# Patient Record
Sex: Male | Born: 1940
Health system: Southern US, Community
[De-identification: ages and names within clinical notes are randomized; demographics above are authoritative.]

## PROBLEM LIST (undated history)

## (undated) DIAGNOSIS — Z789 Other specified health status: Secondary | ICD-10-CM

## (undated) DIAGNOSIS — Z95828 Presence of other vascular implants and grafts: Secondary | ICD-10-CM

## (undated) DIAGNOSIS — I2510 Atherosclerotic heart disease of native coronary artery without angina pectoris: Secondary | ICD-10-CM

## (undated) HISTORY — PX: NO PAST SURGERIES: SHX2092

---

## 1998-10-02 ENCOUNTER — Emergency Department (HOSPITAL_COMMUNITY): Admission: EM | Admit: 1998-10-02 | Discharge: 1998-10-03 | Payer: Self-pay | Admitting: Emergency Medicine

## 1998-10-02 ENCOUNTER — Encounter: Payer: Self-pay | Admitting: Emergency Medicine

## 1999-08-24 ENCOUNTER — Emergency Department (HOSPITAL_COMMUNITY): Admission: EM | Admit: 1999-08-24 | Discharge: 1999-08-24 | Payer: Self-pay | Admitting: Emergency Medicine

## 1999-10-07 ENCOUNTER — Encounter: Payer: Self-pay | Admitting: Family Medicine

## 1999-10-07 ENCOUNTER — Encounter: Admission: RE | Admit: 1999-10-07 | Discharge: 1999-10-07 | Payer: Self-pay | Admitting: Family Medicine

## 2000-03-14 ENCOUNTER — Emergency Department (HOSPITAL_COMMUNITY): Admission: EM | Admit: 2000-03-14 | Discharge: 2000-03-15 | Payer: Self-pay

## 2000-05-21 ENCOUNTER — Emergency Department (HOSPITAL_COMMUNITY): Admission: EM | Admit: 2000-05-21 | Discharge: 2000-05-21 | Payer: Self-pay

## 2000-09-20 ENCOUNTER — Emergency Department (HOSPITAL_COMMUNITY): Admission: EM | Admit: 2000-09-20 | Discharge: 2000-09-20 | Payer: Self-pay | Admitting: Emergency Medicine

## 2001-10-17 ENCOUNTER — Encounter: Payer: Self-pay | Admitting: Emergency Medicine

## 2001-10-17 ENCOUNTER — Emergency Department (HOSPITAL_COMMUNITY): Admission: EM | Admit: 2001-10-17 | Discharge: 2001-10-17 | Payer: Self-pay | Admitting: Emergency Medicine

## 2002-02-05 ENCOUNTER — Emergency Department (HOSPITAL_COMMUNITY): Admission: EM | Admit: 2002-02-05 | Discharge: 2002-02-06 | Payer: Self-pay | Admitting: Emergency Medicine

## 2002-02-06 ENCOUNTER — Encounter: Payer: Self-pay | Admitting: Emergency Medicine

## 2002-12-10 ENCOUNTER — Emergency Department (HOSPITAL_COMMUNITY): Admission: EM | Admit: 2002-12-10 | Discharge: 2002-12-11 | Payer: Self-pay | Admitting: Emergency Medicine

## 2002-12-10 ENCOUNTER — Encounter: Payer: Self-pay | Admitting: Emergency Medicine

## 2003-03-28 ENCOUNTER — Emergency Department (HOSPITAL_COMMUNITY): Admission: EM | Admit: 2003-03-28 | Discharge: 2003-03-28 | Payer: Self-pay | Admitting: Emergency Medicine

## 2004-02-22 ENCOUNTER — Emergency Department (HOSPITAL_COMMUNITY): Admission: EM | Admit: 2004-02-22 | Discharge: 2004-02-22 | Payer: Self-pay

## 2004-07-31 ENCOUNTER — Emergency Department (HOSPITAL_COMMUNITY): Admission: EM | Admit: 2004-07-31 | Discharge: 2004-07-31 | Payer: Self-pay | Admitting: Emergency Medicine

## 2004-09-08 ENCOUNTER — Ambulatory Visit: Payer: Self-pay | Admitting: Internal Medicine

## 2004-12-08 ENCOUNTER — Ambulatory Visit: Payer: Self-pay | Admitting: Internal Medicine

## 2007-06-19 ENCOUNTER — Encounter: Admission: RE | Admit: 2007-06-19 | Discharge: 2007-06-19 | Payer: Self-pay | Admitting: Family Medicine

## 2008-03-09 ENCOUNTER — Emergency Department (HOSPITAL_COMMUNITY): Admission: EM | Admit: 2008-03-09 | Discharge: 2008-03-09 | Payer: Self-pay | Admitting: Emergency Medicine

## 2010-08-26 ENCOUNTER — Emergency Department (HOSPITAL_COMMUNITY)
Admission: EM | Admit: 2010-08-26 | Discharge: 2010-08-27 | Payer: Self-pay | Source: Home / Self Care | Admitting: Emergency Medicine

## 2010-12-07 LAB — POCT CARDIAC MARKERS: Troponin i, poc: <0.05 ng/mL (ref 0.00–0.09)

## 2010-12-08 LAB — DIFFERENTIAL
Basophils Relative: 0 % (ref 0–1)
Eosinophils Absolute: 0.4 10*3/uL (ref 0.0–0.7)
Monocytes Relative: 9 % (ref 3–12)
Neutrophils Relative %: 59 % (ref 43–77)

## 2010-12-08 LAB — COMPREHENSIVE METABOLIC PANEL
ALT: 23 U/L (ref 0–53)
Alkaline Phosphatase: 56 U/L (ref 39–117)
CO2: 26 mEq/L (ref 19–32)
Chloride: 104 mEq/L (ref 96–112)
GFR calc non Af Amer: >60 mL/min (ref >60)
Glucose, Bld: 126 mg/dL — ABNORMAL HIGH (ref 70–99)
Potassium: 5.2 mEq/L — ABNORMAL HIGH (ref 3.5–5.1)
Sodium: 138 mEq/L (ref 135–145)
Total Protein: 7.1 g/dL (ref 6.0–8.3)

## 2010-12-08 LAB — CBC
HCT: 40.1 % (ref 39.0–52.0)
Hemoglobin: 14.1 g/dL (ref 13.0–17.0)
RBC: 4.65 MIL/uL (ref 4.22–5.81)
WBC: 8.4 10*3/uL (ref 4.0–10.5)

## 2011-09-01 ENCOUNTER — Emergency Department (HOSPITAL_COMMUNITY)
Admission: EM | Admit: 2011-09-01 | Discharge: 2011-09-02 | Disposition: A | Discharge status: Home / Self Care | Payer: Medicare Other | Attending: Emergency Medicine | Admitting: Emergency Medicine

## 2011-09-01 ENCOUNTER — Encounter: Payer: Self-pay | Admitting: Emergency Medicine

## 2011-09-01 DIAGNOSIS — R079 Chest pain, unspecified: Secondary | ICD-10-CM | POA: Insufficient documentation

## 2011-09-01 DIAGNOSIS — I10 Essential (primary) hypertension: Secondary | ICD-10-CM | POA: Insufficient documentation

## 2011-09-01 DIAGNOSIS — R0789 Other chest pain: Secondary | ICD-10-CM

## 2011-09-01 DIAGNOSIS — M549 Dorsalgia, unspecified: Secondary | ICD-10-CM | POA: Insufficient documentation

## 2011-09-01 NOTE — ED Notes (Signed)
Pt alert, c/o high blood pressure, onset this evening, states hasnt been feeling right so he was checking it at home, denies chest pain, denies sob, c/o mild h/a, ambulates to triage, steady gait noted

## 2011-09-02 ENCOUNTER — Other Ambulatory Visit: Payer: Self-pay

## 2011-09-02 ENCOUNTER — Emergency Department (HOSPITAL_COMMUNITY): Payer: Medicare Other

## 2011-09-02 LAB — BASIC METABOLIC PANEL
BUN: 19 mg/dL (ref 6–23)
CO2: 27 mEq/L (ref 19–32)
Chloride: 101 mEq/L (ref 96–112)
Creatinine, Ser: 1.32 mg/dL (ref 0.50–1.35)
Glucose, Bld: 105 mg/dL — ABNORMAL HIGH (ref 70–99)

## 2011-09-02 LAB — CARDIAC PANEL(CRET KIN+CKTOT+MB+TROPI)
Relative Index: 1.8 (ref 0.0–2.5)
Troponin I: <0.3 ng/mL (ref <0.30)
Troponin I: <0.3 ng/mL (ref <0.30)

## 2011-09-02 LAB — CBC
MCH: 29.9 pg (ref 26.0–34.0)
MCV: 86.6 fL (ref 78.0–100.0)
Platelets: 344 10*3/uL (ref 150–400)
RDW: 12.3 % (ref 11.5–15.5)
WBC: 9.6 10*3/uL (ref 4.0–10.5)

## 2011-09-02 MED ORDER — NITROGLYCERIN 0.4 MG SL SUBL: 0.4000 mg | SUBLINGUAL_TABLET | SUBLINGUAL | Status: DC | PRN

## 2011-09-02 MED ORDER — METOPROLOL TARTRATE 12.5 MG HALF TABLET: 12.5000 mg | ORAL_TABLET | Freq: Two times a day (BID) | ORAL | Status: DC

## 2011-09-02 MED ORDER — METOPROLOL TARTRATE 25 MG PO TABS
12.5000 mg | ORAL_TABLET | Freq: Once | ORAL | Status: AC
  Administered 2011-09-02: 12.5 mg via ORAL
  Filled 2011-09-02 (×2): qty 1

## 2011-09-02 NOTE — ED Notes (Signed)
Pt refused lopressor. Pt told the importance of taking medication. Pt stated he thought he may get hooked on medication. Told pt that this would not happen with this medication. Md made aware.

## 2011-09-02 NOTE — ED Notes (Signed)
Pt states that he has been having reproducible back pain and some mild chest wall pain and an episode of blood pressure that was earlier this evening. States that when he took his BP it was 190/86. Bp at this time is 175/76, pulse 63, O2 100%. Patient states pain has been off and on for 2 days- that he has tried to get an appointment with his regular MD but couldn't.

## 2011-09-02 NOTE — ED Notes (Signed)
Pt here with c/o HTN. Checked his Bp at home and it was 190's systolic and he decided to come get evaluated. Wife at bedside

## 2011-09-02 NOTE — ED Provider Notes (Signed)
History     CSN: [id number] Arrival date & time: 09/01/2011 11:50 PM   First MD Initiated Contact with Patient 09/02/11 0111      Chief Complaint  Patient presents with  . Hypertension    (Consider location/radiation/quality/duration/timing/severity/associated sxs/prior treatment) HPI 70 year old male presents to emergency department with complaint of elevated blood pressure tonight. Patient reports he was not feeling well and took his blood pressure and it was 190/86. Patient denies prior history of hypertension. Patient reports he's been having upper back pain and chest pain for the last 2-3 days. Back pain chest pain occur when he conscious his shoulders forward. He does report he has had twinges of left upper chest pain not with hunching and shoulders. Patient denies prior coronary artery disease. Patient reports he braked leaves last week. He has had previous symptoms before, prior review of visits show similar presentation about a year ago for which she left the emergency department AMA. Patient reports stress test 2-3 years ago during workup of intermittent atrial fibrillation. Patient takes no medications and reports his A. fib resolved on tone. Patient sister recently had a heart catheterization and a stent placed. He had an EKG done at his doctor's office last week due to this history. Patient denies any headache dizziness change in vision or focal neuro deficits. No abdominal pain no change in urinary habits no weakness. Patient denies any leg swelling prolonged immobilization or other risk factors for PE History reviewed. No pertinent past medical history.  History reviewed. No pertinent past surgical history.  No family history on file.  History  Substance Use Topics  . Smoking status: Not on file  . Smokeless tobacco: Not on file  . Alcohol Use:       Review of Systems  All other systems reviewed and are negative.    Allergies  Review of patient's allergies  indicates no known allergies.  Home Medications   Current Outpatient Rx  Name Route Sig Dispense Refill  . METOPROLOL TARTRATE 12.5 MG HALF TABLET Oral Take 0.5 tablets (12.5 mg total) by mouth 2 (two) times daily. 30 tablet 0  . NITROGLYCERIN 0.4 MG SL SUBL Sublingual Place 1 tablet (0.4 mg total) under the tongue every 5 (five) minutes as needed for chest pain (up to three doses, if pain continues go to the ER). 15 tablet 0    BP 161/83  Pulse 73  Temp(Src) 98 F (36.7 C) (Oral)  Resp 16  Wt 180 lb (81.647 kg)  SpO2 98%  Physical Exam  Nursing note and vitals reviewed. Constitutional: He is oriented to person, place, and time. He appears well-developed and well-nourished.  HENT:  Head: Normocephalic and atraumatic.  Nose: Nose normal.  Mouth/Throat: Oropharynx is clear and moist.  Eyes: Conjunctivae and EOM are normal. Pupils are equal, round, and reactive to light.  Neck: Normal range of motion. Neck supple. No JVD present. No tracheal deviation present. No thyromegaly present.  Cardiovascular: Normal rate, regular rhythm, normal heart sounds and intact distal pulses.  Exam reveals no gallop and no friction rub.   No murmur heard. Pulmonary/Chest: Effort normal and breath sounds normal. No stridor. No respiratory distress. He has no wheezes. He has no rales. He exhibits no tenderness.  Abdominal: Soft. Bowel sounds are normal. He exhibits no distension and no mass. There is no tenderness. There is no rebound and no guarding.  Musculoskeletal: Normal range of motion. He exhibits no edema and no tenderness.  Lymphadenopathy:    He has  no cervical adenopathy.  Neurological: He is oriented to person, place, and time. He exhibits normal muscle tone. Coordination normal.  Skin: Skin is dry. No rash noted. No erythema. No pallor.  Psychiatric: He has a normal mood and affect. His behavior is normal. Judgment and thought content normal.    ED Course  Procedures (including critical  care time)  Labs Reviewed  BASIC METABOLIC PANEL - Abnormal; Notable for the following:    Potassium 3.2 (*)    Glucose, Bld 105 (*)    GFR calc non Af Amer 52 (*)    GFR calc Af Amer 61 (*)    All other components within normal limits  D-DIMER, QUANTITATIVE - Abnormal; Notable for the following:    D-Dimer, Quant 0.57 (*)    All other components within normal limits  CBC  CARDIAC PANEL(CRET KIN+CKTOT+MB+TROPI)  CARDIAC PANEL(CRET KIN+CKTOT+MB+TROPI)   Dg Chest 2 View  09/02/2011  *RADIOLOGY REPORT*  Clinical Data: Chest pain, back pain and hypertension.  CHEST - 2 VIEW  Comparison: Chest radiograph performed 08/26/2010  Findings: The lungs are well-aerated and clear.  There is no evidence of focal opacification, pleural effusion or pneumothorax. Bilateral nipple shadows are noted.  The heart is normal in size; the mediastinal contour is within normal limits.  No acute osseous abnormalities are seen.  IMPRESSION: No acute cardiopulmonary process seen.  Original Report Authenticated By: Tonia Ghent, M.D.    Date: 09/02/2011  Rate: 61  Rhythm: normal sinus rhythm  QRS Axis: normal  Intervals: normal  ST/T Wave abnormalities: normal  Conduction Disutrbances:incomplete RBBB  Narrative Interpretation:   Old EKG Reviewed: none available   1. Hypertension   2. Atypical chest pain       MDM     70 yo male with new hypertension, occasional cp which seems atypical in nature.  Ddimer slightly elevated, but pt with normal hr, no tachypnea, no hypoxia, no risk factors for PE.  D/w Dr Antoine Poche regarding htn, family h/o cad, and atypical chest pain.  He will have [facility] cardiology call for office visit, recommends lopressor and ntg prn.  Pt does not wish admission, and does not want to take medications.  Advised of results, to f/u with pcm and cardiology.     Olivia Mackie, MD 09/02/11 [phone]

## 2011-09-02 NOTE — ED Notes (Signed)
Pt discharged. Pt decided to wanted to take his lopressor . Medication given. Pt discharged in stable condition.

## 2011-09-02 NOTE — ED Notes (Signed)
Recollected blood specimens 

## 2012-01-31 DIAGNOSIS — M546 Pain in thoracic spine: Secondary | ICD-10-CM | POA: Diagnosis not present

## 2012-02-02 ENCOUNTER — Encounter (HOSPITAL_COMMUNITY): Payer: Self-pay | Admitting: Emergency Medicine

## 2012-02-02 ENCOUNTER — Encounter (HOSPITAL_COMMUNITY)
Admission: EM | Disposition: A | Discharge status: Home Health | Payer: Self-pay | Source: Home / Self Care | Attending: Cardiology

## 2012-02-02 ENCOUNTER — Ambulatory Visit (HOSPITAL_COMMUNITY): Admit: 2012-02-02 | Payer: Self-pay | Admitting: Cardiology

## 2012-02-02 ENCOUNTER — Inpatient Hospital Stay (HOSPITAL_COMMUNITY)
Admission: EM | Admit: 2012-02-02 | Discharge: 2012-02-04 | DRG: 247 | Disposition: A | Discharge status: Home / Self Care | Payer: Medicare Other | Attending: Cardiology | Admitting: Cardiology

## 2012-02-02 ENCOUNTER — Emergency Department (HOSPITAL_COMMUNITY): Payer: Medicare Other

## 2012-02-02 DIAGNOSIS — I2510 Atherosclerotic heart disease of native coronary artery without angina pectoris: Secondary | ICD-10-CM | POA: Diagnosis present

## 2012-02-02 DIAGNOSIS — E7800 Pure hypercholesterolemia, unspecified: Secondary | ICD-10-CM | POA: Diagnosis not present

## 2012-02-02 DIAGNOSIS — I219 Acute myocardial infarction, unspecified: Secondary | ICD-10-CM | POA: Diagnosis not present

## 2012-02-02 DIAGNOSIS — Z87891 Personal history of nicotine dependence: Secondary | ICD-10-CM

## 2012-02-02 DIAGNOSIS — E876 Hypokalemia: Secondary | ICD-10-CM | POA: Diagnosis not present

## 2012-02-02 DIAGNOSIS — Z955 Presence of coronary angioplasty implant and graft: Secondary | ICD-10-CM

## 2012-02-02 DIAGNOSIS — I213 ST elevation (STEMI) myocardial infarction of unspecified site: Secondary | ICD-10-CM

## 2012-02-02 DIAGNOSIS — I2109 ST elevation (STEMI) myocardial infarction involving other coronary artery of anterior wall: Secondary | ICD-10-CM

## 2012-02-02 DIAGNOSIS — R079 Chest pain, unspecified: Secondary | ICD-10-CM | POA: Diagnosis not present

## 2012-02-02 HISTORY — PX: LEFT HEART CATH: SHX5478

## 2012-02-02 HISTORY — DX: Other specified health status: Z78.9

## 2012-02-02 LAB — DIFFERENTIAL
Basophils Absolute: 0 10*3/uL (ref 0.0–0.1)
Basophils Relative: 0 % (ref 0–1)
Eosinophils Absolute: 0.2 10*3/uL (ref 0.0–0.7)
Monocytes Relative: 10 % (ref 3–12)
Neutro Abs: 6.5 10*3/uL (ref 1.7–7.7)
Neutrophils Relative %: 57 % (ref 43–77)

## 2012-02-02 LAB — POCT I-STAT, CHEM 8
BUN: 23 mg/dL (ref 6–23)
Calcium, Ion: 1.1 mmol/L — ABNORMAL LOW (ref 1.12–1.32)
Chloride: 105 mEq/L (ref 96–112)
Creatinine, Ser: 1.3 mg/dL (ref 0.50–1.35)
Glucose, Bld: 103 mg/dL — ABNORMAL HIGH (ref 70–99)
TCO2: 27 mmol/L (ref 0–100)

## 2012-02-02 LAB — CBC
Hemoglobin: 14.1 g/dL (ref 13.0–17.0)
MCH: 30.2 pg (ref 26.0–34.0)
MCHC: 34.5 g/dL (ref 30.0–36.0)
Platelets: 337 10*3/uL (ref 150–400)

## 2012-02-02 LAB — PROTIME-INR
INR: 0.92 (ref 0.00–1.49)
Prothrombin Time: 12.6 seconds (ref 11.6–15.2)

## 2012-02-02 LAB — CARDIAC PANEL(CRET KIN+CKTOT+MB+TROPI): Total CK: 127 U/L (ref 7–232)

## 2012-02-02 LAB — POCT I-STAT TROPONIN I: Troponin i, poc: 0.17 ng/mL (ref 0.00–0.08)

## 2012-02-02 SURGERY — LEFT HEART CATH
Anesthesia: LOCAL

## 2012-02-02 MED ORDER — ASPIRIN 81 MG PO CHEW
324.0000 mg | CHEWABLE_TABLET | Freq: Once | ORAL | Status: AC
  Administered 2012-02-02: 324 mg via ORAL
  Filled 2012-02-02: qty 2

## 2012-02-02 MED ORDER — HEPARIN BOLUS VIA INFUSION
4000.0000 [IU] | Freq: Once | INTRAVENOUS | Status: AC
  Administered 2012-02-02: 4000 [IU] via INTRAVENOUS

## 2012-02-02 MED ORDER — HEPARIN (PORCINE) IN NACL 2-0.9 UNIT/ML-% IJ SOLN
INTRAMUSCULAR | Status: AC
  Filled 2012-02-02: qty 2000

## 2012-02-02 MED ORDER — BIVALIRUDIN 250 MG IV SOLR
INTRAVENOUS | Status: AC
  Filled 2012-02-02: qty 250

## 2012-02-02 MED ORDER — NITROGLYCERIN 0.2 MG/ML ON CALL CATH LAB
INTRAVENOUS | Status: AC
  Filled 2012-02-02: qty 1

## 2012-02-02 MED ORDER — MIDAZOLAM HCL 2 MG/2ML IJ SOLN
INTRAMUSCULAR | Status: AC
  Filled 2012-02-02: qty 2

## 2012-02-02 MED ORDER — NITROGLYCERIN 0.4 MG SL SUBL
0.4000 mg | SUBLINGUAL_TABLET | SUBLINGUAL | Status: DC | PRN
  Administered 2012-02-02: 0.4 mg via SUBLINGUAL

## 2012-02-02 MED ORDER — HEPARIN (PORCINE) IN NACL 100-0.45 UNIT/ML-% IJ SOLN
INTRAMUSCULAR | Status: AC
  Administered 2012-02-02: 25000 [IU]
  Filled 2012-02-02: qty 250

## 2012-02-02 MED ORDER — NITROGLYCERIN 0.4 MG SL SUBL
SUBLINGUAL_TABLET | SUBLINGUAL | Status: AC
  Administered 2012-02-02: 0.4 mg via SUBLINGUAL
  Filled 2012-02-02: qty 25

## 2012-02-02 MED ORDER — ASPIRIN 81 MG PO CHEW
CHEWABLE_TABLET | ORAL | Status: AC
  Administered 2012-02-03: 81 mg
  Filled 2012-02-02: qty 2

## 2012-02-02 MED ORDER — FENTANYL CITRATE 0.05 MG/ML IJ SOLN
INTRAMUSCULAR | Status: AC
Start: 2012-02-02 — End: 2012-02-02
  Filled 2012-02-02: qty 2

## 2012-02-02 MED ORDER — LIDOCAINE HCL (PF) 1 % IJ SOLN
INTRAMUSCULAR | Status: AC
  Filled 2012-02-02: qty 30

## 2012-02-02 NOTE — ED Notes (Signed)
Patient complain mid sternal CP started today. Patient seen PCP today after CP and was given GI-cocktail and CP and back pain resolved. Patient reports that CP started again at 7:45 pm and patient reports that pain stop while on his way to ER.

## 2012-02-02 NOTE — ED Provider Notes (Signed)
History     CSN: [id number]  Arrival date & time 02/02/12  1947   First MD Initiated Contact with Patient 02/02/12 2233      Chief Complaint  Patient presents with  . Chest Pain    (Consider location/radiation/quality/duration/timing/severity/associated sxs/prior treatment) The history is provided by the patient and the spouse. The history is limited by the condition of the patient.   the patient is a 71 year old, male, with no significant past medical history.  He denies smoking.  Cigarettes.  He was seen by his physician earlier today.  For indigestion type symptoms.  He was given an antacid, and released.  Approximately 30 minutes ago.  He developed chest pain, again.  He denies nausea, shortness of breath, or sweating.  EKG shows an anterior wall MI.  History reviewed. No pertinent past medical history.  History reviewed. No pertinent past surgical history.  History reviewed. No pertinent family history.  History  Substance Use Topics  . Smoking status: Never Smoker   . Smokeless tobacco: Not on file  . Alcohol Use: No      Review of Systems  Unable to perform ROS  level V caveat, for STEMI  Allergies  Review of patient's allergies indicates no known allergies.  Home Medications   Current Outpatient Rx  Name Route Sig Dispense Refill  . ASPIRIN 325 MG PO TABS Oral Take 325 mg by mouth daily.    Marland Kitchen CALCIUM CARBONATE 600 MG PO TABS Oral Take 600 mg by mouth daily.    Marland Kitchen VITAMIN D 1000 UNITS PO TABS Oral Take 1,000 Units by mouth daily.    . ADULT MULTIVITAMIN W/MINERALS CH Oral Take 1 tablet by mouth daily.    Marland Kitchen NAPROXEN SODIUM 220 MG PO TABS Oral Take 220 mg by mouth 2 (two) times daily as needed. For pain.    Marland Kitchen NITROGLYCERIN 0.4 MG SL SUBL Sublingual Place 1 tablet (0.4 mg total) under the tongue every 5 (five) minutes as needed for chest pain (up to three doses, if pain continues go to the ER). 15 tablet 0  . VITAMIN C 500 MG PO TABS Oral Take 500 mg by mouth  daily.      BP 163/76  Pulse 63  Temp(Src) 97.9 F (36.6 C) (Oral)  Resp 18  SpO2 99%  Physical Exam  Vitals reviewed. Constitutional: He is oriented to person, place, and time. He appears well-developed and well-nourished. He appears distressed.       Pale and anxious  HENT:  Head: Normocephalic and atraumatic.  Eyes: Conjunctivae are normal.  Neck: Normal range of motion. Neck supple.  Cardiovascular: Normal rate.   No murmur heard. Pulmonary/Chest: Effort normal and breath sounds normal. No respiratory distress.  Abdominal: Soft. He exhibits no distension. There is no tenderness.  Musculoskeletal: Normal range of motion. He exhibits no edema and no tenderness.  Neurological: He is alert and oriented to person, place, and time.  Skin: Skin is warm and dry. There is pallor.  Psychiatric:       Anxious    ED Course  Procedures (including critical care time) Anterior wall MI on EKG Aspirin, heparin and nitroglycerin given Code STEMI activated and patient transferred to the Cath Lab  Labs Reviewed  POCT I-STAT, CHEM 8 - Abnormal; Notable for the following:    Glucose, Bld 103 (*)    Calcium, Ion 1.10 (*)    All other components within normal limits  CBC  DIFFERENTIAL  PROTIME-INR  CARDIAC PANEL(CRET KIN+CKTOT+MB+TROPI)  Dg Chest Port 1 View  02/02/2012  *RADIOLOGY REPORT*  Clinical Data: Acute myocardial infarction.  PORTABLE CHEST - 1 VIEW  Comparison: 09/02/2011  Findings: 2245 hours.  Lungs are clear. The cardiopericardial silhouette is within normal limits for size.  The patient is rotated slightly to the right which displaces the cardiomediastinal structures in the medial right hemithorax.  Degenerative changes are seen in the right glenohumeral joint. Telemetry leads overlie the chest.  IMPRESSION: Stable.  No acute cardiopulmonary findings.  Original Report Authenticated By: ERIC A. MANSELL, M.D.     No diagnosis found.    MDM  Anterior wall  MI        Cheri Guppy, MD 02/02/12 2326

## 2012-02-02 NOTE — ED Notes (Signed)
Patient complain of mid-sternal CP and SOB. Patient rates CP 10/10 on numerical pain scale. Repeat EKG done and given to Dr. Weldon Inches. Dr. Weldon Inches at patient bedside and new orders received.

## 2012-02-03 ENCOUNTER — Encounter (HOSPITAL_COMMUNITY): Payer: Self-pay | Admitting: *Deleted

## 2012-02-03 DIAGNOSIS — I2109 ST elevation (STEMI) myocardial infarction involving other coronary artery of anterior wall: Secondary | ICD-10-CM | POA: Diagnosis not present

## 2012-02-03 DIAGNOSIS — E7800 Pure hypercholesterolemia, unspecified: Secondary | ICD-10-CM | POA: Diagnosis not present

## 2012-02-03 DIAGNOSIS — I219 Acute myocardial infarction, unspecified: Secondary | ICD-10-CM | POA: Diagnosis not present

## 2012-02-03 LAB — DIFFERENTIAL
Basophils Absolute: 0 10*3/uL (ref 0.0–0.1)
Basophils Relative: 0 % (ref 0–1)
Eosinophils Relative: 2 % (ref 0–5)
Lymphocytes Relative: 23 % (ref 12–46)
Monocytes Absolute: 0.7 10*3/uL (ref 0.1–1.0)

## 2012-02-03 LAB — LIPID PANEL
Cholesterol: 165 mg/dL (ref 0–200)
Total CHOL/HDL Ratio: 3.8 RATIO

## 2012-02-03 LAB — CARDIAC PANEL(CRET KIN+CKTOT+MB+TROPI)
CK, MB: 3.2 ng/mL (ref 0.3–4.0)
CK, MB: 34.1 ng/mL (ref 0.3–4.0)
Total CK: 98 U/L (ref 7–232)
Troponin I: 0.65 ng/mL (ref <0.30)
Troponin I: 5.32 ng/mL (ref <0.30)

## 2012-02-03 LAB — CBC
Platelets: 258 10*3/uL (ref 150–400)
RDW: 12.7 % (ref 11.5–15.5)
WBC: 8.1 10*3/uL (ref 4.0–10.5)

## 2012-02-03 LAB — COMPREHENSIVE METABOLIC PANEL
Albumin: 2.6 g/dL — ABNORMAL LOW (ref 3.5–5.2)
BUN: 18 mg/dL (ref 6–23)
Chloride: 106 mEq/L (ref 96–112)
Creatinine, Ser: 0.99 mg/dL (ref 0.50–1.35)
GFR calc Af Amer: >90 mL/min (ref >90)
Total Bilirubin: 0.5 mg/dL (ref 0.3–1.2)

## 2012-02-03 LAB — MAGNESIUM: Magnesium: 1.7 mg/dL (ref 1.5–2.5)

## 2012-02-03 LAB — PROTIME-INR
INR: 1.1 (ref 0.00–1.49)
Prothrombin Time: 14.4 seconds (ref 11.6–15.2)

## 2012-02-03 LAB — MRSA PCR SCREENING: MRSA by PCR: NEGATIVE

## 2012-02-03 MED ORDER — ATORVASTATIN CALCIUM 80 MG PO TABS
80.0000 mg | ORAL_TABLET | Freq: Every day | ORAL | Status: DC
  Administered 2012-02-03: 80 mg via ORAL
  Filled 2012-02-03 (×2): qty 1

## 2012-02-03 MED ORDER — NITROGLYCERIN IN D5W 200-5 MCG/ML-% IV SOLN
5.0000 ug/min | INTRAVENOUS | Status: DC
  Administered 2012-02-03: 5 ug/min via INTRAVENOUS
  Filled 2012-02-03: qty 250

## 2012-02-03 MED ORDER — POTASSIUM CHLORIDE CRYS ER 20 MEQ PO TBCR
40.0000 meq | EXTENDED_RELEASE_TABLET | Freq: Once | ORAL | Status: AC
  Administered 2012-02-03: 40 meq via ORAL
  Filled 2012-02-03: qty 2

## 2012-02-03 MED ORDER — ONDANSETRON HCL 4 MG/2ML IJ SOLN: 4.0000 mg | Freq: Four times a day (QID) | INTRAMUSCULAR | Status: DC | PRN

## 2012-02-03 MED ORDER — PANTOPRAZOLE SODIUM 40 MG PO TBEC
40.0000 mg | DELAYED_RELEASE_TABLET | Freq: Every day | ORAL | Status: DC
  Administered 2012-02-03 – 2012-02-04 (×2): 40 mg via ORAL
  Filled 2012-02-03: qty 1

## 2012-02-03 MED ORDER — EPTIFIBATIDE 75 MG/100ML IV SOLN
2.0000 ug/kg/min | INTRAVENOUS | Status: DC
  Administered 2012-02-03: 2 ug/kg/min via INTRAVENOUS
  Filled 2012-02-03 (×3): qty 100

## 2012-02-03 MED ORDER — ASPIRIN 81 MG PO CHEW
81.0000 mg | CHEWABLE_TABLET | Freq: Every day | ORAL | Status: DC
  Administered 2012-02-03 – 2012-02-04 (×2): 81 mg via ORAL
  Filled 2012-02-03 (×2): qty 1

## 2012-02-03 MED ORDER — ACETAMINOPHEN 325 MG PO TABS: 650.0000 mg | ORAL_TABLET | ORAL | Status: DC | PRN

## 2012-02-03 MED ORDER — NITROGLYCERIN 0.4 MG SL SUBL: 0.4000 mg | SUBLINGUAL_TABLET | SUBLINGUAL | Status: DC | PRN

## 2012-02-03 MED ORDER — ASPIRIN EC 81 MG PO TBEC: 81.0000 mg | DELAYED_RELEASE_TABLET | Freq: Every day | ORAL | Status: DC

## 2012-02-03 MED ORDER — ATROPINE SULFATE 1 MG/ML IJ SOLN
INTRAMUSCULAR | Status: AC
  Filled 2012-02-03: qty 1

## 2012-02-03 MED ORDER — EPTIFIBATIDE 75 MG/100ML IV SOLN: 2.0000 ug/kg/min | INTRAVENOUS | Status: AC

## 2012-02-03 MED ORDER — ASPIRIN 81 MG PO CHEW: 324.0000 mg | CHEWABLE_TABLET | ORAL | Status: DC

## 2012-02-03 MED ORDER — ASPIRIN 300 MG RE SUPP: 300.0000 mg | RECTAL | Status: DC

## 2012-02-03 MED ORDER — TICAGRELOR 90 MG PO TABS
90.0000 mg | ORAL_TABLET | Freq: Two times a day (BID) | ORAL | Status: DC
  Administered 2012-02-03 – 2012-02-04 (×3): 90 mg via ORAL
  Filled 2012-02-03 (×5): qty 1

## 2012-02-03 MED ORDER — SODIUM CHLORIDE 0.9 % IV SOLN
INTRAVENOUS | Status: AC
  Administered 2012-02-03 (×2): via INTRAVENOUS

## 2012-02-03 MED ORDER — METOPROLOL TARTRATE 12.5 MG HALF TABLET
12.5000 mg | ORAL_TABLET | Freq: Two times a day (BID) | ORAL | Status: DC
  Administered 2012-02-03 – 2012-02-04 (×2): 12.5 mg via ORAL
  Filled 2012-02-03 (×4): qty 1

## 2012-02-03 MED FILL — Dextrose Inj 5%: INTRAVENOUS | Qty: 50 | Status: AC

## 2012-02-03 NOTE — Progress Notes (Signed)
ANTICOAGULATION CONSULT NOTE - Initial Consult  Pharmacy Consult for integrelin Indication: s/p PCA  No Known Allergies  Patient Measurements: Height: 5\' 9"  (175.3 cm) Weight: 177 lb 4 oz (80.4 kg) IBW/kg (Calculated) : 70.7  Heparin Dosing Weight:   Vital Signs: Temp: 98 F (36.7 C) (05/09 0045) Temp src: Oral (05/09 0045) BP: 121/60 mmHg (05/09 0045) Pulse Rate: 56  (05/09 0045)  Labs:  Basename 02/02/12 2246 02/02/12 2245  HGB 14.3 14.1  HCT 42.0 40.9  PLT -- 337  APTT -- --  LABPROT -- 12.6  INR -- 0.92  HEPARINUNFRC -- --  CREATININE 1.30 --  CKTOTAL -- 127  CKMB -- 3.2  TROPONINI -- <0.30    Estimated Creatinine Clearance: 50.6 ml/min (by C-G formula based on Cr of 1.3).   Medical History: Past Medical History  Diagnosis Date  . No pertinent past medical history     Medications:  Prescriptions prior to admission  Medication Sig Dispense Refill  . aspirin 325 MG tablet Take 325 mg by mouth daily.      . calcium carbonate (OS-CAL) 600 MG TABS Take 600 mg by mouth daily.      . cholecalciferol (VITAMIN D) 1000 UNITS tablet Take 1,000 Units by mouth daily.      . Multiple Vitamin (MULITIVITAMIN WITH MINERALS) TABS Take 1 tablet by mouth daily.      . naproxen sodium (ANAPROX) 220 MG tablet Take 220 mg by mouth 2 (two) times daily as needed. For pain.      . nitroGLYCERIN (NITROSTAT) 0.4 MG SL tablet Place 1 tablet (0.4 mg total) under the tongue every 5 (five) minutes as needed for chest pain (up to three doses, if pain continues go to the ER).  15 tablet  0  . vitamin C (ASCORBIC ACID) 500 MG tablet Take 500 mg by mouth daily.        Assessment: 71 yo s/p cath to continue integrelin for platelet inhibition. Crcl=65ml/min pt wt 80 kg.  Goal of Therapy:  21mcg/kg/min  Monitor platelets by anticoagulation protocol: Yes      Plan:  Integrelin at 24mcg/kg/min until dc. plt ct with am labs.   Janice Coffin 02/03/2012,1:19 AM

## 2012-02-03 NOTE — Progress Notes (Signed)
Report from Night RN. Chart reviewed together. Handoff complete.  

## 2012-02-03 NOTE — Care Management Note (Signed)
  Page 1 of 1   02/03/2012     11:31:20 AM   CARE MANAGEMENT NOTE 02/03/2012  Patient:  Philip Parsons, Philip Parsons   Account Number:  [id number]  Date Initiated:  02/03/2012  Documentation initiated by:  Junius Creamer  Subjective/Objective Assessment:   adm w mi     Action/Plan:   lives w wife, pcp dr Manus Gunning   Anticipated DC Date:  02/05/2012   Anticipated DC Plan:  HOME/SELF CARE      DC Planning Services  CM consult      Choice offered to / List presented to:             Status of service:   Medicare Important Message given?   (If response is "NO", the following Medicare IM given date fields will be blank) Date Medicare IM given:   Date Additional Medicare IM given:    Discharge Disposition:    Per UR Regulation:    If discussed at Long Length of Stay Meetings, dates discussed:    Comments:  5/9 11:30a debbie Leydi Winstead rn,bsn [phone]

## 2012-02-03 NOTE — Progress Notes (Signed)
Sheath removed from groin at this time. ACT at 03:20 was 149. Manual pressure held for 20 minutes. Site level 0. Vital signs WNL. Patient tolerated procedure well. Pressure dressing applied and post sheath removal instructions given. Will continue to monitor patient.

## 2012-02-03 NOTE — CV Procedure (Signed)
Left cath/PTCA stent report dictated on 02/03/2012 dictation number is [id number]

## 2012-02-03 NOTE — H&P (Signed)
AZARI HASLER is an 71 y.o. male.   Chief Complaint: Chest pain HPI: Patient is 72 year old male with past medical history significant for remote tobacco abuse positive family history of coronary artery disease her growth to [facility] ER complaining of recurrent retrosternal chest pain which started approximately 4:00 this afternoon went to see his PMD had EKG done which was normal received GI cocktail pain resolved in 20-30 minutes her when home had similar chest pain more severe grade 10 over 10 radiating to the back her growth to [facility] ER had EKG done in ER showed normal sinus rhythm with no significant ischemic changes. While waiting in the ER patient alert again severe chest pain a repeat EKG done in the ER showed normal sinus rhythm with the more than 5 mm ST elevation in anterolateral leads her. Her course time he was called and patient was transferred to [facility] for further management. Patient denies such episodes of chest pain in the past denies any history of exertional her chest pain denies any nausea or vomiting sweating. Denies any palpitation lightheadedness or syncope. Denies any cardiac workup in the past. Patient states he occasionally takes aspirin and ibuprofen off and on.  History reviewed. No pertinent past medical history.  History reviewed. No pertinent past surgical history.  History reviewed. No pertinent family history. Social History:  reports that he has never smoked. He does not have any smokeless tobacco history on file. He reports that he does not drink alcohol or use illicit drugs.  Allergies: No Known Allergies  Medications Prior to Admission  Medication Sig Dispense Refill  . aspirin 325 MG tablet Take 325 mg by mouth daily.      . calcium carbonate (OS-CAL) 600 MG TABS Take 600 mg by mouth daily.      . cholecalciferol (VITAMIN D) 1000 UNITS tablet Take 1,000 Units by mouth daily.      . Multiple Vitamin (MULITIVITAMIN WITH MINERALS) TABS Take  1 tablet by mouth daily.      . naproxen sodium (ANAPROX) 220 MG tablet Take 220 mg by mouth 2 (two) times daily as needed. For pain.      . nitroGLYCERIN (NITROSTAT) 0.4 MG SL tablet Place 1 tablet (0.4 mg total) under the tongue every 5 (five) minutes as needed for chest pain (up to three doses, if pain continues go to the ER).  15 tablet  0  . vitamin C (ASCORBIC ACID) 500 MG tablet Take 500 mg by mouth daily.        Results for orders placed during the hospital encounter of 02/02/12 (from the past 48 hour(s))  POCT I-STAT TROPONIN I     Status: Abnormal   Collection Time   02/02/12 10:44 PM      Component Value Range Comment   Troponin i, poc 0.17 (*) 0.00 - 0.08 (ng/mL)    Comment NOTIFIED PHYSICIAN      Comment 3            CBC     Status: Abnormal   Collection Time   02/02/12 10:45 PM      Component Value Range Comment   WBC 11.3 (*) 4.0 - 10.5 (K/uL)    RBC 4.67  4.22 - 5.81 (MIL/uL)    Hemoglobin 14.1  13.0 - 17.0 (g/dL)    HCT 16.1  09.6 - 04.5 (%)    MCV 87.6  78.0 - 100.0 (fL)    MCH 30.2  26.0 - 34.0 (pg)  MCHC 34.5  30.0 - 36.0 (g/dL)    RDW 16.1  09.6 - 04.5 (%)    Platelets 337  150 - 400 (K/uL)   DIFFERENTIAL     Status: Abnormal   Collection Time   02/02/12 10:45 PM      Component Value Range Comment   Neutrophils Relative 57  43 - 77 (%)    Neutro Abs 6.5  1.7 - 7.7 (K/uL)    Lymphocytes Relative 31  12 - 46 (%)    Lymphs Abs 3.5  0.7 - 4.0 (K/uL)    Monocytes Relative 10  3 - 12 (%)    Monocytes Absolute 1.1 (*) 0.1 - 1.0 (K/uL)    Eosinophils Relative 2  0 - 5 (%)    Eosinophils Absolute 0.2  0.0 - 0.7 (K/uL)    Basophils Relative 0  0 - 1 (%)    Basophils Absolute 0.0  0.0 - 0.1 (K/uL)   PROTIME-INR     Status: Normal   Collection Time   02/02/12 10:45 PM      Component Value Range Comment   Prothrombin Time 12.6  11.6 - 15.2 (seconds)    INR 0.92  0.00 - 1.49    CARDIAC PANEL(CRET KIN+CKTOT+MB+TROPI)     Status: Normal   Collection Time   02/02/12 10:45  PM      Component Value Range Comment   Total CK 127  7 - 232 (U/L)    CK, MB 3.2  0.3 - 4.0 (ng/mL)    Troponin I <0.30  <0.30 (ng/mL)    Relative Index 2.5  0.0 - 2.5    POCT I-STAT, CHEM 8     Status: Abnormal   Collection Time   02/02/12 10:46 PM      Component Value Range Comment   Sodium 141  135 - 145 (mEq/L)    Potassium 3.6  3.5 - 5.1 (mEq/L)    Chloride 105  96 - 112 (mEq/L)    BUN 23  6 - 23 (mg/dL)    Creatinine, Ser 4.09  0.50 - 1.35 (mg/dL)    Glucose, Bld 811 (*) 70 - 99 (mg/dL)    Calcium, Ion 9.14 (*) 1.12 - 1.32 (mmol/L)    TCO2 27  0 - 100 (mmol/L)    Hemoglobin 14.3  13.0 - 17.0 (g/dL)    HCT 78.2  95.6 - 21.3 (%)    Dg Chest Port 1 View  02/02/2012  *RADIOLOGY REPORT*  Clinical Data: Acute myocardial infarction.  PORTABLE CHEST - 1 VIEW  Comparison: 09/02/2011  Findings: 2245 hours.  Lungs are clear. The cardiopericardial silhouette is within normal limits for size.  The patient is rotated slightly to the right which displaces the cardiomediastinal structures in the medial right hemithorax.  Degenerative changes are seen in the right glenohumeral joint. Telemetry leads overlie the chest.  IMPRESSION: Stable.  No acute cardiopulmonary findings.  Original Report Authenticated By: ERIC A. MANSELL, M.D.    Review of Systems  Constitutional: Negative for fever and chills.  Eyes: Negative for blurred vision and double vision.  Respiratory: Negative for cough and hemoptysis.   Cardiovascular: Positive for chest pain. Negative for palpitations, orthopnea, claudication and leg swelling.  Gastrointestinal: Negative for nausea and vomiting.  Neurological: Negative for dizziness and headaches.    Blood pressure 163/76, pulse 63, temperature 97.9 F (36.6 C), temperature source Oral, resp. rate 18, SpO2 99.00%. Physical Exam  Constitutional: He is oriented to person, place, and time.  He appears well-developed and well-nourished.  HENT:  Head: Atraumatic.  Eyes:  Conjunctivae are normal. Pupils are equal, round, and reactive to light. Left eye exhibits no discharge. No scleral icterus.  Neck: Normal range of motion. Neck supple. No JVD present. No tracheal deviation present. No thyromegaly present.  Cardiovascular: Normal rate and regular rhythm.  Exam reveals no friction rub.   Murmur (Soft systolic murmur and S4 gallop noted) heard. Respiratory: Effort normal and breath sounds normal. No respiratory distress. He has no wheezes. He has no rales.  GI: Soft. Bowel sounds are normal. He exhibits no distension. There is no tenderness. There is no rebound.  Musculoskeletal: He exhibits no edema and no tenderness.  Lymphadenopathy:    He has no cervical adenopathy.  Neurological: He is alert and oriented to person, place, and time.     Assessment/Plan Acute anterolateral wall myocardial infarction Remote tobacco abuse Positive family history of coronary artery disease Plan Discussed briefly with patient regarding emergency left cath and PTCA stenting its risk and benefits i.e. death MI stroke need for emergency CABG local last complications etc. and consented for PCI  [facility] N 02/03/2012, 12:25 AM

## 2012-02-03 NOTE — Cardiovascular Report (Signed)
Philip Parsons, Philip Parsons                  ACCOUNT NO.:  [id number]  MEDICAL RECORD NO.:  [id number]  LOCATION:  2914                         FACILITY:  [facility]  PHYSICIAN:  Eduardo Osier. Sharyn Lull, M.D. DATE OF BIRTH:  14-Apr-1941  DATE OF PROCEDURE:  02/02/2012 DATE OF DISCHARGE:                           CARDIAC CATHETERIZATION   PROCEDURE: 1. Left cardiac cath with selective left and right coronary     angiography, left ventriculography via right groin using Judkins     technique. 2. Successful percutaneous transluminal coronary angioplasty to     proximal left anterior descending using 2.5 x 12 mm long emergent     balloon. 3. Successful deployment of 3.5 x 32 mm long PROMUS Element drug-     eluting stent in proximal left anterior descending. 4. Successful postdilatation of this stent using 3.75 x 20 mm long [place]     Trek balloon.  INDICATION FOR THE PROCEDURE:  Philip Parsons is a 71 year old male with past medical history significant for remote tobacco abuse, positive family history of coronary artery disease.  The patient drove to [facility] ER complaining of recurrent retrosternal chest pain, which started approximately 4:00 pm this afternoon.  He went to see his PMD and had EKG done, which was normal.  He received GI cocktail with resolution of pain in 20-30 minutes.  He went home and had similar chest pain grade 10/10 radiating to the back.  Initial EKG done in the ER showed normal sinus rhythm with no acute ischemic changes.  While waiting in the ER, the patient developed again severe chest pain.  Repeat EKG showed normal sinus rhythm with more than 5 mm ST-elevation in anterolateral leads, code STEMI was called, and the patient was transferred to [facility] for further management.  The patient denies such episodes of chest pain in the past.  Denies history of exertional angina.  Denies nausea, vomiting, or diaphoresis.  Denies palpitation, lightheadedness, or syncope.  Denies  cardiac workup in the past.  Due to typical anginal chest pain, EKG changes, the patient was transferred to [facility] for emergency PCI.  DESCRIPTION OF PROCEDURE:  After obtaining the informed consent, the patient was brought to the cath lab and was placed on fluoroscopy table. Right groin was prepped and draped in usual fashion.  Xylocaine 1% was used for local anesthesia in the right groin.  With the help of thin- wall needle, a 6-French arterial sheath was placed.  The sheath was aspirated and flushed.  A 6-French left Judkins catheter was advanced over the wire under fluoroscopic guidance up to the ascending aorta.  Wire was pulled out. The catheter was aspirated and connected to the Manifold.  The catheter was further advanced and engaged into left coronary ostium.  Multiple views of the left system were taken.  The catheter was disengaged and was pulled out over the wire and was replaced with a 6-French right Judkins catheter, which was advanced over the wire under fluoroscopic guidance up to the ascending aorta.  Wire was pulled out.  The catheter was aspirated and connected to the Manifold.  The catheter was further advanced and engaged into right  coronary ostium.  Multiple views of the right system were taken.  The catheter was disengaged and was pulled out over the wire and was replaced with a 6-French pigtail catheter at the end of the procedure, which was advanced over the wire under fluoroscopic guidance up to the ascending aorta.  Wire was pulled out.  The catheter was aspirated and connected to the Manifold.  The catheter was further advanced across the aortic valve into the LV.  LV pressures were recorded.  LV graft was done in 30-degree RAO position.  Post-angiographic pressures were recorded from LV and then pullback pressures were recorded from the aorta.  There was no gradient across the aortic valve.  The pigtail catheter was pulled out over the  wire.  Sheaths were aspirated and flushed.  FINDINGS:  LV showed anterolateral wall hypokinesia, EF of approximately 50-55%.  Left main was patent.  LAD has 90-95% proximal stenosis with ruptured plaque with TIMI grade 3 distal flow and 20-25% mid stenosis. Diagonal 1 and 2 were very small.  Left circumflex had 15-20% mid stenosis.  High OM 1 had 10-15% proximal ostial and mid stenosis.  OM 2 was small, which was patent.  OM 3 was small, which was patent.  RCA had 15-20% mid stenosis.  PDA and PLV branches were small, which were patent.  INTERVENTIONAL PROCEDURE:  Successful PTCA to proximal LAD was done using 2.5 x 12 mm long Emerge balloon for predilatation and then 3.5 x 32 mm long PROMUS Element drug-eluting stent was deployed in proximal LAD at 11 atmospheric pressure.  Stent was post-dilated using 3.75 x 20 mm long [place] Trek balloon going up to 18 atmospheric pressure.  Lesion dilated from 90-95% to 0% residual with excellent TIMI grade 3 distal flow without evidence of dissection or distal embolization.  The patient did not have TIMI 2 flow in left circumflex after deploying the stent in LAD.  The patient received intracoronary nitro and verapamil with normalization of TIMI flow to grade 3.  The patient received weight- based Angiomax, 180 mg of Brilinta and was started on Integrilin drip during the procedure.  The patient tolerated the procedure well.  There were no complications.  The patient was transferred to CCU in stable condition.     Eduardo Osier. Sharyn Lull, M.D.     [facility]  D:  02/03/2012  T:  02/03/2012  Job:  [id number]

## 2012-02-03 NOTE — Progress Notes (Signed)
CARDIAC REHAB PHASE I   PRE:  Rate/Rhythm: 63SR  BP:  Supine:   Sitting: 117/61  Standing:    SaO2:   MODE:  Ambulation: 170 ft   POST:  Rate/Rhythem: 73SR  BP:  Supine:   Sitting: 127/63  Standing:    SaO2: 96%RA 1015-1102 Pt in bathroom upon entering room. Walked short distance with handheld asst. Denied  CP. To sitting on side of bed after walk. Education began with pt and family. Needs diet and ex ed. Permission given to refer to [facility] Phase 2. Assisted  to lying position before leaving.  Duanne Limerick

## 2012-02-03 NOTE — Progress Notes (Signed)
CRITICAL VALUE ALERT  Critical value received:  ckmb 24.2 trop I 5.12  Date of notification:  02/03/2012   Time of notification:  0845  Critical value read back:yes  Nurse who received alert:  Desiree Lucy   MD notified (1st page):  Harwani  Time of first page:  0845 (notified at bedside)  MD notified (2nd page):  Time of second page:  Responding MD:    Time MD responded:

## 2012-02-03 NOTE — Progress Notes (Signed)
Subjective:  Patient complains of soreness in the anterior chest localized No anginal chest pain no nausea or vomiting shortness of breath. States feels overall better Objective:  Vital Signs in the last 24 hours: Temp:  [97 F (36.1 C)-98.2 F (36.8 C)] 98.2 F (36.8 C) (05/09 0800) Pulse Rate:  [48-63] 59  (05/09 0800) Resp:  [11-19] 13  (05/09 0800) BP: (109-163)/(58-76) 117/58 mmHg (05/09 0800) SpO2:  [97 %-100 %] 98 % (05/09 0800) Weight:  [80.4 kg (177 lb 4 oz)] 80.4 kg (177 lb 4 oz) (05/09 0045)  Intake/Output from previous day: 05/08 0701 - 05/09 0700 In: 822 [I.V.:822] Out: 490 [Urine:490] Intake/Output from this shift: Total I/O In: -  Out: 200 [Urine:200]  Physical Exam: Neck: no adenopathy, no carotid bruit, no JVD and supple, symmetrical, trachea midline Lungs: clear to auscultation bilaterally Heart: regular rate and rhythm, S1, S2 normal and Soft systolic murmur noted no S3 gallop Abdomen: soft, non-tender; bowel sounds normal; no masses,  no organomegaly Extremities: extremities normal, atraumatic, no cyanosis or edema Right groin dressing dry no hematoma Lab Results:  Basename 02/03/12 0715 02/02/12 2246 02/02/12 2245  WBC 8.1 -- 11.3*  HGB 11.6* 14.3 --  PLT 258 -- 337    Basename 02/03/12 0715 02/02/12 2246  NA 141 141  K 3.2* 3.6  CL 106 105  CO2 26 --  GLUCOSE 87 103*  BUN 18 23  CREATININE 0.99 1.30    Basename 02/03/12 0715 02/03/12 0047  TROPONINI 5.12* 0.65*   Hepatic Function Panel  Basename 02/03/12 0715  PROT 5.6*  ALBUMIN 2.6*  AST 21  ALT 10  ALKPHOS 44  BILITOT 0.5  BILIDIR --  IBILI --    Basename 02/03/12 0715  CHOL 165   No results found for this basename: PROTIME in the last 72 hours  Imaging: Imaging results have been reviewed and Dg Chest Port 1 View  02/02/2012  *RADIOLOGY REPORT*  Clinical Data: Acute myocardial infarction.  PORTABLE CHEST - 1 VIEW  Comparison: 09/02/2011  Findings: 2245 hours.  Lungs are  clear. The cardiopericardial silhouette is within normal limits for size.  The patient is rotated slightly to the right which displaces the cardiomediastinal structures in the medial right hemithorax.  Degenerative changes are seen in the right glenohumeral joint. Telemetry leads overlie the chest.  IMPRESSION: Stable.  No acute cardiopulmonary findings.  Original Report Authenticated By: ERIC A. MANSELL, M.D.    Cardiac Studies:  Assessment/Plan:  Status post acute anterolateral wall myocardial infarction Status post PTCA stenting to proximal LAD with excellent results Hypercholesteremia Remote tobacco abuse Positive family history of coronary artery disease Hypokalemia Plan Replace K. DC nitroglycerin DC IV fluids changed to Hep-Lock DC IV Integrilin once present bottle is complete Phase I cardiac rehabilitation Check labs in a.m.  LOS: 1 day    Philip Parsons N 02/03/2012, 9:45 AM

## 2012-02-04 DIAGNOSIS — I2109 ST elevation (STEMI) myocardial infarction involving other coronary artery of anterior wall: Secondary | ICD-10-CM | POA: Diagnosis not present

## 2012-02-04 DIAGNOSIS — E7800 Pure hypercholesterolemia, unspecified: Secondary | ICD-10-CM | POA: Diagnosis not present

## 2012-02-04 DIAGNOSIS — I219 Acute myocardial infarction, unspecified: Secondary | ICD-10-CM | POA: Diagnosis not present

## 2012-02-04 LAB — CBC
Hemoglobin: 11.9 g/dL — ABNORMAL LOW (ref 13.0–17.0)
MCH: 29.7 pg (ref 26.0–34.0)
MCV: 88.3 fL (ref 78.0–100.0)
RBC: 4.01 MIL/uL — ABNORMAL LOW (ref 4.22–5.81)

## 2012-02-04 LAB — BASIC METABOLIC PANEL
CO2: 28 mEq/L (ref 19–32)
GFR calc non Af Amer: 59 mL/min — ABNORMAL LOW (ref >90)
Glucose, Bld: 89 mg/dL (ref 70–99)
Potassium: 3.9 mEq/L (ref 3.5–5.1)
Sodium: 141 mEq/L (ref 135–145)

## 2012-02-04 LAB — POCT ACTIVATED CLOTTING TIME: Activated Clotting Time: 386 seconds

## 2012-02-04 LAB — CARDIAC PANEL(CRET KIN+CKTOT+MB+TROPI)
Relative Index: 5 — ABNORMAL HIGH (ref 0.0–2.5)
Total CK: 291 U/L — ABNORMAL HIGH (ref 7–232)

## 2012-02-04 MED ORDER — RAMIPRIL 2.5 MG PO CAPS: 2.5000 mg | ORAL_CAPSULE | Freq: Every day | ORAL | Status: DC

## 2012-02-04 MED ORDER — TICAGRELOR 90 MG PO TABS: 90.0000 mg | ORAL_TABLET | Freq: Two times a day (BID) | ORAL | Status: DC

## 2012-02-04 MED ORDER — ASPIRIN 81 MG PO CHEW: 81.0000 mg | CHEWABLE_TABLET | Freq: Every day | ORAL | Status: AC

## 2012-02-04 MED ORDER — METOPROLOL SUCCINATE ER 25 MG PO TB24: 12.5000 mg | ORAL_TABLET | Freq: Every day | ORAL | Status: DC

## 2012-02-04 MED ORDER — ATORVASTATIN CALCIUM 80 MG PO TABS: 80.0000 mg | ORAL_TABLET | Freq: Every day | ORAL | Status: DC

## 2012-02-04 NOTE — Progress Notes (Signed)
CARDIAC REHAB PHASE I   PRE:  Rate/Rhythm: 59SB  BP:  Supine: 154/86  Sitting:   Standing:    SaO2:   MODE:  Ambulation: 350 ft   POST:  Rate/Rhythem: 71SR  BP:  Supine: 149/66  Sitting:   Standing:    SaO2:  1325-1355 Pt tolerated walk well. Gait steady. Education completed. Referring to [facility] Phase 2.  Duanne Limerick

## 2012-02-04 NOTE — Discharge Summary (Signed)
  Discharge summary dictated on 02/04/2012 dictation number is [phone]

## 2012-02-04 NOTE — Discharge Instructions (Signed)
Acute Coronary Syndrome °Acute coronary syndrome (ACS) is an urgent problem in which the blood and oxygen supply to the heart is critically deficient. ACS requires hospitalization because one or more coronary arteries may be blocked. °ACS represents a range of conditions including: °· Previous angina that is now unstable, lasts longer, happens at rest, or is more intense.  °· A heart attack, with heart muscle cell injury and death.  °There are three vital coronary arteries that supply the heart muscle with blood and oxygen so that it can pump blood effectively. If blockages to these arteries develop, blood flow to the heart muscle is reduced. If the heart does not get enough blood, angina may occur as the first warning sign. °SYMPTOMS  °· The most common signs of angina include:  °· Tightness or squeezing in the chest.  °· Feeling of heaviness on the chest.  °· Discomfort in the arms, neck, or jaw.  °· Shortness of breath and nausea.  °· Cold, wet skin.  °· Angina is usually brought on by physical effort or excitement which increase the oxygen needs of the heart. These states increase the blood flow needs of the heart beyond what can be delivered.  °TREATMENT  °· Medicines to help discomfort may include nitroglycerin (nitro) in the form of tablets or a spray for rapid relief, or longer-acting forms such as cream, patches, or capsules. (Be aware that there are many side effects and possible interactions with other drugs).  °· Other medicines may be used to help the heart pump better.  °· Procedures to open blocked arteries including angioplasty or stent placement to keep the arteries open.  °· Open heart surgery may be needed when there are many blockages or they are in critical locations that are best treated with surgery.  °HOME CARE INSTRUCTIONS  °· Avoid smoking.  °· Take one baby or adult aspirin daily, if your caregiver advises. This helps reduce the risk of a heart attack.  °· It is very important that you  follow the angina treatment prescribed by your caregiver. Make arrangements for proper follow-up care.  °· Eat a heart healthy diet with salt and fat restrictions as advised.  °· Regular exercise is good for you as long as it does not cause discomfort. Do not begin any new type of exercise until you check with your caregiver.  °· If you are overweight, you should lose weight.  °· Try to maintain normal blood lipid levels.  °· Keep your blood pressure under control as recommended by your caregiver.  °· You should tell your caregiver right away about any increase in the severity or frequency of your chest discomfort or angina attacks. When you have angina, you should stop what you are doing and sit down. This may bring relief in 3 to 5 minutes. If your caregiver has prescribed nitro, take it as directed.  °· If your caregiver has given you a follow-up appointment, it is very important to keep that appointment. Not keeping the appointment could result in a chronic or permanent injury, pain, and disability. If there is any problem keeping the appointment, you must call back to this facility for assistance.  °SEEK IMMEDIATE MEDICAL CARE IF:  °· You develop nausea, vomiting, or shortness of breath.  °· You feel faint, lightheaded, or pass out.  °· Your chest discomfort gets worse.  °· You are sweating or experience sudden profound fatigue.  °· You do not get relief of your chest pain after 3 doses   of nitro.   Your discomfort lasts longer than 15 minutes.  MAKE SURE YOU:   Understand these instructions.   Will watch your condition.   Will get help right away if you are not doing well or get worse.  Document Released: 09/13/2005 Document Revised: 09/02/2011 Document Reviewed: 04/16/2008 [facility] Patient Information 2012 [place], [place].Myocardial Infarction A myocardial infarction (MI) is damage to the heart that is not reversible. It is also called a heart attack. An MI usually occurs when a heart (coronary)  artery becomes blocked or narrowed. This cuts off the blood supply to the heart. When one or more of the heart (coronary) arteries becomes blocked, that area of the heart begins to die. This causes pain felt during an MI.  If you think you might be having an MI, call your local emergency services immediately (911 in [place] It is recommended that you take a 162 mg non-enteric coated aspirin if you do not have an aspirin allergy. Do not drive yourself to the hospital or wait to see if your symptoms go away. The sooner MI is treated, the greater the amount of heart muscle saved. Time is muscle. It can save your life. CAUSES  An MI can occur from:  A gradual buildup of a fatty substance called plaque. When plaque builds up in the arteries, this condition is called atherosclerosis. This buildup can block or reduce the blood supply to the heart artery(s).   A sudden plaque rupture within a heart artery that causes a blood clot (thrombus). A blood clot can block the heart artery which does not allow blood flow to the heart.   A severe tightening (spasm) of the heart artery. This is a less common cause of a heart attack. When a heart artery spasms, it cuts off blood flow through the artery. Spasms can occur in heart arteries that do not have atherosclerosis.  RISK FACTORS People at risk for an MI usually have one or more risk factors, such as:  High blood pressure.   High cholesterol.   Smoking.   Gender. Men have a higher heart attack risk.   Overweight/obesity.   Age.   Family history.   Lack of exercise.   Diabetes.   Stress.   Excessive alcohol use.   Street drug use (cocaine and methamphetamines).  SYMPTOMS  MI symptoms can vary, such as:  In both men and women, MI symptoms can include the following:   Chest pain. The chest pain may feel like a crushing, squeezing, or "pressure" type feeling. MI pain can be "referred," meaning pain can be caused in one part of the body but felt  in another part of the body. Referred MI pain may occur in the left arm, neck, or jaw. Pain may even be felt in the right arm.   Shortness of breath (dyspnea).   Heartburn or indigestion with or without vomiting, shortness of breath, or sweating (diaphoresis).   Sudden, cold sweats.   Sudden lightheadedness.   Upper back pain.   Women can have unique MI symptoms, such as:   Unexplained feelings of nervousness or anxiety.   Discomfort between the shoulder blades (scapula) or upper back.   Tingling in the hands and arms.   In elderly people (regardless of gender), MI symptoms can be subtle, such as:   Sweating (diaphoresis).   Shortness of breath (dyspnea).   General tiredness (fatigue) or not feeling well (malaise).  DIAGNOSIS  Diagnosis of an MI involves several tests such as:  An assessment of your vital signs such as heart rhythm, blood pressure, respiratory rate, and oxygen level.   An EKG (ECG) to look at the electrical activity of your heart.   Blood tests called cardiac markers are drawn at scheduled times to measure proteins or enzymes released by the damaged heart muscle.   A chest X-ray.   An echocardiogram to evaluate heart motion and blood flow.   Coronary angiography (cardiac catheterization). This is a diagnostic procedure to look at the heart arteries.  TREATMENT  Acute Intervention. For an MI, the national standard in the [place] is to have an acute intervention in under 90 minutes from the time you get to the hospital. An acute intervention is a special procedure to open up the heart arteries. It is done in a treatment room called a "catheterization lab" (cath lab). Some hospitals do no have a cath lab. If you are having an MI and the hospital does not have a cath lab, the standard is to transport you to a hospital that has one. In the cath lab, acute intervention includes:  Angioplasty. An angioplasty involves inserting a thin, flexible tube  (catheter) into an artery in either your groin or wrist. The catheter is threaded to the heart arteries. A balloon at the end of the catheter is inflated to open a narrowed or blocked heart artery. During an angioplasty procedure, a small mesh tube (stent) may be used to keep the heart artery open. Depending on your condition and health history, one of two types of stents may be placed:   Drug-eluting stent (DES). A DES is coated with a medicine to prevent scar tissue from growing over the stent. With drug-eluting stents, blood thinning medicine will need to be taken for up to a year.   Bare metal stent. This type of stent has no special coating to keep tissue from growing over it. This type of stent is used if you cannot take blood thinning medicine for a prolonged time or you need surgery in the near future. After a bare metal stent is placed, blood thinning medicine will need to be taken for about a month.   If you are taking blood thinning medicine (anti-platelet therapy) after stent placement, do not stop taking it unless your caregiver says it is okay to do so. Make sure you understand how long you need to take the medicine.  Surgical Intervention  If an acute intervention is not successful, surgery may be needed:   Open heart surgery (coronary artery bypass graft, CABG). CABG takes a vein (saphenous vein) from your leg. The vein is then attached to the blocked heart artery which bypasses the blockage. This then allows blood flow to the heart muscle.  Additional Interventions  A "clot buster" medicine (thrombolytic) may be given. This medicine can help break up a clot in the heart artery. This medicine may be given if a person cannot get to a cath lab right away.   Intra-aortic balloon pump (IABP). If you have suffered a very severe MI and are too unstable to go to the cath lab or to surgery, an IABP may be used. This is a temporary mechanical device used to increase blood flow to the heart and  reduce the workload of the heart until you are stable enough to go to the cath lab or surgery.  HOME CARE INSTRUCTIONS After an MI, you may need the following:  Medication. Take medication as directed by your caregiver. Medications after an MI  may:   Keep your blood from clotting easily (blood thinners).   Control your blood pressure.   Help lower your cholesterol.   Control abnormal heart rhythms.   Lifestyle changes. Under the guidance of your caregiver, lifestyle changes include:   Quitting smoking, if you smoke. Your caregiver can help you quit.   Being physically active.   Maintaining a healthy weight.   Eating a heart healthy diet. A dietician can help you learn healthy eating options.   Managing diabetes.   Reducing stress.   Limiting alcohol intake.  SEEK IMMEDIATE MEDICAL CARE IF:   You have severe chest pain, especially if the pain is crushing or pressure-like and spreads to the arms, back, neck, or jaw. This is an emergency. Do not wait to see if the pain will go away. Get medical help at once. Call your local emergency services (911 in the [place] Do not drive yourself to the hospital.   You have shortness of breath during rest, sleep, or with activity.   You have sudden sweating or clammy skin.   You feel sick to your stomach (nauseous) and throw up (vomit).   You suddenly become lightheaded or dizzy.   You feel your heart beating rapidly or you notice "skipped" beats.  MAKE SURE YOU:   Understand these instructions.   Will watch your condition.   Will get help right away if you are not doing well or get worse.  Document Released: 09/13/2005 Document Revised: 09/02/2011 Document Reviewed: 02/10/2011 [facility] Patient Information 2012 [place], [place].Angioplasty Angioplasty is a procedure to widen a narrow blood vessel. The procedure is usually done on the blood vessels of the heart (coronary arteries) but may help vessels to other parts of the body such  as the legs. When a vessel in the heart becomes partially blocked there is decreased blood flow to that area. This may lead to chest pain or a heart attack (myocardial infarction).  Angioplasty may be done after a procedure that found a problem or as an emergency to treat a heart attack by opening the blocked arteries. The arteries are usually blocked by cholesterol buildup (plaque) in the lining or walls. LET YOUR CAREGIVER KNOW ABOUT:  Allergies.   Medicines taken, including herbs, eyedrops, over-the-counter medicines, and creams.   Use of steroids (by mouth or creams).   Previous problems with anesthetics or numbing medicines.   Possibility of pregnancy, if this applies.   History of blood clots (thrombophlebitis).   History of bleeding or blood problems.   Previous surgery.   Other health problems.  RISKS AND COMPLICATIONS  Damage to the artery.   A blockage may return.   Bleeding at the insertion site.   Blood clot to another part of the body.  BEFORE THE PROCEDURE  Let your caregiver know if you have had an allergy to dyes used in X-ray, or if you have ever had kidney problems or failure.   Do not eat or drink starting from midnight up to the time of the procedure, or as directed.   You may drink enough water to take your medicines the morning of the procedure if you were instructed to do so.   You should be at the hospital or outpatient facility where the procedure is to be done 60 minutes prior to the procedure or as directed.  PROCEDURE  You may be given a medicine to help you relax before and during the procedure through an intravenous (IV) access in your hand or arm.  Medicine that numbs the area (local anesthetic) may be used before inserting the long, thin tube (catheter).   You will be prepared for the procedure by washing and shaving the area where the catheter will be inserted. This is usually done in the groin.   A catheter will be inserted into an  artery using a guide wire. This is guided under a type of X-ray (fluoroscopy) to the opening of the blocked artery.   Dye is then injected and X-rays are taken.   Once positioned at the narrowed portion of the blood vessel, the balloon is inflated to make the artery wider. Expanding the balloon crushes the plaque into the wall of the vessel and improves the blood flow.   Sometimes the artery may be made wider using a laser or other tools to remove plaque.   When the blood flow is better, the balloon is deflated and the catheter is removed.  AFTER THE PROCEDURE  You will stay in bed for several hours.   The access site will be watched and you will be checked frequently.   Blood tests, X-rays, and an electrocardiogram (EKG) may be done.   You may stay in the hospital overnight for observation.  Document Released: 09/10/2000 Document Revised: 09/02/2011 Document Reviewed: 01/04/2008 [facility] Patient Information 2012 [place], [place].Angioplasty Angioplasty is a procedure to widen a narrow blood vessel. The procedure is usually done on the blood vessels of the heart (coronary arteries) but may help vessels to other parts of the body such as the legs. When a vessel in the heart becomes partially blocked there is decreased blood flow to that area. This may lead to chest pain or a heart attack (myocardial infarction).  Angioplasty may be done after a procedure that found a problem or as an emergency to treat a heart attack by opening the blocked arteries. The arteries are usually blocked by cholesterol buildup (plaque) in the lining or walls. LET YOUR CAREGIVER KNOW ABOUT:  Allergies.   Medicines taken, including herbs, eyedrops, over-the-counter medicines, and creams.   Use of steroids (by mouth or creams).   Previous problems with anesthetics or numbing medicines.   Possibility of pregnancy, if this applies.   History of blood clots (thrombophlebitis).   History of bleeding or blood  problems.   Previous surgery.   Other health problems.  RISKS AND COMPLICATIONS  Damage to the artery.   A blockage may return.   Bleeding at the insertion site.   Blood clot to another part of the body.  BEFORE THE PROCEDURE  Let your caregiver know if you have had an allergy to dyes used in X-ray, or if you have ever had kidney problems or failure.   Do not eat or drink starting from midnight up to the time of the procedure, or as directed.   You may drink enough water to take your medicines the morning of the procedure if you were instructed to do so.   You should be at the hospital or outpatient facility where the procedure is to be done 60 minutes prior to the procedure or as directed.  PROCEDURE  You may be given a medicine to help you relax before and during the procedure through an intravenous (IV) access in your hand or arm.   Medicine that numbs the area (local anesthetic) may be used before inserting the long, thin tube (catheter).   You will be prepared for the procedure by washing and shaving the area where the catheter will be  inserted. This is usually done in the groin.   A catheter will be inserted into an artery using a guide wire. This is guided under a type of X-ray (fluoroscopy) to the opening of the blocked artery.   Dye is then injected and X-rays are taken.   Once positioned at the narrowed portion of the blood vessel, the balloon is inflated to make the artery wider. Expanding the balloon crushes the plaque into the wall of the vessel and improves the blood flow.   Sometimes the artery may be made wider using a laser or other tools to remove plaque.   When the blood flow is better, the balloon is deflated and the catheter is removed.  AFTER THE PROCEDURE  You will stay in bed for several hours.   The access site will be watched and you will be checked frequently.   Blood tests, X-rays, and an electrocardiogram (EKG) may be done.   You may stay  in the hospital overnight for observation.  Document Released: 09/10/2000 Document Revised: 09/02/2011 Document Reviewed: 01/04/2008 [facility] Patient Information 2012 [place], [place].

## 2012-02-04 NOTE — Discharge Summary (Signed)
NAMETRACY, KINNER                  ACCOUNT NO.:  [id number]  MEDICAL RECORD NO.:  [id number]  LOCATION:  2914                         FACILITY:  [facility]  PHYSICIAN:  Eduardo Osier. Sharyn Lull, M.D. DATE OF BIRTH:  [birth year]  DATE OF ADMISSION:  02/02/2012 DATE OF DISCHARGE:  02/04/2012                              DISCHARGE SUMMARY   ADMITTING DIAGNOSES: 1. Acute anterolateral wall myocardial infarction. 2. Remote tobacco abuse. 3. Positive family history of coronary artery disease.  DISCHARGE DIAGNOSES: 1. Status post acute anterolateral wall myocardial infarction, status     post emergency percutaneous transluminal coronary angioplasty     stenting to proximal left anterior descending. 2. Hypercholesteremia. 3. Remote tobacco abuse. 4. Positive family history of coronary artery disease.  DISCHARGE HOME MEDICATIONS: 1. Enteric-coated aspirin 81 mg 1 tab daily. 2. Brilinta 90 mg 1 tablet twice daily. 3. Atorvastatin 80 mg 1 tablet daily. 4. Toprol-XL 25 mg half tablet daily. 5. Ramipril 2.5 mg 1 capsule daily. 6. Vitamin D 1000 units daily as before. 7. Multivitamin 1 tablet daily as before. 8. Nitrostat 0.4 mg sublingual use as directed.  The patient has been advised to stop calcium, Naprosyn, and vitamin C.  DIET:  Low salt, low cholesterol.  ACTIVITY:  Increase activity slowly as tolerated.  INSTRUCTIONS:  The patient has been scheduled for phase 2 cardiac rehab. Condition on discharge is stable.  Post PTCA stent instructions have been given.  Follow up with me in 1 week.  BRIEF HISTORY:  Mr. Goeken is a 71 year old male with past medical history significant for remote tobacco abuse, positive family history of coronary artery disease.  He drove to [facility] ER complaining of recurrent retrosternal chest pain that started approximately 4:00 pm. He went to PMD's office and had EKG done, which showed normal sinus rhythm with no acute ischemic changes; received GI cocktail,  pain resolved in 20-30 minutes.  He went home and had similar pain, grade 7/10, radiating to the back, drove to [employer].  EKG done in the ER initially showed normal sinus rhythm with no significant ischemic changes.  While waiting in the ER, the patient again developed severe chest pain, grade 10/10.  Repeat EKG done in the ER showed normal sinus rhythm with more than 5 mm ST-elevation in anterolateral leads.  Code STEMI was called and the patient was transferred to [facility] for further management.  The patient denies such episodes of chest pain in the past.  Denies history of exertional chest pain.  Denies nausea, vomiting, diaphoresis.  Denies palpitation, lightheadedness, or syncope. Denies any cardiac workup in the past.  He states he takes occasionally aspirin and ibuprofen off and on.  PAST MEDICAL HISTORY:  As above.  PAST SURGICAL HISTORY:  None.  PHYSICAL EXAMINATION:  GENERAL:  He was alert, awake, oriented x3. VITAL SIGNS:  Blood pressure was 163/76, pulse was 63. HEENT:  Conjunctiva was pink. NECK:  Supple.  No JVD.  No bruit. LUNGS:  Clear to auscultation without rhonchi or rales. CARDIOVASCULAR:  S1, S2 was normal.  There was soft systolic murmur and S4 gallop. ABDOMEN:  Soft.  Bowel sounds were present. EXTREMITIES:  No clubbing, cyanosis, or edema.  LABORATORY DATA:  Initial labs, sodium 141, potassium 3.6, BUN 23, creatinine 1.30.  His glucose was 103, fasting-repeat glucose was 87. His cardiac enzymes, first set CK was 127, MB 3.2.  Second set CK was 98, MB 3.2.  Third set CK was 262, MB 24.2.  Next set CK was 381, MB 34.1.  Today's CK is 291, MB 14.5, which is trending down.  His troponin I was 0.65.  Repeat troponin I 5.12 and repeat 5.32, today is 3.68.  His cholesterol was 165, LDL was 100, HDL 44, triglycerides were 106.  His hemoglobin was 14.1, hematocrit 40.9, white count of 11.3; repeat hemoglobin yesterday was 11.6, hematocrit 34.2,  white count of 8.1. Today, hemoglobin is 11.9, hematocrit 35.4, white count of 9.4, platelet count is 269,000.  Repeat EKG post-procedure showed sinus bradycardia with no acute ischemic changes.  BRIEF HOSPITAL COURSE:  The patient was directly transferred from [facility] and underwent emergency PTCA with stenting to proximal LAD as per procedure report, with excellent results.  The patient tolerated the procedure well.  Post procedure, the patient did not have any episodes of anginal chest pain.  Phase 1 cardiac rehab was called.  The patient has been ambulating in hallway without any problems.  His groin is stable with no evidence of hematoma or bruit.  The patient will be discharged home on above medications and will be followed up in my office in 1 week.  The patient is scheduled for phase 2 cardiac rehab as outpatient.     Eduardo Osier. Sharyn Lull, M.D.     [facility]/MEDQ  D:  02/04/2012  T:  02/04/2012  Job:  [id number]

## 2012-02-04 NOTE — Progress Notes (Signed)
Pt being discharged home , family and pt given instructions with reverbalization by pt.

## 2012-02-11 DIAGNOSIS — I10 Essential (primary) hypertension: Secondary | ICD-10-CM | POA: Diagnosis not present

## 2012-02-11 DIAGNOSIS — E7800 Pure hypercholesterolemia, unspecified: Secondary | ICD-10-CM | POA: Diagnosis not present

## 2012-02-11 DIAGNOSIS — I2510 Atherosclerotic heart disease of native coronary artery without angina pectoris: Secondary | ICD-10-CM | POA: Diagnosis not present

## 2012-02-11 DIAGNOSIS — R0789 Other chest pain: Secondary | ICD-10-CM | POA: Diagnosis not present

## 2012-02-16 DIAGNOSIS — I2510 Atherosclerotic heart disease of native coronary artery without angina pectoris: Secondary | ICD-10-CM | POA: Diagnosis not present

## 2012-02-16 DIAGNOSIS — R0789 Other chest pain: Secondary | ICD-10-CM | POA: Diagnosis not present

## 2012-02-16 DIAGNOSIS — E7800 Pure hypercholesterolemia, unspecified: Secondary | ICD-10-CM | POA: Diagnosis not present

## 2012-02-16 DIAGNOSIS — I10 Essential (primary) hypertension: Secondary | ICD-10-CM | POA: Diagnosis not present

## 2012-02-17 ENCOUNTER — Encounter (HOSPITAL_COMMUNITY): Payer: Self-pay

## 2012-02-17 ENCOUNTER — Encounter (HOSPITAL_COMMUNITY)
Admission: RE | Admit: 2012-02-17 | Discharge: 2012-02-17 | Disposition: A | Discharge status: Home / Self Care | Payer: Medicare Other | Source: Ambulatory Visit | Attending: Cardiology | Admitting: Cardiology

## 2012-02-17 DIAGNOSIS — Z9861 Coronary angioplasty status: Secondary | ICD-10-CM | POA: Insufficient documentation

## 2012-02-17 DIAGNOSIS — E7800 Pure hypercholesterolemia, unspecified: Secondary | ICD-10-CM | POA: Insufficient documentation

## 2012-02-17 DIAGNOSIS — I2109 ST elevation (STEMI) myocardial infarction involving other coronary artery of anterior wall: Secondary | ICD-10-CM | POA: Insufficient documentation

## 2012-02-17 DIAGNOSIS — Z87891 Personal history of nicotine dependence: Secondary | ICD-10-CM | POA: Insufficient documentation

## 2012-02-17 DIAGNOSIS — I2510 Atherosclerotic heart disease of native coronary artery without angina pectoris: Secondary | ICD-10-CM | POA: Insufficient documentation

## 2012-02-17 DIAGNOSIS — Z5189 Encounter for other specified aftercare: Secondary | ICD-10-CM | POA: Insufficient documentation

## 2012-02-17 NOTE — Progress Notes (Signed)
Cardiac Rehab Medication Review by a Pharmacist  Does the patient  feel that his/her medications are working for him/her?  yes  Has the patient been experiencing any side effects to the medications prescribed?  yes  Does the patient measure his/her own blood pressure or blood glucose at home?  yes   Does the patient have any problems obtaining medications due to transportation or finances?   no  Understanding of regimen: good Understanding of indications: good Potential of compliance: excellent    Pharmacist comments: Patient is reporting some muscle pains in left leg and also some pressure in his head. Also patient does test blood pressure at home and is concerned about some lower readings: ~111/66.     Juliann Pulse 02/17/2012 8:38 AM

## 2012-02-23 ENCOUNTER — Encounter (HOSPITAL_COMMUNITY)
Admission: RE | Admit: 2012-02-23 | Discharge: 2012-02-23 | Disposition: A | Discharge status: Home / Self Care | Payer: Medicare Other | Source: Ambulatory Visit | Attending: Cardiology | Admitting: Cardiology

## 2012-02-23 DIAGNOSIS — Z9861 Coronary angioplasty status: Secondary | ICD-10-CM | POA: Diagnosis not present

## 2012-02-23 DIAGNOSIS — I2109 ST elevation (STEMI) myocardial infarction involving other coronary artery of anterior wall: Secondary | ICD-10-CM | POA: Diagnosis not present

## 2012-02-23 DIAGNOSIS — E7800 Pure hypercholesterolemia, unspecified: Secondary | ICD-10-CM | POA: Diagnosis not present

## 2012-02-23 DIAGNOSIS — I2510 Atherosclerotic heart disease of native coronary artery without angina pectoris: Secondary | ICD-10-CM | POA: Diagnosis not present

## 2012-02-23 DIAGNOSIS — Z87891 Personal history of nicotine dependence: Secondary | ICD-10-CM | POA: Diagnosis not present

## 2012-02-23 DIAGNOSIS — Z5189 Encounter for other specified aftercare: Secondary | ICD-10-CM | POA: Diagnosis not present

## 2012-02-23 NOTE — Progress Notes (Signed)
Philip Parsons 71 y.o. male       Nutrition Screen                                                                    YES  NO Do you live in a nursing home?  X   Do you eat out more than 3 times/week?    X If yes, how many times per week do you eat out?    Do you have food allergies?   X If yes, what are you allergic to?  Have you gained or lost more than 10 lbs without trying?               X If yes, how much weight have you lost and over what time period?    Do you want to lose weight?     X If yes, what is a goal weight or amount of weight you would like to lose?    Do you eat alone most of the time?   X   Do you eat less than 2 meals/day?  X If yes, how many meals do you eat?    Do you drink more than 3 alcohol drinks/day?  X If yes, how many drinks per day?   Are you having trouble with constipation? *  X If yes, what are you doing to help relieve constipation?  Do you have financial difficulties with buying food?*    X   Are you experiencing regular nausea/ vomiting?*     X   Do you have a poor appetite? *                                        X   Do you have trouble chewing/swallowing? *   X    Pt with diagnoses of:  X Stent/ PTCA X %  Body fat >goal / Body Mass Index >25 X MI       Pt Risk Score   0       Diagnosis Risk Score  15       Total Risk Score   15                         High Risk               X Low Risk    HT: 69" Ht Readings from Last 1 Encounters:  02/17/12 5\' 9"  (1.753 m)    WT:   168.7 lb (76.7 kg) Wt Readings from Last 3 Encounters:  02/17/12 169 lb 1.5 oz (76.7 kg)  02/03/12 177 lb 4 oz (80.4 kg)  02/03/12 177 lb 4 oz (80.4 kg)     IBW 72.7 105%IBW BMI 25.0 25.6%body fat  Meds reviewed: MVI, Vitamin D Past Medical History  Diagnosis Date  . No pertinent past medical history    Activity level: Pt is moderately active  Wt goal: 168 lb ( 76.7 kg) Current tobacco use? No Food/Drug Interaction? No Labs:  Lipid Panel     Component Value  Date/Time   CHOL 165 02/03/2012 0715   TRIG  106 02/03/2012 0715   HDL 44 02/03/2012 0715   CHOLHDL 3.8 02/03/2012 0715   VLDL 21 02/03/2012 0715   LDLCALC 100* 02/03/2012 0715   No results found for this basename: HGBA1C  02/03/12 Glucose 100  LDL goal: < 70      MI and > 2:      family h/o, > 73 yo male Estimated Daily Nutrition Needs for: ? wt maintenance 2100-2400 Kcal , Total Fat 70-80 gm, Saturated Fat 16-18 gm, Trans Fat 2.3-2.7 gm,  Sodium less than 1500 mg

## 2012-02-23 NOTE — Progress Notes (Signed)
Pt started cardiac rehab today.  Pt tolerated light exercise without difficulty.  Monitor showed sr with not ectopy noted.  Pt with one medication change but could not remember the name.  Pt to bring the change in on the next exercise day.  Continue to monitor

## 2012-02-25 ENCOUNTER — Encounter (HOSPITAL_COMMUNITY)
Admission: RE | Admit: 2012-02-25 | Discharge: 2012-02-25 | Disposition: A | Discharge status: Home / Self Care | Payer: Medicare Other | Source: Ambulatory Visit | Attending: Cardiology | Admitting: Cardiology

## 2012-02-25 NOTE — Progress Notes (Signed)
Pt reported that his Altace was d/'c'd due to low bp.

## 2012-02-28 ENCOUNTER — Encounter (HOSPITAL_COMMUNITY)
Admission: RE | Admit: 2012-02-28 | Discharge: 2012-02-28 | Disposition: A | Discharge status: Home / Self Care | Payer: Medicare Other | Source: Ambulatory Visit | Attending: Cardiology | Admitting: Cardiology

## 2012-02-28 DIAGNOSIS — Z9861 Coronary angioplasty status: Secondary | ICD-10-CM | POA: Diagnosis not present

## 2012-02-28 DIAGNOSIS — I2510 Atherosclerotic heart disease of native coronary artery without angina pectoris: Secondary | ICD-10-CM | POA: Diagnosis not present

## 2012-02-28 DIAGNOSIS — I2109 ST elevation (STEMI) myocardial infarction involving other coronary artery of anterior wall: Secondary | ICD-10-CM | POA: Diagnosis not present

## 2012-02-28 DIAGNOSIS — E7800 Pure hypercholesterolemia, unspecified: Secondary | ICD-10-CM | POA: Insufficient documentation

## 2012-02-28 DIAGNOSIS — Z5189 Encounter for other specified aftercare: Secondary | ICD-10-CM | POA: Diagnosis not present

## 2012-02-28 DIAGNOSIS — Z87891 Personal history of nicotine dependence: Secondary | ICD-10-CM | POA: Diagnosis not present

## 2012-03-01 ENCOUNTER — Encounter (HOSPITAL_COMMUNITY): Payer: Medicare Other

## 2012-03-01 ENCOUNTER — Telehealth (HOSPITAL_COMMUNITY): Payer: Self-pay | Admitting: *Deleted

## 2012-03-01 NOTE — Telephone Encounter (Signed)
Pt called and left message that he would not be in attendance today due to "gout flare up in his toe."

## 2012-03-03 ENCOUNTER — Encounter (HOSPITAL_COMMUNITY)
Admission: RE | Admit: 2012-03-03 | Discharge: 2012-03-03 | Disposition: A | Discharge status: Home / Self Care | Payer: Medicare Other | Source: Ambulatory Visit | Attending: Cardiology | Admitting: Cardiology

## 2012-03-06 ENCOUNTER — Encounter (HOSPITAL_COMMUNITY)
Admission: RE | Admit: 2012-03-06 | Discharge: 2012-03-06 | Disposition: A | Discharge status: Home / Self Care | Payer: Medicare Other | Source: Ambulatory Visit | Attending: Cardiology | Admitting: Cardiology

## 2012-03-06 NOTE — Progress Notes (Signed)
Reviewed home exercise with pt today.  Pt plans to walk at home for exercise.  Reviewed THR, pulse, RPE, sign and symptoms, NTG use, and when to call 911 or MD.  Pt voiced understanding. Constantina Laseter, MA, ACSM RCEP   

## 2012-03-06 NOTE — Progress Notes (Signed)
Pt with multiple questions regarding his Lipitor side effects such as soft stools and "tight band around my head" after he takes the medications.  Pt denies any complaints any other time of the day.  The symptoms begin right after he takes his Lipitor in the evening.  Pt offered to contact Dr. Sharyn Lull office.  Pt stated that he would like to call himself and talk to Dr. Sharyn Lull.

## 2012-03-08 ENCOUNTER — Encounter (HOSPITAL_COMMUNITY)
Admission: RE | Admit: 2012-03-08 | Discharge: 2012-03-08 | Disposition: A | Discharge status: Home / Self Care | Payer: Medicare Other | Source: Ambulatory Visit | Attending: Cardiology | Admitting: Cardiology

## 2012-03-09 NOTE — Progress Notes (Signed)
Pt held his cholesterol lowering medication because of the untoward side effects he experienced each evening after taking it. For the last two days of holding his medication he did not have any symptoms.  Pt plans to contact Dr. Sharyn Lull office to let him know of his complaints.  Pt declined offer to contact Dr. Sharyn Lull.

## 2012-03-10 ENCOUNTER — Encounter (HOSPITAL_COMMUNITY)
Admission: RE | Admit: 2012-03-10 | Discharge: 2012-03-10 | Disposition: A | Discharge status: Home / Self Care | Payer: Medicare Other | Source: Ambulatory Visit | Attending: Cardiology | Admitting: Cardiology

## 2012-03-13 ENCOUNTER — Encounter (HOSPITAL_COMMUNITY)
Admission: RE | Admit: 2012-03-13 | Discharge: 2012-03-13 | Disposition: A | Discharge status: Home / Self Care | Payer: Medicare Other | Source: Ambulatory Visit | Attending: Cardiology | Admitting: Cardiology

## 2012-03-13 NOTE — Progress Notes (Signed)
Pt talked to Dr. Sharyn Lull about holding his statin with no complaints of gi upset.  Pt took 1/2 of the dose, but continued to have discomfort.  Pt plans to talk back with Dr. Sharyn Lull about continued gi distrubance.

## 2012-03-15 ENCOUNTER — Encounter (HOSPITAL_COMMUNITY)
Admission: RE | Admit: 2012-03-15 | Discharge: 2012-03-15 | Disposition: A | Discharge status: Home / Self Care | Payer: Medicare Other | Source: Ambulatory Visit | Attending: Cardiology | Admitting: Cardiology

## 2012-03-17 ENCOUNTER — Encounter (HOSPITAL_COMMUNITY)
Admission: RE | Admit: 2012-03-17 | Discharge: 2012-03-17 | Disposition: A | Discharge status: Home / Self Care | Payer: Medicare Other | Source: Ambulatory Visit | Attending: Cardiology | Admitting: Cardiology

## 2012-03-20 ENCOUNTER — Encounter (HOSPITAL_COMMUNITY)
Admission: RE | Admit: 2012-03-20 | Discharge: 2012-03-20 | Disposition: A | Discharge status: Home / Self Care | Payer: Medicare Other | Source: Ambulatory Visit | Attending: Cardiology | Admitting: Cardiology

## 2012-03-22 ENCOUNTER — Encounter (HOSPITAL_COMMUNITY)
Admission: RE | Admit: 2012-03-22 | Discharge: 2012-03-22 | Disposition: A | Discharge status: Home / Self Care | Payer: Medicare Other | Source: Ambulatory Visit | Attending: Cardiology | Admitting: Cardiology

## 2012-03-24 ENCOUNTER — Encounter (HOSPITAL_COMMUNITY)
Admission: RE | Admit: 2012-03-24 | Discharge: 2012-03-24 | Disposition: A | Discharge status: Home / Self Care | Payer: Medicare Other | Source: Ambulatory Visit | Attending: Cardiology | Admitting: Cardiology

## 2012-03-27 ENCOUNTER — Encounter (HOSPITAL_COMMUNITY)
Admission: RE | Admit: 2012-03-27 | Discharge: 2012-03-27 | Disposition: A | Discharge status: Home / Self Care | Payer: Medicare Other | Source: Ambulatory Visit | Attending: Cardiology | Admitting: Cardiology

## 2012-03-27 DIAGNOSIS — E7800 Pure hypercholesterolemia, unspecified: Secondary | ICD-10-CM | POA: Diagnosis not present

## 2012-03-27 DIAGNOSIS — Z9861 Coronary angioplasty status: Secondary | ICD-10-CM | POA: Insufficient documentation

## 2012-03-27 DIAGNOSIS — I2510 Atherosclerotic heart disease of native coronary artery without angina pectoris: Secondary | ICD-10-CM | POA: Insufficient documentation

## 2012-03-27 DIAGNOSIS — I2109 ST elevation (STEMI) myocardial infarction involving other coronary artery of anterior wall: Secondary | ICD-10-CM | POA: Diagnosis not present

## 2012-03-27 DIAGNOSIS — Z87891 Personal history of nicotine dependence: Secondary | ICD-10-CM | POA: Insufficient documentation

## 2012-03-27 DIAGNOSIS — Z5189 Encounter for other specified aftercare: Secondary | ICD-10-CM | POA: Insufficient documentation

## 2012-03-29 ENCOUNTER — Encounter (HOSPITAL_COMMUNITY)
Admission: RE | Admit: 2012-03-29 | Discharge: 2012-03-29 | Disposition: A | Discharge status: Home / Self Care | Payer: Medicare Other | Source: Ambulatory Visit | Attending: Cardiology | Admitting: Cardiology

## 2012-03-31 ENCOUNTER — Encounter (HOSPITAL_COMMUNITY)
Admission: RE | Admit: 2012-03-31 | Discharge: 2012-03-31 | Disposition: A | Discharge status: Home / Self Care | Payer: Medicare Other | Source: Ambulatory Visit | Attending: Cardiology | Admitting: Cardiology

## 2012-04-03 ENCOUNTER — Encounter (HOSPITAL_COMMUNITY)
Admission: RE | Admit: 2012-04-03 | Discharge: 2012-04-03 | Disposition: A | Discharge status: Home / Self Care | Payer: Medicare Other | Source: Ambulatory Visit | Attending: Cardiology | Admitting: Cardiology

## 2012-04-05 ENCOUNTER — Encounter (HOSPITAL_COMMUNITY)
Admission: RE | Admit: 2012-04-05 | Discharge: 2012-04-05 | Disposition: A | Discharge status: Home / Self Care | Payer: Medicare Other | Source: Ambulatory Visit | Attending: Cardiology | Admitting: Cardiology

## 2012-04-07 ENCOUNTER — Encounter (HOSPITAL_COMMUNITY)
Admission: RE | Admit: 2012-04-07 | Discharge: 2012-04-07 | Disposition: A | Discharge status: Home / Self Care | Payer: Medicare Other | Source: Ambulatory Visit | Attending: Cardiology | Admitting: Cardiology

## 2012-04-10 ENCOUNTER — Encounter (HOSPITAL_COMMUNITY): Payer: Medicare Other

## 2012-04-12 ENCOUNTER — Encounter (HOSPITAL_COMMUNITY)
Admission: RE | Admit: 2012-04-12 | Discharge: 2012-04-12 | Disposition: A | Discharge status: Home / Self Care | Payer: Medicare Other | Source: Ambulatory Visit | Attending: Cardiology | Admitting: Cardiology

## 2012-04-14 ENCOUNTER — Encounter (HOSPITAL_COMMUNITY)
Admission: RE | Admit: 2012-04-14 | Discharge: 2012-04-14 | Disposition: A | Discharge status: Home / Self Care | Payer: Medicare Other | Source: Ambulatory Visit | Attending: Cardiology | Admitting: Cardiology

## 2012-04-17 ENCOUNTER — Encounter (HOSPITAL_COMMUNITY)
Admission: RE | Admit: 2012-04-17 | Discharge: 2012-04-17 | Disposition: A | Discharge status: Home / Self Care | Payer: Medicare Other | Source: Ambulatory Visit | Attending: Cardiology | Admitting: Cardiology

## 2012-04-19 ENCOUNTER — Encounter (HOSPITAL_COMMUNITY)
Admission: RE | Admit: 2012-04-19 | Discharge: 2012-04-19 | Disposition: A | Discharge status: Home / Self Care | Payer: Medicare Other | Source: Ambulatory Visit | Attending: Cardiology | Admitting: Cardiology

## 2012-04-20 ENCOUNTER — Encounter (HOSPITAL_COMMUNITY): Payer: Self-pay | Admitting: *Deleted

## 2012-04-21 ENCOUNTER — Encounter (HOSPITAL_COMMUNITY)
Admission: RE | Admit: 2012-04-21 | Discharge: 2012-04-21 | Disposition: A | Discharge status: Home / Self Care | Payer: Medicare Other | Source: Ambulatory Visit | Attending: Cardiology | Admitting: Cardiology

## 2012-04-24 ENCOUNTER — Encounter (HOSPITAL_COMMUNITY)
Admission: RE | Admit: 2012-04-24 | Discharge: 2012-04-24 | Disposition: A | Discharge status: Home / Self Care | Payer: Medicare Other | Source: Ambulatory Visit | Attending: Cardiology | Admitting: Cardiology

## 2012-04-26 ENCOUNTER — Encounter (HOSPITAL_COMMUNITY)
Admission: RE | Admit: 2012-04-26 | Discharge: 2012-04-26 | Disposition: A | Discharge status: Home / Self Care | Payer: Medicare Other | Source: Ambulatory Visit | Attending: Cardiology | Admitting: Cardiology

## 2012-04-28 ENCOUNTER — Encounter (HOSPITAL_COMMUNITY)
Admission: RE | Admit: 2012-04-28 | Discharge: 2012-04-28 | Disposition: A | Discharge status: Home / Self Care | Payer: Medicare Other | Source: Ambulatory Visit | Attending: Cardiology | Admitting: Cardiology

## 2012-04-28 DIAGNOSIS — Z9861 Coronary angioplasty status: Secondary | ICD-10-CM | POA: Diagnosis not present

## 2012-04-28 DIAGNOSIS — I2109 ST elevation (STEMI) myocardial infarction involving other coronary artery of anterior wall: Secondary | ICD-10-CM | POA: Diagnosis not present

## 2012-04-28 DIAGNOSIS — I2510 Atherosclerotic heart disease of native coronary artery without angina pectoris: Secondary | ICD-10-CM | POA: Diagnosis not present

## 2012-04-28 DIAGNOSIS — E7800 Pure hypercholesterolemia, unspecified: Secondary | ICD-10-CM | POA: Insufficient documentation

## 2012-04-28 DIAGNOSIS — Z5189 Encounter for other specified aftercare: Secondary | ICD-10-CM | POA: Diagnosis not present

## 2012-04-28 DIAGNOSIS — Z87891 Personal history of nicotine dependence: Secondary | ICD-10-CM | POA: Insufficient documentation

## 2012-05-01 ENCOUNTER — Encounter (HOSPITAL_COMMUNITY)
Admission: RE | Admit: 2012-05-01 | Discharge: 2012-05-01 | Disposition: A | Discharge status: Home / Self Care | Payer: Medicare Other | Source: Ambulatory Visit | Attending: Cardiology | Admitting: Cardiology

## 2012-05-03 ENCOUNTER — Encounter (HOSPITAL_COMMUNITY)
Admission: RE | Admit: 2012-05-03 | Discharge: 2012-05-03 | Disposition: A | Discharge status: Home / Self Care | Payer: Medicare Other | Source: Ambulatory Visit | Attending: Cardiology | Admitting: Cardiology

## 2012-05-05 ENCOUNTER — Encounter (HOSPITAL_COMMUNITY)
Admission: RE | Admit: 2012-05-05 | Discharge: 2012-05-05 | Disposition: A | Discharge status: Home / Self Care | Payer: Medicare Other | Source: Ambulatory Visit | Attending: Cardiology | Admitting: Cardiology

## 2012-05-08 ENCOUNTER — Encounter (HOSPITAL_COMMUNITY)
Admission: RE | Admit: 2012-05-08 | Discharge: 2012-05-08 | Disposition: A | Discharge status: Home / Self Care | Payer: Medicare Other | Source: Ambulatory Visit | Attending: Cardiology | Admitting: Cardiology

## 2012-05-10 ENCOUNTER — Encounter (HOSPITAL_COMMUNITY)
Admission: RE | Admit: 2012-05-10 | Discharge: 2012-05-10 | Disposition: A | Discharge status: Home / Self Care | Payer: Medicare Other | Source: Ambulatory Visit | Attending: Cardiology | Admitting: Cardiology

## 2012-05-12 ENCOUNTER — Encounter (HOSPITAL_COMMUNITY)
Admission: RE | Admit: 2012-05-12 | Discharge: 2012-05-12 | Disposition: A | Discharge status: Home / Self Care | Payer: Medicare Other | Source: Ambulatory Visit | Attending: Cardiology | Admitting: Cardiology

## 2012-05-12 DIAGNOSIS — I2510 Atherosclerotic heart disease of native coronary artery without angina pectoris: Secondary | ICD-10-CM | POA: Diagnosis not present

## 2012-05-12 DIAGNOSIS — I10 Essential (primary) hypertension: Secondary | ICD-10-CM | POA: Diagnosis not present

## 2012-05-12 DIAGNOSIS — E7800 Pure hypercholesterolemia, unspecified: Secondary | ICD-10-CM | POA: Diagnosis not present

## 2012-05-15 ENCOUNTER — Encounter (HOSPITAL_COMMUNITY)
Admission: RE | Admit: 2012-05-15 | Discharge: 2012-05-15 | Disposition: A | Discharge status: Home / Self Care | Payer: Medicare Other | Source: Ambulatory Visit | Attending: Cardiology | Admitting: Cardiology

## 2012-05-17 ENCOUNTER — Encounter (HOSPITAL_COMMUNITY)
Admission: RE | Admit: 2012-05-17 | Discharge: 2012-05-17 | Disposition: A | Discharge status: Home / Self Care | Payer: Medicare Other | Source: Ambulatory Visit | Attending: Cardiology | Admitting: Cardiology

## 2012-05-19 ENCOUNTER — Ambulatory Visit (HOSPITAL_COMMUNITY)
Admission: RE | Admit: 2012-05-19 | Discharge: 2012-05-19 | Disposition: A | Discharge status: Home / Self Care | Payer: Medicare Other | Source: Ambulatory Visit | Attending: Cardiology | Admitting: Cardiology

## 2012-05-19 ENCOUNTER — Encounter (HOSPITAL_COMMUNITY): Payer: Medicare Other

## 2012-05-19 DIAGNOSIS — I2109 ST elevation (STEMI) myocardial infarction involving other coronary artery of anterior wall: Secondary | ICD-10-CM | POA: Diagnosis not present

## 2012-05-19 DIAGNOSIS — Z5189 Encounter for other specified aftercare: Secondary | ICD-10-CM | POA: Diagnosis not present

## 2012-05-19 DIAGNOSIS — Z9861 Coronary angioplasty status: Secondary | ICD-10-CM | POA: Insufficient documentation

## 2012-05-19 DIAGNOSIS — I2510 Atherosclerotic heart disease of native coronary artery without angina pectoris: Secondary | ICD-10-CM | POA: Diagnosis not present

## 2012-05-19 DIAGNOSIS — Z87891 Personal history of nicotine dependence: Secondary | ICD-10-CM | POA: Diagnosis not present

## 2012-05-19 DIAGNOSIS — E7800 Pure hypercholesterolemia, unspecified: Secondary | ICD-10-CM | POA: Diagnosis not present

## 2012-05-22 ENCOUNTER — Encounter (HOSPITAL_COMMUNITY): Payer: Medicare Other

## 2012-05-23 DIAGNOSIS — I252 Old myocardial infarction: Secondary | ICD-10-CM | POA: Diagnosis not present

## 2012-05-23 DIAGNOSIS — I2510 Atherosclerotic heart disease of native coronary artery without angina pectoris: Secondary | ICD-10-CM | POA: Diagnosis not present

## 2012-05-23 DIAGNOSIS — I10 Essential (primary) hypertension: Secondary | ICD-10-CM | POA: Diagnosis not present

## 2012-05-23 DIAGNOSIS — E7800 Pure hypercholesterolemia, unspecified: Secondary | ICD-10-CM | POA: Diagnosis not present

## 2012-05-24 ENCOUNTER — Encounter (HOSPITAL_COMMUNITY): Payer: Medicare Other

## 2012-05-26 ENCOUNTER — Encounter (HOSPITAL_COMMUNITY): Payer: Medicare Other

## 2012-06-09 DIAGNOSIS — R0789 Other chest pain: Secondary | ICD-10-CM | POA: Diagnosis not present

## 2012-08-15 DIAGNOSIS — Z79899 Other long term (current) drug therapy: Secondary | ICD-10-CM | POA: Diagnosis not present

## 2012-08-15 DIAGNOSIS — R198 Other specified symptoms and signs involving the digestive system and abdomen: Secondary | ICD-10-CM | POA: Diagnosis not present

## 2012-09-13 DIAGNOSIS — I2510 Atherosclerotic heart disease of native coronary artery without angina pectoris: Secondary | ICD-10-CM | POA: Diagnosis not present

## 2012-09-13 DIAGNOSIS — I4892 Unspecified atrial flutter: Secondary | ICD-10-CM | POA: Diagnosis not present

## 2012-09-13 DIAGNOSIS — I10 Essential (primary) hypertension: Secondary | ICD-10-CM | POA: Diagnosis not present

## 2012-09-13 DIAGNOSIS — R238 Other skin changes: Secondary | ICD-10-CM | POA: Diagnosis not present

## 2012-09-13 DIAGNOSIS — E7800 Pure hypercholesterolemia, unspecified: Secondary | ICD-10-CM | POA: Diagnosis not present

## 2012-09-25 DIAGNOSIS — I10 Essential (primary) hypertension: Secondary | ICD-10-CM | POA: Diagnosis not present

## 2012-09-25 DIAGNOSIS — I252 Old myocardial infarction: Secondary | ICD-10-CM | POA: Diagnosis not present

## 2012-09-25 DIAGNOSIS — I2510 Atherosclerotic heart disease of native coronary artery without angina pectoris: Secondary | ICD-10-CM | POA: Diagnosis not present

## 2012-09-25 DIAGNOSIS — R0789 Other chest pain: Secondary | ICD-10-CM | POA: Diagnosis not present

## 2012-09-29 DIAGNOSIS — R0789 Other chest pain: Secondary | ICD-10-CM | POA: Diagnosis not present

## 2012-10-26 DIAGNOSIS — R071 Chest pain on breathing: Secondary | ICD-10-CM | POA: Diagnosis not present

## 2012-10-26 DIAGNOSIS — E7800 Pure hypercholesterolemia, unspecified: Secondary | ICD-10-CM | POA: Diagnosis not present

## 2012-10-26 DIAGNOSIS — I10 Essential (primary) hypertension: Secondary | ICD-10-CM | POA: Diagnosis not present

## 2013-02-02 DIAGNOSIS — M19019 Primary osteoarthritis, unspecified shoulder: Secondary | ICD-10-CM | POA: Diagnosis not present

## 2013-02-02 DIAGNOSIS — M479 Spondylosis, unspecified: Secondary | ICD-10-CM | POA: Diagnosis not present

## 2013-02-02 DIAGNOSIS — Z9861 Coronary angioplasty status: Secondary | ICD-10-CM | POA: Diagnosis not present

## 2013-02-02 DIAGNOSIS — R002 Palpitations: Secondary | ICD-10-CM | POA: Diagnosis not present

## 2013-02-03 DIAGNOSIS — R002 Palpitations: Secondary | ICD-10-CM | POA: Diagnosis not present

## 2013-02-03 DIAGNOSIS — M19019 Primary osteoarthritis, unspecified shoulder: Secondary | ICD-10-CM | POA: Diagnosis not present

## 2013-02-03 DIAGNOSIS — M479 Spondylosis, unspecified: Secondary | ICD-10-CM | POA: Diagnosis not present

## 2013-02-06 DIAGNOSIS — I10 Essential (primary) hypertension: Secondary | ICD-10-CM | POA: Diagnosis not present

## 2013-02-06 DIAGNOSIS — R002 Palpitations: Secondary | ICD-10-CM | POA: Diagnosis not present

## 2013-02-06 DIAGNOSIS — I2510 Atherosclerotic heart disease of native coronary artery without angina pectoris: Secondary | ICD-10-CM | POA: Diagnosis not present

## 2013-03-26 DIAGNOSIS — E7800 Pure hypercholesterolemia, unspecified: Secondary | ICD-10-CM | POA: Diagnosis not present

## 2013-03-26 DIAGNOSIS — I2510 Atherosclerotic heart disease of native coronary artery without angina pectoris: Secondary | ICD-10-CM | POA: Diagnosis not present

## 2013-03-26 DIAGNOSIS — I10 Essential (primary) hypertension: Secondary | ICD-10-CM | POA: Diagnosis not present

## 2013-03-26 DIAGNOSIS — I4892 Unspecified atrial flutter: Secondary | ICD-10-CM | POA: Diagnosis not present

## 2013-04-26 ENCOUNTER — Encounter (HOSPITAL_COMMUNITY): Payer: Self-pay | Admitting: Emergency Medicine

## 2013-04-26 ENCOUNTER — Emergency Department (HOSPITAL_COMMUNITY): Payer: Medicare Other

## 2013-04-26 ENCOUNTER — Emergency Department (HOSPITAL_COMMUNITY)
Admission: EM | Admit: 2013-04-26 | Discharge: 2013-04-26 | Disposition: A | Discharge status: Home / Self Care | Payer: Medicare Other | Attending: Emergency Medicine | Admitting: Emergency Medicine

## 2013-04-26 DIAGNOSIS — R0789 Other chest pain: Secondary | ICD-10-CM | POA: Diagnosis not present

## 2013-04-26 DIAGNOSIS — I2510 Atherosclerotic heart disease of native coronary artery without angina pectoris: Secondary | ICD-10-CM | POA: Diagnosis not present

## 2013-04-26 DIAGNOSIS — Z7982 Long term (current) use of aspirin: Secondary | ICD-10-CM | POA: Diagnosis not present

## 2013-04-26 DIAGNOSIS — R079 Chest pain, unspecified: Secondary | ICD-10-CM | POA: Diagnosis not present

## 2013-04-26 HISTORY — DX: Atherosclerotic heart disease of native coronary artery without angina pectoris: I25.10

## 2013-04-26 LAB — CBC
Hemoglobin: 14.5 g/dL (ref 13.0–17.0)
MCHC: 35.3 g/dL (ref 30.0–36.0)
Platelets: 285 10*3/uL (ref 150–400)
RDW: 12.6 % (ref 11.5–15.5)

## 2013-04-26 LAB — COMPREHENSIVE METABOLIC PANEL
ALT: 16 U/L (ref 0–53)
AST: 22 U/L (ref 0–37)
Albumin: 3.5 g/dL (ref 3.5–5.2)
Alkaline Phosphatase: 58 U/L (ref 39–117)
Glucose, Bld: 89 mg/dL (ref 70–99)
Potassium: 4.2 mEq/L (ref 3.5–5.1)
Sodium: 141 mEq/L (ref 135–145)
Total Protein: 7.3 g/dL (ref 6.0–8.3)

## 2013-04-26 LAB — POCT I-STAT TROPONIN I

## 2013-04-26 NOTE — ED Notes (Signed)
Pt c/o mid sternal CP with radiation to left side; pt sent from PCP for possible EKG changes; pt sts worse with movement and inspiration

## 2013-04-26 NOTE — ED Provider Notes (Addendum)
CSN: [id number]     Arrival date & time 04/26/13  1201 History     First MD Initiated Contact with Patient 04/26/13 1219     Chief Complaint  Patient presents with  . Chest Pain   (Consider location/radiation/quality/duration/timing/severity/associated sxs/prior Treatment) HPI Comments: Philip Parsons is a 72 y.o. male who presents for evaluation of chest pain. His chest discomfort started 3 days ago after he ate a meal, following, [place]. The pain has been intermittent since that time. It is described as a dull and burning sensation in the center of his chest, without radiation. He denies nausea, vomiting, weakness, dizziness, back pain and diaphoresis, changes in bowel or urinary habits. He took his usual morning medications. He went to see his doctor, who felt that he may have an acute cardiac abnormality, because his EKG was changed from prior. His doctor gave him aspirin. The patient chose to come by private vehicle. Despite his doctor's advice to come by EMS. There no other known modifying factors.  The history is provided by the patient.    Past Medical History  Diagnosis Date  . No pertinent past medical history   . Coronary artery disease    Past Surgical History  Procedure Laterality Date  . No past surgeries     History reviewed. No pertinent family history. History  Substance Use Topics  . Smoking status: Not on file  . Smokeless tobacco: Not on file  . Alcohol Use: No    Review of Systems  All other systems reviewed and are negative.    Allergies  Review of patient's allergies indicates no known allergies.  Home Medications   Current Outpatient Rx  Name  Route  Sig  Dispense  Refill  . aspirin 81 MG tablet   Oral   Take 81 mg by mouth daily.         . Multiple Vitamin (MULITIVITAMIN WITH MINERALS) TABS   Oral   Take 1 tablet by mouth daily.         Marland Kitchen EXPIRED: nitroGLYCERIN (NITROSTAT) 0.4 MG SL tablet   Sublingual   Place 1 tablet (0.4 mg  total) under the tongue every 5 (five) minutes as needed for chest pain (up to three doses, if pain continues go to the ER).   15 tablet   0    BP 184/75  Pulse 69  Temp(Src) 97.6 F (36.4 C) (Oral)  Resp 26  Ht 5\' 9"  (1.753 m)  Wt 160 lb (72.576 kg)  BMI 23.62 kg/m2  SpO2 99% Physical Exam  Nursing note and vitals reviewed. Constitutional: He is oriented to person, place, and time. He appears well-developed and well-nourished.  HENT:  Head: Normocephalic and atraumatic.  Right Ear: External ear normal.  Left Ear: External ear normal.  Eyes: Conjunctivae and EOM are normal. Pupils are equal, round, and reactive to light.  Neck: Normal range of motion and phonation normal. Neck supple.  Cardiovascular: Normal rate, regular rhythm, normal heart sounds and intact distal pulses.   Pulmonary/Chest: Effort normal and breath sounds normal. He exhibits no bony tenderness.  Abdominal: Soft. Normal appearance. There is no tenderness.  Musculoskeletal: Normal range of motion.  Neurological: He is alert and oriented to person, place, and time. He has normal strength. No cranial nerve deficit or sensory deficit. He exhibits normal muscle tone. Coordination normal.  Skin: Skin is warm, dry and intact.  Psychiatric: He has a normal mood and affect. His behavior is normal. Judgment and thought  content normal.    ED Course   Procedures (including critical care time)  3:16 PM-Consult complete with Dr. Anne Fu. Patient case explained and discussed.   Case was discussed: He feels that the patient is stable for discharge and that the patient  can followup in the office for further evaluation and treatment. Call ended at 1530    Date: 04/26/13  Rate: 61  Rhythm: normal sinus rhythm  QRS Axis: normal  PR and QT Intervals: normal  ST/T Wave abnormalities: normal  PR and QRS Conduction Disutrbances:Incomplete right bundle-branch block  Narrative Interpretation:   Old EKG Reviewed: unchanged-  09/02/11   At discharge- Reevaluation with update and discussion. After initial assessment and treatment, an updated evaluation reveals patient is comfortable and denies chest pain, currently. Philip Parsons   Labs Reviewed  COMPREHENSIVE METABOLIC PANEL - Abnormal; Notable for the following:    GFR calc non Af Amer 53 (*)    GFR calc Af Amer 62 (*)    All other components within normal limits  CBC  POCT I-STAT TROPONIN I  POCT I-STAT TROPONIN I   Dg Chest Port 1 View  04/26/2013   *RADIOLOGY REPORT*  Clinical Data: Chest tightness, history of coronary stent  PORTABLE CHEST - 1 VIEW  Comparison: 02/02/2012  Findings: Normal heart size and vascularity.  Negative for edema or pneumonia.  No effusion or pneumothorax.  Trachea is midline. Advanced degenerative joint disease of the right shoulder.  IMPRESSION: No acute chest process.   Original Report Authenticated By: Judie Petit. Shick, M.D.   1. Chest pain     MDM  Chest pain, atypical for cardiac disease, with reassuring findings on ED evaluation. Additionally, the patient has had a normal stress test in January 2014. It is unlikely that this represents an acute coronary syndrome. Doubt metabolic instability, serious bacterial infection or impending vascular collapse; the patient is stable for discharge.   Nursing Notes Reviewed/ Care Coordinated, and agree without changes. Applicable Imaging Reviewed.  Interpretation of Laboratory Data incorporated into ED treatment   Plan: Home Medications-  usual ; Home Treatments and Observation-resume usual activities when feeling better, watch for progressive symptoms; return here if the recommended treatment, does not improve the symptoms; Recommended follow up- cardiology, one week    Flint Melter, MD 04/26/13 1717  Flint Melter, MD 04/26/13 [phone]

## 2013-05-01 DIAGNOSIS — I2510 Atherosclerotic heart disease of native coronary artery without angina pectoris: Secondary | ICD-10-CM | POA: Diagnosis not present

## 2013-05-01 DIAGNOSIS — R079 Chest pain, unspecified: Secondary | ICD-10-CM | POA: Diagnosis not present

## 2013-05-01 DIAGNOSIS — I252 Old myocardial infarction: Secondary | ICD-10-CM | POA: Diagnosis not present

## 2013-05-03 ENCOUNTER — Encounter (HOSPITAL_COMMUNITY): Payer: Self-pay | Admitting: Pharmacy Technician

## 2013-05-07 ENCOUNTER — Other Ambulatory Visit: Payer: Self-pay | Admitting: Interventional Cardiology

## 2013-05-07 DIAGNOSIS — R079 Chest pain, unspecified: Secondary | ICD-10-CM | POA: Diagnosis not present

## 2013-05-10 ENCOUNTER — Ambulatory Visit (HOSPITAL_COMMUNITY)
Admission: RE | Admit: 2013-05-10 | Discharge: 2013-05-10 | Disposition: A | Discharge status: Home / Self Care | Payer: Medicare Other | Source: Ambulatory Visit | Attending: Interventional Cardiology | Admitting: Interventional Cardiology

## 2013-05-10 ENCOUNTER — Encounter (HOSPITAL_COMMUNITY)
Admission: RE | Disposition: A | Discharge status: Home / Self Care | Payer: Self-pay | Source: Ambulatory Visit | Attending: Interventional Cardiology

## 2013-05-10 DIAGNOSIS — I2510 Atherosclerotic heart disease of native coronary artery without angina pectoris: Secondary | ICD-10-CM | POA: Diagnosis not present

## 2013-05-10 DIAGNOSIS — I252 Old myocardial infarction: Secondary | ICD-10-CM | POA: Insufficient documentation

## 2013-05-10 DIAGNOSIS — Z9861 Coronary angioplasty status: Secondary | ICD-10-CM | POA: Insufficient documentation

## 2013-05-10 DIAGNOSIS — R079 Chest pain, unspecified: Secondary | ICD-10-CM | POA: Insufficient documentation

## 2013-05-10 HISTORY — PX: LEFT HEART CATHETERIZATION WITH CORONARY ANGIOGRAM: SHX5451

## 2013-05-10 SURGERY — LEFT HEART CATHETERIZATION WITH CORONARY ANGIOGRAM
Anesthesia: LOCAL

## 2013-05-10 MED ORDER — DIAZEPAM 5 MG PO TABS
5.0000 mg | ORAL_TABLET | ORAL | Status: DC
  Filled 2013-05-10: qty 1

## 2013-05-10 MED ORDER — ACETAMINOPHEN 325 MG PO TABS: 650.0000 mg | ORAL_TABLET | ORAL | Status: DC | PRN

## 2013-05-10 MED ORDER — ASPIRIN 81 MG PO CHEW
324.0000 mg | CHEWABLE_TABLET | ORAL | Status: AC
  Administered 2013-05-10: 324 mg via ORAL
  Filled 2013-05-10: qty 4

## 2013-05-10 MED ORDER — LIDOCAINE HCL (PF) 1 % IJ SOLN
INTRAMUSCULAR | Status: AC
  Filled 2013-05-10: qty 30

## 2013-05-10 MED ORDER — SODIUM CHLORIDE 0.9 % IJ SOLN
3.0000 mL | Freq: Two times a day (BID) | INTRAMUSCULAR | Status: DC
Start: 2013-05-10 — End: 2013-05-10

## 2013-05-10 MED ORDER — ONDANSETRON HCL 4 MG/2ML IJ SOLN: 4.0000 mg | Freq: Four times a day (QID) | INTRAMUSCULAR | Status: DC | PRN

## 2013-05-10 MED ORDER — HEPARIN (PORCINE) IN NACL 2-0.9 UNIT/ML-% IJ SOLN
INTRAMUSCULAR | Status: AC
  Filled 2013-05-10: qty 1000

## 2013-05-10 MED ORDER — NITROGLYCERIN 0.2 MG/ML ON CALL CATH LAB
INTRAVENOUS | Status: AC
  Filled 2013-05-10: qty 1

## 2013-05-10 MED ORDER — VERAPAMIL HCL 2.5 MG/ML IV SOLN
INTRAVENOUS | Status: AC
  Filled 2013-05-10: qty 2

## 2013-05-10 MED ORDER — SODIUM CHLORIDE 0.9 % IJ SOLN: 3.0000 mL | INTRAMUSCULAR | Status: DC | PRN

## 2013-05-10 MED ORDER — SODIUM CHLORIDE 0.9 % IV SOLN
INTRAVENOUS | Status: DC
  Administered 2013-05-10: 08:00:00 via INTRAVENOUS

## 2013-05-10 MED ORDER — HEPARIN SODIUM (PORCINE) 1000 UNIT/ML IJ SOLN
INTRAMUSCULAR | Status: AC
  Filled 2013-05-10: qty 1

## 2013-05-10 MED ORDER — SODIUM CHLORIDE 0.9 % IV SOLN: 250.0000 mL | INTRAVENOUS | Status: DC | PRN

## 2013-05-10 MED ORDER — MIDAZOLAM HCL 2 MG/2ML IJ SOLN
INTRAMUSCULAR | Status: AC
  Filled 2013-05-10: qty 2

## 2013-05-10 MED ORDER — SODIUM CHLORIDE 0.9 % IV SOLN: INTRAVENOUS | Status: AC

## 2013-05-10 MED ORDER — OXYCODONE-ACETAMINOPHEN 5-325 MG PO TABS: 1.0000 | ORAL_TABLET | ORAL | Status: DC | PRN

## 2013-05-10 MED ORDER — FENTANYL CITRATE 0.05 MG/ML IJ SOLN
INTRAMUSCULAR | Status: AC
  Filled 2013-05-10: qty 2

## 2013-05-10 NOTE — CV Procedure (Signed)
     Diagnostic Cardiac Catheterization Report  JAKWON GAYTON  72 y.o.  male 04/14/1941  Procedure Date: 05/10/2013 Referring Physician: Blair Heys M.D. Primary Cardiologist:: HWB Leia Alf, M.D.   PROCEDURE:  Left heart catheterization with selective coronary angiography, left ventriculogram.  INDICATIONS:  Recurring chest pain, prior history of LAD drug-eluting stent implantation 16 months ago, and relatively normal stress tests 6 months ago. Recurring office visits and hospital ER visits for chest discomfort. Stress test did not reveal evidence of ischemia in January. The studies being done to document stent patency to be certain we are not dealing with ischemic symptoms perhaps some collateralized territory.  The risks, benefits, and details of the procedure were explained to the patient.  The patient verbalized understanding and wanted to proceed.  Informed written consent was obtained.  PROCEDURE TECHNIQUE:  After Xylocaine anesthesia a 5 French slender radial sheath was placed in the right radial artery with a single anterior needle wall stick.   Coronary angiography was done using a 5 [place] A2 MP catheter.  Left ventriculography was done using a 5 [place] A2 MP catheter.    CONTRAST:  Total of 47 cc.  COMPLICATIONS:  None.    HEMODYNAMICS:  Aortic pressure was 128/60; LV pressure was 125/6 mmHg; LVEDP 11 mm mercury.  There was no gradient between the left ventricle and aorta.    ANGIOGRAPHIC DATA:   The left main coronary artery is widely patent with some proximal ostial irregularity.  The left anterior descending artery reveals a relatively long ostial to proximal stent is present by cinefluoroscopy. The stent is widely patent. The LAD reaches the apex. The diagonals are patent. Streaming flow was noted in the distal LAD. This is likely due to using the [facility] contrast conservation device.  The left circumflex artery is large in size. 3 marginal branches arise. There  is ostial 20% narrowing in the first marginal the third marginal, distally contains ostial 30% narrowing. No significant obstruction is noted in the vessel.  The right coronary artery is codominant and widely patent. Luminal irregularities are noted to.  LEFT VENTRICULOGRAM:  Left ventricular angiogram was done in the 30 RAO projection and revealed normal left ventricular wall motion and systolic function with an estimated ejection fraction of 55%.  LVEDP was 11 mmHg.  IMPRESSIONS:  1. Widely patent LAD stent.  2. No significant obstructive disease noted in each of the 3 coronary territories. The left main is widely patent. There is no obstructive disease present to produce recurrent chest pain.  3. Normal left ventricular function   RECOMMENDATION:  Continue current therapy. Reassurance.Marland Kitchen

## 2013-05-10 NOTE — H&P (Signed)
Cath Lab Visit (complete for each Cath Lab visit)  Clinical Evaluation Leading to the Procedure:   ACS: no  Non-ACS:    Anginal Classification: CCS II  Anti-ischemic medical therapy: Minimal Therapy (1 class of medications)  Non-Invasive Test Results: Low-risk stress test findings: cardiac mortality <1%/year  Prior CABG: No previous CABG   The patient is 72 years of age and suffered a anterior myocardial infarction in May 2013 and was treated with a relatively long drug-eluting stents from the ostial LAD to the proximal third of the vessel. He was initially asymptomatic after PCI. Over the past 6-7 months he has had recurring episodes of chest discomfort. 6 months ago he underwent exercise treadmill testing that was nonischemic. He recently had an overnight hospital stay for recurrent chest discomfort similar to prior. He is undergone coronary angiography to determine if there is evidence of in-stent restenosis. He has been relatively asymptomatic over the past several days.  The procedure and risks were discussed with the patient in detail including the possibility of stroke, death, myocardial infarction, contrast allergy, kidney impairment, bleeding, among others. The patient accepts these risks and is willing to proceed.

## 2013-06-18 DIAGNOSIS — M79609 Pain in unspecified limb: Secondary | ICD-10-CM | POA: Diagnosis not present

## 2013-06-18 DIAGNOSIS — Z0000 Encounter for general adult medical examination without abnormal findings: Secondary | ICD-10-CM | POA: Diagnosis not present

## 2013-07-11 ENCOUNTER — Ambulatory Visit
Admission: RE | Admit: 2013-07-11 | Discharge: 2013-07-11 | Disposition: A | Discharge status: Home / Self Care | Payer: Medicare Other | Source: Ambulatory Visit | Attending: Family Medicine | Admitting: Family Medicine

## 2013-07-11 ENCOUNTER — Other Ambulatory Visit: Payer: Self-pay | Admitting: Family Medicine

## 2013-07-11 DIAGNOSIS — M25552 Pain in left hip: Secondary | ICD-10-CM

## 2013-07-11 DIAGNOSIS — M169 Osteoarthritis of hip, unspecified: Secondary | ICD-10-CM | POA: Diagnosis not present

## 2013-07-11 DIAGNOSIS — M79609 Pain in unspecified limb: Secondary | ICD-10-CM | POA: Diagnosis not present

## 2013-07-23 DIAGNOSIS — E559 Vitamin D deficiency, unspecified: Secondary | ICD-10-CM | POA: Diagnosis not present

## 2013-07-23 DIAGNOSIS — N401 Enlarged prostate with lower urinary tract symptoms: Secondary | ICD-10-CM | POA: Diagnosis not present

## 2013-07-23 DIAGNOSIS — N139 Obstructive and reflux uropathy, unspecified: Secondary | ICD-10-CM | POA: Diagnosis not present

## 2013-07-24 ENCOUNTER — Telehealth: Payer: Self-pay | Admitting: Interventional Cardiology

## 2013-07-24 DIAGNOSIS — M25559 Pain in unspecified hip: Secondary | ICD-10-CM | POA: Diagnosis not present

## 2013-07-24 NOTE — Telephone Encounter (Signed)
New message    Sports medicine doc recommend pt take melozicam 15mg .  Is is ok with dr Stephens Shire?

## 2013-07-25 NOTE — Telephone Encounter (Signed)
Pt called 07/24/13 wanting to know if it was ok to take meloxicam. Pt walked in to the office today. Pt adv per Dr.Smith ok to take meloxicam for a short period of time ex. 1 week. Not ok for extended period of time based on pt cardiac hx. Pt verbalized understanding

## 2013-07-31 ENCOUNTER — Encounter: Payer: Self-pay | Admitting: Interventional Cardiology

## 2013-08-03 DIAGNOSIS — N139 Obstructive and reflux uropathy, unspecified: Secondary | ICD-10-CM | POA: Diagnosis not present

## 2013-08-03 DIAGNOSIS — N401 Enlarged prostate with lower urinary tract symptoms: Secondary | ICD-10-CM | POA: Diagnosis not present

## 2013-08-03 DIAGNOSIS — E559 Vitamin D deficiency, unspecified: Secondary | ICD-10-CM | POA: Diagnosis not present

## 2013-08-14 DIAGNOSIS — M169 Osteoarthritis of hip, unspecified: Secondary | ICD-10-CM | POA: Diagnosis not present

## 2013-08-14 DIAGNOSIS — M25559 Pain in unspecified hip: Secondary | ICD-10-CM | POA: Diagnosis not present

## 2013-09-11 ENCOUNTER — Telehealth: Payer: Self-pay | Admitting: Interventional Cardiology

## 2013-09-11 NOTE — Telephone Encounter (Signed)
New Problem:  Pt is c/o leg pain and wants to know if he can take Alieve as needed. Pt wants to know if it's ok for him to take after having his stint.

## 2013-09-11 NOTE — Telephone Encounter (Signed)
pt given Dr.Smith instructions to use Tylenol for pain.no NSIADs pt verbalized understanding.

## 2013-10-11 ENCOUNTER — Telehealth: Payer: Self-pay

## 2013-10-11 DIAGNOSIS — M1610 Unilateral primary osteoarthritis, unspecified hip: Secondary | ICD-10-CM | POA: Diagnosis not present

## 2013-10-11 NOTE — Telephone Encounter (Signed)
regarding his symptoms.pt sts that his felt his heart racing and some chest tightness that has since subsided wo/nitro.pt appt moved up to 10/17/13 @3 :45 amd lab appt moved to 10/15/13.Dr.Smith aware of pt symptoms ana appt change.pt agreeable with plan and verbalized understanding.

## 2013-10-12 DIAGNOSIS — H2510 Age-related nuclear cataract, unspecified eye: Secondary | ICD-10-CM | POA: Diagnosis not present

## 2013-10-13 ENCOUNTER — Other Ambulatory Visit: Payer: Self-pay | Admitting: *Deleted

## 2013-10-13 DIAGNOSIS — E7800 Pure hypercholesterolemia, unspecified: Secondary | ICD-10-CM

## 2013-10-13 DIAGNOSIS — Z79899 Other long term (current) drug therapy: Secondary | ICD-10-CM

## 2013-10-15 ENCOUNTER — Other Ambulatory Visit (INDEPENDENT_AMBULATORY_CARE_PROVIDER_SITE_OTHER): Payer: Medicare Other

## 2013-10-15 DIAGNOSIS — E7800 Pure hypercholesterolemia, unspecified: Secondary | ICD-10-CM

## 2013-10-15 DIAGNOSIS — Z79899 Other long term (current) drug therapy: Secondary | ICD-10-CM | POA: Diagnosis not present

## 2013-10-15 LAB — LDL CHOLESTEROL, DIRECT: Direct LDL: 152.5 mg/dL

## 2013-10-15 LAB — HEPATIC FUNCTION PANEL
ALK PHOS: 56 U/L (ref 39–117)
ALT: 17 U/L (ref 0–53)
AST: 20 U/L (ref 0–37)
Albumin: 3.8 g/dL (ref 3.5–5.2)
BILIRUBIN DIRECT: 0 mg/dL (ref 0.0–0.3)
BILIRUBIN TOTAL: 0.7 mg/dL (ref 0.3–1.2)
TOTAL PROTEIN: 7.9 g/dL (ref 6.0–8.3)

## 2013-10-15 LAB — LIPID PANEL
Cholesterol: 227 mg/dL — ABNORMAL HIGH (ref 0–200)
HDL: 60.6 mg/dL (ref >39.00)
Total CHOL/HDL Ratio: 4
Triglycerides: 93 mg/dL (ref 0.0–149.0)
VLDL: 18.6 mg/dL (ref 0.0–40.0)

## 2013-10-16 ENCOUNTER — Ambulatory Visit: Payer: Medicare Other | Admitting: Interventional Cardiology

## 2013-10-17 ENCOUNTER — Encounter: Payer: Self-pay | Admitting: Interventional Cardiology

## 2013-10-17 ENCOUNTER — Ambulatory Visit (INDEPENDENT_AMBULATORY_CARE_PROVIDER_SITE_OTHER): Payer: Medicare Other | Admitting: Interventional Cardiology

## 2013-10-17 ENCOUNTER — Other Ambulatory Visit: Payer: Self-pay

## 2013-10-17 VITALS — BP 146/69 | HR 59 | Ht 69.0 in | Wt 168.0 lb

## 2013-10-17 DIAGNOSIS — E785 Hyperlipidemia, unspecified: Secondary | ICD-10-CM

## 2013-10-17 DIAGNOSIS — I2109 ST elevation (STEMI) myocardial infarction involving other coronary artery of anterior wall: Secondary | ICD-10-CM

## 2013-10-17 DIAGNOSIS — I2510 Atherosclerotic heart disease of native coronary artery without angina pectoris: Secondary | ICD-10-CM | POA: Diagnosis not present

## 2013-10-17 MED ORDER — ATORVASTATIN CALCIUM 10 MG PO TABS: 10.0000 mg | ORAL_TABLET | Freq: Every day | ORAL | Status: DC

## 2013-10-17 MED ORDER — NITROGLYCERIN 0.4 MG SL SUBL: 0.4000 mg | SUBLINGUAL_TABLET | SUBLINGUAL | Status: DC | PRN

## 2013-10-17 NOTE — Patient Instructions (Signed)
Start Atorvastatin 5 mg daily  Your physician recommends that you return for a FASTING lipid profile and alt: 12/17/13 you can come in anytime that day the lab is open from 7:30-5:30.  Your physician wants you to follow-up in: 1 year You will receive a reminder letter in the mail two months in advance. If you don't receive a letter, please call our office to schedule the follow-up appointment.

## 2013-10-17 NOTE — Progress Notes (Signed)
Patient ID: Philip Parsons, male   DOB: 1940-12-22, 73 y.o.   MRN: [id number]    [address] [place], [place]  [address] Phone: [phone] Fax:  [phone]  Date:  10/17/2013   ID:  Philip Parsons, DOB [birth year], MRN [id number]  PCP:  Thora Lance, MD   ASSESSMENT:  1. Coronary artery disease, asymptomatic, with prior coronary stent. Now off antiplatelet therapy other than aspirin. LAD DES 2013, May.  2. Hyperlipidemia, an acceptable with LDL of 154 and total cholesterol of 227  3. Elevated systolic blood pressure  PLAN:  1. Resume atorvastatin 10 mg daily 2. Statin panel in 2 months. 3. Maintain an active lifestyle 4. Refill nitroglycerin as needed 5. Monitor blood pressure closely and call if systolic remains consistently above 140   SUBJECTIVE: Philip Parsons is a 73 y.o. male who has history of coronary stent in the LAD during anterior myocardial infarction. Has had a subsequent repeat catheterization demonstrating a widely patent stent within the past 12 months. He has no cardiopulmonary complaints. His recent lipid panel demonstrated an LDL greater than 120 with total cholesterol of 222. He had been on atorvastatin in the past but has discontinued the medication. He has not been as physically active. He has gained a small amount of weight. He denies angina. No dyspnea. No palpitations or syncope.   Wt Readings from Last 3 Encounters:  10/17/13 168 lb (76.204 kg)  05/10/13 160 lb (72.576 kg)  05/10/13 160 lb (72.576 kg)     Past Medical History  Diagnosis Date  . No pertinent past medical history   . Coronary artery disease     Current Outpatient Prescriptions  Medication Sig Dispense Refill  . aspirin 81 MG tablet Take 81 mg by mouth daily.      . nitroGLYCERIN (NITROSTAT) 0.4 MG SL tablet Place 1 tablet (0.4 mg total) under the tongue every 5 (five) minutes as needed for chest pain (up to three doses, if pain continues go to the ER).  15 tablet  0    No current facility-administered medications for this visit.    Allergies:   No Known Allergies  Social History:  The patient  reports that he has quit smoking. He does not have any smokeless tobacco history on file. He reports that he does not drink alcohol or use illicit drugs.   ROS:  Please see the history of present illness. No tachycardia or palpitations. He denies transient neurological complaints.    All other systems reviewed and negative.   OBJECTIVE: VS:  BP 146/69  Pulse 59  Ht 5\' 9"  (1.753 m)  Wt 168 lb (76.204 kg)  BMI 24.80 kg/m2 Well nourished, well developed, in no acute distress, normal and healthy appearing HEENT: normal Neck: JVD flat. Carotid bruit absent  Cardiac:  normal S1, S2; RRR; no murmur Lungs:  clear to auscultation bilaterally, no wheezing, rhonchi or rales Abd: soft, nontender, no hepatomegaly Ext: Edema absent. Pulses 2+ bilateral Skin: warm and dry Neuro:  CNs 2-12 intact, no focal abnormalities noted  EKG:  Sinus bradycardia, otherwise normal     Past Medical History  Degenerative joint disease of right shoulder   Bacterial endocarditis prophylaxis 05/2004   Supraventricular tachycardia   Atrial flutter-07/2004   CAD with anterior MI treated with LAD DES 01/2012. LVEF at cath 50-55%   Hyperlipidemia   Elevated BP   Gout     Signed, Darci Needle III, MD 10/17/2013  4:20 PM

## 2013-10-26 ENCOUNTER — Other Ambulatory Visit: Payer: Medicare Other

## 2013-11-01 ENCOUNTER — Ambulatory Visit: Payer: Medicare Other | Admitting: Interventional Cardiology

## 2013-12-17 ENCOUNTER — Other Ambulatory Visit (INDEPENDENT_AMBULATORY_CARE_PROVIDER_SITE_OTHER): Payer: Medicare Other

## 2013-12-17 DIAGNOSIS — E785 Hyperlipidemia, unspecified: Secondary | ICD-10-CM | POA: Diagnosis not present

## 2013-12-17 LAB — LIPID PANEL
CHOL/HDL RATIO: 3
CHOLESTEROL: 160 mg/dL (ref 0–200)
HDL: 55.8 mg/dL (ref >39.00)
LDL CALC: 88 mg/dL (ref 0–99)
Triglycerides: 81 mg/dL (ref 0.0–149.0)
VLDL: 16.2 mg/dL (ref 0.0–40.0)

## 2013-12-17 LAB — ALT: ALT: 16 U/L (ref 0–53)

## 2013-12-18 ENCOUNTER — Telehealth: Payer: Self-pay | Admitting: Interventional Cardiology

## 2013-12-18 NOTE — Telephone Encounter (Signed)
New message  ° ° °Patient calling for test results.   °

## 2013-12-20 NOTE — Telephone Encounter (Signed)
Follow up          Pt calling back for test results of lab

## 2013-12-20 NOTE — Telephone Encounter (Signed)
pt given preliminary lab results.adv pt once reviewed by Dr.Smith I will call back with any recommendations.pt verbalized understanding.

## 2013-12-21 NOTE — Telephone Encounter (Signed)
Labs are at /near [facility] and need f/u in 1 year.

## 2013-12-26 NOTE — Telephone Encounter (Signed)
Copy of labs mailed to pt.

## 2014-04-04 ENCOUNTER — Telehealth: Payer: Self-pay | Admitting: Interventional Cardiology

## 2014-04-04 NOTE — Telephone Encounter (Signed)
New message     Talk to the nurse.  He has some questions.

## 2014-04-04 NOTE — Telephone Encounter (Signed)
returned pt call pt sts that he is having mild chest discomfort.pt negative for all other symptoms.pt would like an appt with Dr.Smith. appt sch for 7/30 @3pm .pt adv to call the office if symptoms worsen. pt agreeable with plan and verbalized understanding

## 2014-04-09 ENCOUNTER — Encounter (HOSPITAL_COMMUNITY): Payer: Self-pay | Admitting: Emergency Medicine

## 2014-04-09 ENCOUNTER — Emergency Department (HOSPITAL_COMMUNITY)
Admission: EM | Admit: 2014-04-09 | Discharge: 2014-04-09 | Disposition: A | Discharge status: Home / Self Care | Payer: Medicare Other | Attending: Emergency Medicine | Admitting: Emergency Medicine

## 2014-04-09 ENCOUNTER — Emergency Department (HOSPITAL_COMMUNITY): Payer: Medicare Other

## 2014-04-09 DIAGNOSIS — IMO0001 Reserved for inherently not codable concepts without codable children: Secondary | ICD-10-CM | POA: Insufficient documentation

## 2014-04-09 DIAGNOSIS — Z87891 Personal history of nicotine dependence: Secondary | ICD-10-CM | POA: Diagnosis not present

## 2014-04-09 DIAGNOSIS — Z79899 Other long term (current) drug therapy: Secondary | ICD-10-CM | POA: Diagnosis not present

## 2014-04-09 DIAGNOSIS — I2510 Atherosclerotic heart disease of native coronary artery without angina pectoris: Secondary | ICD-10-CM | POA: Diagnosis not present

## 2014-04-09 DIAGNOSIS — M546 Pain in thoracic spine: Secondary | ICD-10-CM | POA: Insufficient documentation

## 2014-04-09 DIAGNOSIS — Z7982 Long term (current) use of aspirin: Secondary | ICD-10-CM | POA: Diagnosis not present

## 2014-04-09 DIAGNOSIS — Z9861 Coronary angioplasty status: Secondary | ICD-10-CM | POA: Insufficient documentation

## 2014-04-09 DIAGNOSIS — R0789 Other chest pain: Secondary | ICD-10-CM

## 2014-04-09 DIAGNOSIS — R079 Chest pain, unspecified: Secondary | ICD-10-CM | POA: Diagnosis not present

## 2014-04-09 HISTORY — DX: Presence of other vascular implants and grafts: Z95.828

## 2014-04-09 LAB — CBC WITH DIFFERENTIAL/PLATELET
BASOS ABS: 0 10*3/uL (ref 0.0–0.1)
Basophils Relative: 0 % (ref 0–1)
EOS PCT: 4 % (ref 0–5)
Eosinophils Absolute: 0.3 10*3/uL (ref 0.0–0.7)
HCT: 39.8 % (ref 39.0–52.0)
Hemoglobin: 13.2 g/dL (ref 13.0–17.0)
LYMPHS PCT: 26 % (ref 12–46)
Lymphs Abs: 1.8 10*3/uL (ref 0.7–4.0)
MCH: 29.8 pg (ref 26.0–34.0)
MCHC: 33.2 g/dL (ref 30.0–36.0)
MCV: 89.8 fL (ref 78.0–100.0)
Monocytes Absolute: 0.8 10*3/uL (ref 0.1–1.0)
Monocytes Relative: 11 % (ref 3–12)
NEUTROS PCT: 59 % (ref 43–77)
Neutro Abs: 4 10*3/uL (ref 1.7–7.7)
PLATELETS: 317 10*3/uL (ref 150–400)
RBC: 4.43 MIL/uL (ref 4.22–5.81)
RDW: 12.4 % (ref 11.5–15.5)
WBC: 6.9 10*3/uL (ref 4.0–10.5)

## 2014-04-09 LAB — COMPREHENSIVE METABOLIC PANEL
ALK PHOS: 62 U/L (ref 39–117)
ALT: 15 U/L (ref 0–53)
AST: 19 U/L (ref 0–37)
Albumin: 3.4 g/dL — ABNORMAL LOW (ref 3.5–5.2)
Anion gap: 12 (ref 5–15)
BUN: 28 mg/dL — ABNORMAL HIGH (ref 6–23)
CALCIUM: 9.5 mg/dL (ref 8.4–10.5)
CO2: 28 meq/L (ref 19–32)
Chloride: 100 mEq/L (ref 96–112)
Creatinine, Ser: 1.25 mg/dL (ref 0.50–1.35)
GFR calc Af Amer: 63 mL/min — ABNORMAL LOW (ref >90)
GFR, EST NON AFRICAN AMERICAN: 55 mL/min — AB (ref >90)
Glucose, Bld: 109 mg/dL — ABNORMAL HIGH (ref 70–99)
POTASSIUM: 4.2 meq/L (ref 3.7–5.3)
SODIUM: 140 meq/L (ref 137–147)
TOTAL PROTEIN: 7.6 g/dL (ref 6.0–8.3)
Total Bilirubin: 0.2 mg/dL — ABNORMAL LOW (ref 0.3–1.2)

## 2014-04-09 LAB — TROPONIN I: Troponin I: <0.3 ng/mL (ref <0.30)

## 2014-04-09 NOTE — ED Notes (Signed)
Patient is alert and oriented x3.  He is complaining of a intermittent chest and back pain that is not localized. Patient states that he has a history of cardiac issues and wanted to make sure this was not cardiac related. Patient describes that pain in his chest as totally different than when he had a stent placed May 8th 2013.  Patient adds that the pain moves around his chest and into his back.  Currently he rates his pain 3 of 10.

## 2014-04-09 NOTE — ED Provider Notes (Signed)
CSN: [id number]     Arrival date & time 04/09/14  0114 History   First MD Initiated Contact with Patient 04/09/14 0141     Chief Complaint  Patient presents with  . Chest Pain  . Back Pain     (Consider location/radiation/quality/duration/timing/severity/associated sxs/prior Treatment) HPI Patient presents with several days of intermittent chest and thoracic back pain. He states it feels muscular in nature and worse when he moves. He's had a history of coronary artery disease and states this feels nothing like his previous cardiac type pain. He's had no shortness of breath, fever or chills. He denies any cough. No direct trauma. No lower extremity swelling or pain. No Recent extended travel. Past Medical History  Diagnosis Date  . No pertinent past medical history   . Coronary artery disease   . History of intravascular stent placement    Past Surgical History  Procedure Laterality Date  . No past surgeries     History reviewed. No pertinent family history. History  Substance Use Topics  . Smoking status: Former Games developermoker  . Smokeless tobacco: Not on file  . Alcohol Use: No    Review of Systems  Constitutional: Negative for fever and chills.  Respiratory: Negative for cough, shortness of breath and wheezing.   Cardiovascular: Positive for chest pain. Negative for palpitations.  Gastrointestinal: Negative for nausea, vomiting, abdominal pain and diarrhea.  Musculoskeletal: Positive for myalgias. Negative for back pain, neck pain and neck stiffness.  Skin: Negative for rash and wound.  Neurological: Negative for dizziness, weakness and numbness.  All other systems reviewed and are negative.     Allergies  Review of patient's allergies indicates no known allergies.  Home Medications   Prior to Admission medications   Medication Sig Start Date End Date Taking? Authorizing Provider  aspirin 81 MG tablet Take 81 mg by mouth daily.   Yes Historical Provider, MD   cholecalciferol (VITAMIN D) 1000 UNITS tablet Take 1,000 Units by mouth daily.   Yes Historical Provider, MD  Multiple Vitamin (MULTIVITAMIN WITH MINERALS) TABS tablet Take 1 tablet by mouth daily.   Yes Historical Provider, MD  nitroGLYCERIN (NITROSTAT) 0.4 MG SL tablet Place 1 tablet (0.4 mg total) under the tongue every 5 (five) minutes as needed for chest pain. 10/17/13  Yes Lyn RecordsHenry W Smith III, MD  vitamin C (ASCORBIC ACID) 500 MG tablet Take 500 mg by mouth daily.   Yes Historical Provider, MD   BP 144/72  Pulse 62  Temp(Src) 98.3 F (36.8 C) (Oral)  Resp 13  SpO2 97% Physical Exam  Nursing note and vitals reviewed. Constitutional: He is oriented to person, place, and time. He appears well-developed and well-nourished. No distress.  HENT:  Head: Normocephalic and atraumatic.  Mouth/Throat: Oropharynx is clear and moist.  Eyes: EOM are normal. Pupils are equal, round, and reactive to light.  Neck: Normal range of motion. Neck supple.  Cardiovascular: Normal rate and regular rhythm.   Pulmonary/Chest: Effort normal and breath sounds normal. No respiratory distress. He has no wheezes. He has no rales. He exhibits tenderness (chest tenderness reproduced with palpation over the left chest. There is no crepitance or deformity.).  Abdominal: Soft. Bowel sounds are normal. He exhibits no distension and no mass. There is no tenderness. There is no rebound and no guarding.  Musculoskeletal: Normal range of motion. He exhibits tenderness (patient with mild tenderness in the bilateral rhomboid region of the thoracic back. There is no midline tenderness.). He exhibits no edema.  No calf swelling or tenderness.  Neurological: He is alert and oriented to person, place, and time.  Moves all extremities without deficit. Sensation is grossly intact.  Skin: Skin is warm and dry. No rash noted. No erythema.  Psychiatric: He has a normal mood and affect. His behavior is normal.    ED Course   Procedures (including critical care time) Labs Review Labs Reviewed  COMPREHENSIVE METABOLIC PANEL - Abnormal; Notable for the following:    Glucose, Bld 109 (*)    BUN 28 (*)    Albumin 3.4 (*)    Total Bilirubin 0.2 (*)    GFR calc non Af Amer 55 (*)    GFR calc Af Amer 63 (*)    All other components within normal limits  CBC WITH DIFFERENTIAL  TROPONIN I    Imaging Review Dg Chest Port 1 View  04/09/2014   CLINICAL DATA:  Sudden onset chest pain  EXAM: PORTABLE CHEST - 1 VIEW  COMPARISON:  04/26/2013  FINDINGS: Normal heart size and mediastinal contours. No acute infiltrate or edema. No effusion or pneumothorax. No acute osseous findings.  IMPRESSION: No active disease.   Electronically Signed   By: Tiburcio Pea M.D.   On: 04/09/2014 02:57     EKG Interpretation   Date/Time:  Tuesday April 09 2014 01:44:52 EDT Ventricular Rate:  65 PR Interval:  154 QRS Duration: 104 QT Interval:  401 QTC Calculation: 417 R Axis:   121 Text Interpretation:  Right and left arm electrode reversal,  interpretation assumes no reversal Sinus rhythm Probable lateral infarct,  age indeterminate Confirmed by Ranae Palms  MD, Jacynda Brunke ([id number]) on 04/09/2014  4:49:15 AM      MDM   Final diagnoses:  Atypical chest pain    EKG without any acute changes. Normal chest x-ray. Normal troponin. Patient states the pain is worse with movement and not similar to previous pain associated with coronary artery disease. It is reproduced with palpation. The patient's been adequately screened for life-threatening causes for his chest pain. Likely the pain is musculoskeletal in nature. Patient is anxious to be discharged home. His been given return precautions and advised to followup with his cardiologist and return immediately to the emergency department for worsening symptoms or for any concerns.    Loren Racer, MD 04/09/14 [phone]

## 2014-04-09 NOTE — Discharge Instructions (Signed)

## 2014-04-25 ENCOUNTER — Ambulatory Visit: Payer: Medicare Other | Admitting: Interventional Cardiology

## 2014-06-18 ENCOUNTER — Ambulatory Visit (INDEPENDENT_AMBULATORY_CARE_PROVIDER_SITE_OTHER): Payer: Medicare Other | Admitting: Interventional Cardiology

## 2014-06-18 ENCOUNTER — Encounter: Payer: Self-pay | Admitting: Interventional Cardiology

## 2014-06-18 VITALS — BP 150/68 | HR 57 | Ht 69.0 in | Wt 168.0 lb

## 2014-06-18 DIAGNOSIS — E785 Hyperlipidemia, unspecified: Secondary | ICD-10-CM | POA: Diagnosis not present

## 2014-06-18 DIAGNOSIS — I2510 Atherosclerotic heart disease of native coronary artery without angina pectoris: Secondary | ICD-10-CM

## 2014-06-18 MED ORDER — NITROGLYCERIN 0.4 MG SL SUBL: 0.4000 mg | SUBLINGUAL_TABLET | SUBLINGUAL | Status: DC | PRN

## 2014-06-18 NOTE — Progress Notes (Signed)
Patient ID: Philip Parsons, male   DOB: 02/27/1941, 73 y.o.   MRN: [id number]    [address], [address] [place], [place]  [address] Phone: [phone] Fax:  [phone]  Date:  06/18/2014   ID:  Philip Parsons, DOB 25-Mar-1941, MRN [id number]  PCP:  Thora Lance, MD   ASSESSMENT:  1. Coronary artery disease, clinically stable 2. Hyperlipidemia, on therapy  PLAN:  1. clinical followup in one year 2. Active lifestyle   SUBJECTIVE: Philip Parsons is a 73 y.o. male who is doing well and has no cardiopulmonary complaints.   Wt Readings from Last 3 Encounters:  06/18/14 168 lb (76.204 kg)  10/17/13 168 lb (76.204 kg)  05/10/13 160 lb (72.576 kg)     Past Medical History  Diagnosis Date  . No pertinent past medical history   . Coronary artery disease   . History of intravascular stent placement     Current Outpatient Prescriptions  Medication Sig Dispense Refill  . aspirin 81 MG tablet Take 81 mg by mouth daily.      . cholecalciferol (VITAMIN D) 1000 UNITS tablet Take 1,000 Units by mouth daily.      . Multiple Vitamin (MULTIVITAMIN WITH MINERALS) TABS tablet Take 1 tablet by mouth daily.      . nitroGLYCERIN (NITROSTAT) 0.4 MG SL tablet Place 1 tablet (0.4 mg total) under the tongue every 5 (five) minutes as needed for chest pain.  25 tablet  3  . vitamin C (ASCORBIC ACID) 500 MG tablet Take 500 mg by mouth daily.       No current facility-administered medications for this visit.    Allergies:   No Known Allergies  Social History:  The patient  reports that he has quit smoking. He does not have any smokeless tobacco history on file. He reports that he does not drink alcohol or use illicit drugs.   ROS:  Please see the history of present illness.   No neurological complaints, edema, orthopnea, PND, syncope, or other complaints. Denies claudication.   All other systems reviewed and negative.   OBJECTIVE: VS:  BP 150/68  Pulse 57  Ht 5\' 9"  (1.753 m)  Wt 168 lb  (76.204 kg)  BMI 24.80 kg/m2 Well nourished, well developed, in no acute distress, slender and healthy-appearing HEENT: normal Neck: JVD flat. Carotid bruit absent  Cardiac:  normal S1, S2; RRR; no murmur Lungs:  clear to auscultation bilaterally, no wheezing, rhonchi or rales Abd: soft, nontender, no hepatomegaly Ext: Edema absent. Pulses 2+ Skin: warm and dry Neuro:  CNs 2-12 intact, no focal abnormalities noted  EKG:  Lateral Q waves with ectopic atrial rhythm. Otherwise normal. No change from prior.       Signed, Darci Needle III, MD 06/18/2014 12:01 PM

## 2014-06-18 NOTE — Patient Instructions (Signed)
Your physician recommends that you continue on your current medications as directed. Please refer to the Current Medication list given to you today.  Continue to stay active  Your physician wants you to follow-up in: 1 year with Dr.Smith You will receive a reminder letter in the mail two months in advance. If you don't receive a letter, please call our office to schedule the follow-up appointment.

## 2014-07-29 DIAGNOSIS — N401 Enlarged prostate with lower urinary tract symptoms: Secondary | ICD-10-CM | POA: Diagnosis not present

## 2014-08-05 DIAGNOSIS — N401 Enlarged prostate with lower urinary tract symptoms: Secondary | ICD-10-CM | POA: Diagnosis not present

## 2014-08-05 DIAGNOSIS — E559 Vitamin D deficiency, unspecified: Secondary | ICD-10-CM | POA: Diagnosis not present

## 2014-08-05 DIAGNOSIS — R351 Nocturia: Secondary | ICD-10-CM | POA: Diagnosis not present

## 2014-08-20 ENCOUNTER — Telehealth: Payer: Self-pay | Admitting: *Deleted

## 2014-08-20 NOTE — Telephone Encounter (Signed)
Follow up  Pt return call back to lisa; message routed to triage;

## 2014-08-20 NOTE — Telephone Encounter (Signed)
Returned pt call Pt sts that the following supplements have been recommended by his trainer And he would like to know if Dr.Smith think they are ok for him to take with his heart condition Pro-line Ly-sine L-arginine L-citrulione Pycnogenol Adv him I will fwd Dr.Smith a message and call back with his recommendation. He verbalized understanding

## 2014-08-20 NOTE — Telephone Encounter (Signed)
L arginine yes. The rest , I have no recommendation on or experience with.

## 2014-08-20 NOTE — Telephone Encounter (Signed)
Rerouting to [employer]

## 2014-08-20 NOTE — Telephone Encounter (Signed)
Returned pt call. Pt sts that he did not call back a second time. We will wait for Dr.Smith's response for previous question

## 2014-08-20 NOTE — Telephone Encounter (Signed)
New Message   Pt has personal trainer that recommends a drink; pt wants to make sure it doesn't conflict with current meds; please call back to discuss

## 2014-08-26 NOTE — Telephone Encounter (Signed)
Pt aware of Dr.Smith's response. It would be ok to take L arginine yes. The rest , I have no recommendation on or experience with.pt verbalized understanding

## 2014-09-05 ENCOUNTER — Encounter (HOSPITAL_COMMUNITY): Payer: Self-pay | Admitting: Cardiology

## 2014-09-30 ENCOUNTER — Telehealth: Payer: Self-pay | Admitting: Interventional Cardiology

## 2014-09-30 DIAGNOSIS — H1032 Unspecified acute conjunctivitis, left eye: Secondary | ICD-10-CM | POA: Diagnosis not present

## 2014-09-30 NOTE — Telephone Encounter (Signed)
Returned pt call, adv him that it is ok to to use plain Mucinex, he should avoid (D) decongestants. Let him no that Coricidin is also a good option for cardiac pt with a cold

## 2014-09-30 NOTE — Telephone Encounter (Signed)
New message    Pt C/O medication issue:  1. Name of Medication: over the counter medication   2. How are you currently taking this medication (dosage and times per day)? No   3. Are you having a reaction (difficulty breathing--STAT)? No   4. What is your medication issue? Wants to know can he take mucin ex DM   Has a cold wants to know what medication he can take.

## 2014-10-21 DIAGNOSIS — H9209 Otalgia, unspecified ear: Secondary | ICD-10-CM | POA: Diagnosis not present

## 2014-10-21 DIAGNOSIS — J029 Acute pharyngitis, unspecified: Secondary | ICD-10-CM | POA: Diagnosis not present

## 2014-12-26 ENCOUNTER — Other Ambulatory Visit: Payer: Self-pay

## 2014-12-26 ENCOUNTER — Other Ambulatory Visit (INDEPENDENT_AMBULATORY_CARE_PROVIDER_SITE_OTHER): Payer: Medicare Other | Admitting: *Deleted

## 2014-12-26 DIAGNOSIS — E785 Hyperlipidemia, unspecified: Secondary | ICD-10-CM | POA: Diagnosis not present

## 2014-12-26 LAB — LIPID PANEL
CHOL/HDL RATIO: 4
Cholesterol: 216 mg/dL — ABNORMAL HIGH (ref 0–200)
HDL: 53.4 mg/dL (ref >39.00)
LDL CALC: 142 mg/dL — AB (ref 0–99)
NONHDL: 162.6
TRIGLYCERIDES: 105 mg/dL (ref 0.0–149.0)
VLDL: 21 mg/dL (ref 0.0–40.0)

## 2014-12-26 LAB — ALT: ALT: 14 U/L (ref 0–53)

## 2014-12-30 ENCOUNTER — Telehealth: Payer: Self-pay | Admitting: Interventional Cardiology

## 2014-12-30 NOTE — Telephone Encounter (Signed)
Will forward to Nila Nephew CMA with Dr Katrinka Blazing

## 2014-12-30 NOTE — Telephone Encounter (Signed)
New problem   Pt want to know results of his labs he had done last week/

## 2015-01-01 NOTE — Telephone Encounter (Signed)
Advised patient of lab results  Patient stopped his Lipitor secondary to labs being good, states was told ok to d/c Patient has not been watching his diet (had bacon for breakfast)  Patient would like to try diet if Dr Katrinka Blazing is ok with that.  Will forward to Dr Katrinka Blazing for review

## 2015-01-01 NOTE — Telephone Encounter (Signed)
-----   Message from Lyn Records, MD sent at 12/27/2014  6:22 PM EDT ----- Not at target. He is not on therapy. Ask him why and document if he is intolerant. Levels are significantly elevated. Needs a statin, perhaps Crestor 10 mg daily with f/u statin panel in 6 weeks.

## 2015-01-03 NOTE — Telephone Encounter (Signed)
called to give pt Dr.Smith's recommendation.lmtcb 

## 2015-01-03 NOTE — Telephone Encounter (Signed)
He cannot do this with diet alone. If he prefers not to be on the medication, we will simply document and not discuss it any further.

## 2015-01-03 NOTE — Telephone Encounter (Signed)
Pt called back. Pt aware of Dr.Smith's recommendation. This is not acceptable. We need to get on therapy and stay. He has a stent and we will have additional trouble if we are not careful. Start the Crestor. F/u labs in 2 months.  Pt sts that he wants to hold off on starting Crestor. He is convinced that his diet and lifestyle has a lot to do with his elevated cholesterol numbers. He would like to make lifestyle changes, repeat fasting labs in 1 month, if numbers are still elevated he would be ok with starting Crestor at that time.adv he that I will fwd an update to Dr.Smith and call back with his response. Pt verbalized understanding.

## 2015-01-03 NOTE — Telephone Encounter (Signed)
-----   Message from Henry W Smith, MD sent at 12/27/2014  6:22 PM EDT ----- Not at target. He is not on therapy. Ask him why and document if he is intolerant. Levels are significantly elevated. Needs a statin, perhaps Crestor 10 mg daily with f/u statin panel in 6 weeks.  

## 2015-01-06 NOTE — Telephone Encounter (Signed)
Pt aware of Dr.Smith's recommendation. He cannot do this with diet alone. If he prefers not to be on the medication, we will simply document and not discuss it any further. Pt sts that he does not want to restart Crestor at this time. He is adimate that he wants to go the "organic route" and repeat his labs in 1 month. Pt sts that he can have his labs drawn else where and provide the results to Dr.Smith.

## 2015-01-12 ENCOUNTER — Emergency Department (HOSPITAL_COMMUNITY): Payer: Medicare Other

## 2015-01-12 ENCOUNTER — Emergency Department (HOSPITAL_COMMUNITY)
Admission: EM | Admit: 2015-01-12 | Discharge: 2015-01-12 | Disposition: A | Discharge status: Home / Self Care | Payer: Medicare Other | Attending: Emergency Medicine | Admitting: Emergency Medicine

## 2015-01-12 ENCOUNTER — Encounter (HOSPITAL_COMMUNITY): Payer: Self-pay | Admitting: Emergency Medicine

## 2015-01-12 DIAGNOSIS — R079 Chest pain, unspecified: Secondary | ICD-10-CM | POA: Diagnosis not present

## 2015-01-12 DIAGNOSIS — I2510 Atherosclerotic heart disease of native coronary artery without angina pectoris: Secondary | ICD-10-CM | POA: Insufficient documentation

## 2015-01-12 DIAGNOSIS — R109 Unspecified abdominal pain: Secondary | ICD-10-CM | POA: Diagnosis present

## 2015-01-12 DIAGNOSIS — Z79899 Other long term (current) drug therapy: Secondary | ICD-10-CM | POA: Diagnosis not present

## 2015-01-12 DIAGNOSIS — Z87891 Personal history of nicotine dependence: Secondary | ICD-10-CM | POA: Insufficient documentation

## 2015-01-12 DIAGNOSIS — Z9889 Other specified postprocedural states: Secondary | ICD-10-CM | POA: Diagnosis not present

## 2015-01-12 DIAGNOSIS — Z7982 Long term (current) use of aspirin: Secondary | ICD-10-CM | POA: Insufficient documentation

## 2015-01-12 DIAGNOSIS — R0789 Other chest pain: Secondary | ICD-10-CM | POA: Diagnosis not present

## 2015-01-12 LAB — CBC WITH DIFFERENTIAL/PLATELET
BASOS PCT: 0 % (ref 0–1)
Basophils Absolute: 0 10*3/uL (ref 0.0–0.1)
EOS ABS: 0.2 10*3/uL (ref 0.0–0.7)
Eosinophils Relative: 3 % (ref 0–5)
HEMATOCRIT: 42.2 % (ref 39.0–52.0)
Hemoglobin: 14 g/dL (ref 13.0–17.0)
LYMPHS PCT: 32 % (ref 12–46)
Lymphs Abs: 2.3 10*3/uL (ref 0.7–4.0)
MCH: 29.6 pg (ref 26.0–34.0)
MCHC: 33.2 g/dL (ref 30.0–36.0)
MCV: 89.2 fL (ref 78.0–100.0)
MONOS PCT: 14 % — AB (ref 3–12)
Monocytes Absolute: 1 10*3/uL (ref 0.1–1.0)
NEUTROS ABS: 3.6 10*3/uL (ref 1.7–7.7)
NEUTROS PCT: 51 % (ref 43–77)
PLATELETS: 330 10*3/uL (ref 150–400)
RBC: 4.73 MIL/uL (ref 4.22–5.81)
RDW: 12.6 % (ref 11.5–15.5)
WBC: 7.1 10*3/uL (ref 4.0–10.5)

## 2015-01-12 LAB — COMPREHENSIVE METABOLIC PANEL
ALK PHOS: 58 U/L (ref 39–117)
ALT: 16 U/L (ref 0–53)
ANION GAP: 10 (ref 5–15)
AST: 21 U/L (ref 0–37)
Albumin: 3.5 g/dL (ref 3.5–5.2)
BUN: 22 mg/dL (ref 6–23)
CALCIUM: 9 mg/dL (ref 8.4–10.5)
CHLORIDE: 100 mmol/L (ref 96–112)
CO2: 27 mmol/L (ref 19–32)
Creatinine, Ser: 1.52 mg/dL — ABNORMAL HIGH (ref 0.50–1.35)
GFR calc Af Amer: 50 mL/min — ABNORMAL LOW (ref >90)
GFR calc non Af Amer: 43 mL/min — ABNORMAL LOW (ref >90)
GLUCOSE: 81 mg/dL (ref 70–99)
Potassium: 3.6 mmol/L (ref 3.5–5.1)
SODIUM: 137 mmol/L (ref 135–145)
TOTAL PROTEIN: 7.5 g/dL (ref 6.0–8.3)
Total Bilirubin: 0.7 mg/dL (ref 0.3–1.2)

## 2015-01-12 LAB — I-STAT TROPONIN, ED
Troponin i, poc: 0 ng/mL (ref 0.00–0.08)
Troponin i, poc: 0 ng/mL (ref 0.00–0.08)

## 2015-01-12 LAB — LIPASE, BLOOD: LIPASE: 41 U/L (ref 11–59)

## 2015-01-12 NOTE — ED Provider Notes (Signed)
CSN: [id number]     Arrival date & time 01/12/15  1558 History   First MD Initiated Contact with Patient 01/12/15 1740     Chief Complaint  Patient presents with  . Abdominal Pain     (Consider location/radiation/quality/duration/timing/severity/associated sxs/prior Treatment) HPI  Pt presenting with lower mid sternal chest pain. He states the discomfort has come and lasted several seconds approx 3 times today.  First time he was lying in bed.  Other 2 times he was sitting down.  Not associated with exertion.  No shortness of breath, no nausea, no diaphoresis or radiation of pain.  Pt denies any epigastric pain despite triage note stating epigastric pain.  Pt states this feels different from when he had MI and stent placed 3 years ago.  He states he did not want to come in initially but did want to have it checked out.  He states he was cutting shrubs in his yard yesterday and thinks this is the cause of the pain today.  No leg swelling.  He has not had any treatment for this prior to arrival. There are no other associated systemic symptoms, there are no other alleviating or modifying factors.   Past Medical History  Diagnosis Date  . No pertinent past medical history   . Coronary artery disease   . History of intravascular stent placement    Past Surgical History  Procedure Laterality Date  . No past surgeries    . Left heart cath N/A 02/02/2012    Procedure: LEFT HEART CATH;  Surgeon: Robynn Pane, MD;  Location: [facility] CATH LAB;  Service: Cardiovascular;  Laterality: N/A;  . Left heart catheterization with coronary angiogram N/A 05/10/2013    Procedure: LEFT HEART CATHETERIZATION WITH CORONARY ANGIOGRAM;  Surgeon: Lesleigh Noe, MD;  Location: [facility] CATH LAB;  Service: Cardiovascular;  Laterality: N/A;   No family history on file. History  Substance Use Topics  . Smoking status: Former Games developer  . Smokeless tobacco: Not on file  . Alcohol Use: No    Review of Systems  ROS reviewed  and all otherwise negative except for mentioned in HPI    Allergies  Review of patient's allergies indicates no known allergies.  Home Medications   Prior to Admission medications   Medication Sig Start Date End Date Taking? Authorizing Provider  aspirin 81 MG tablet Take 81 mg by mouth daily.    Historical Provider, MD  cholecalciferol (VITAMIN D) 1000 UNITS tablet Take 1,000 Units by mouth daily.    Historical Provider, MD  Multiple Vitamin (MULTIVITAMIN WITH MINERALS) TABS tablet Take 1 tablet by mouth daily.    Historical Provider, MD  nitroGLYCERIN (NITROSTAT) 0.4 MG SL tablet Place 1 tablet (0.4 mg total) under the tongue every 5 (five) minutes as needed for chest pain. 06/18/14   Lyn Records, MD  vitamin C (ASCORBIC ACID) 500 MG tablet Take 500 mg by mouth daily.    Historical Provider, MD   BP 152/62 mmHg  Pulse 67  Temp(Src) 97.6 F (36.4 C) (Oral)  Resp 18  SpO2 99%  Vitals reviewed Physical Exam  Physical Examination: General appearance - alert, well appearing, and in no distress Mental status - alert, oriented to person, place, and time Eyes - no conjunctival injection, no scleral icterus Mouth - mucous membranes moist, pharynx normal without lesions Chest - clear to auscultation, no wheezes, rales or rhonchi, symmetric air entry, no tenderness to palpation Heart - normal rate, regular rhythm, normal S1,  S2, no murmurs, rubs, clicks or gallops Abdomen - soft, nontender, nondistended, no masses or organomegaly Extremities - peripheral pulses normal, no pedal edema, no clubbing or cyanosis Skin - normal coloration and turgor, no rashes  ED Course  Procedures (including critical care time)  7:40 PM d/w Dr. Hiram Comber, cardiology fellow- he agrees that this is low risk to be ACS or other acute issue at this time.  If second troponin is negative pt will be able to be discharged this evening and f/u with Dr. Katrinka Blazing on an outpatient basis.  Labs Review Labs Reviewed  CBC  WITH DIFFERENTIAL/PLATELET - Abnormal; Notable for the following:    Monocytes Relative 14 (*)    All other components within normal limits  COMPREHENSIVE METABOLIC PANEL - Abnormal; Notable for the following:    Creatinine, Ser 1.52 (*)    GFR calc non Af Amer 43 (*)    GFR calc Af Amer 50 (*)    All other components within normal limits  LIPASE, BLOOD  I-STAT TROPOININ, ED  Rosezena Sensor, ED    Imaging Review Dg Chest 2 View  01/12/2015   CLINICAL DATA:  Patient with intermittent mid chest pain since this morning.  EXAM: CHEST  2 VIEW  COMPARISON:  Chest radiograph 04/09/2014  FINDINGS: Stable cardiac and mediastinal contours. No consolidative pulmonary opacities. No pleural effusion or pneumothorax. Right glenohumeral joint degenerative changes.  IMPRESSION: No acute cardiopulmonary process.   Electronically Signed   By: Annia Belt M.D.   On: 01/12/2015 18:58     EKG Interpretation   Date/Time:  Sunday January 12 2015 16:03:48 EDT Ventricular Rate:  69 PR Interval:  138 QRS Duration: 104 QT Interval:  390 QTC Calculation: 417 R Axis:   57 Text Interpretation:  Normal sinus rhythm Normal ECG No significant change  since last tracing Confirmed by [facility]  MD, MARTHA [phone]) on 01/12/2015  6:14:53 PM      MDM   Final diagnoses:  Chest pain, unspecified chest pain type    Pt presenting with chest pain- seconds worth and occurred 3 times today.  No associated symptoms, not exertional in nature.  Pt does have hx of cardiac stent- pt of Dr. Katrinka Blazing.  EKg normal, 2 sets of troponins negative.  D/w cardiology - who recommends outpatient followup, does not feel further workup in necessary in the ED or hospital. Pt is anxious to be discharged so he is happy with this plan.  Discharged with strict return precautions.  Pt agreeable with plan.    Jerelyn Scott, MD 01/14/15 [phone]

## 2015-01-12 NOTE — Discharge Instructions (Signed)
Return to the ED with any concerns including difficulty breathing, worsening pain, sweating with pain, nausea, leg swelling, fainting, decreased level of alertness/lethargy, or any other alarming symptoms

## 2015-01-12 NOTE — ED Notes (Addendum)
C/o intermittent epigastric pain/pressure since 8am today.  Denies sob, nausea, and vomiting.  Denies pain at present.  States he cut bushes yesterday and may have pulled something.

## 2015-01-12 NOTE — ED Notes (Signed)
Patient transported to X-ray 

## 2015-01-16 ENCOUNTER — Telehealth: Payer: Self-pay | Admitting: Interventional Cardiology

## 2015-01-16 NOTE — Telephone Encounter (Signed)
Returned pt call. Pt sts that he is doing well since his visit to the ED on 4/16 He has had no reoccurrence of chest discomfort. He had been doing a lot of lifting and moving things around that day and feels he over did it. Pt adv to f/u as planned in 3-4 months. He should call if cardiac symptoms develop.  Pt sts that he daughter is a nutritionist and recommended he take the following supplements. CoQ10, NAC, PQQ  Pt would like Dr.Smith's opinion on whether he should take them or not. Adv him I will fwd him a message and call back

## 2015-01-16 NOTE — Telephone Encounter (Signed)
New Message  Pt went to the hospital. Says that he was advised he would receive a call from Dr. Michaelle Copas nurse. Would also like to discuss medications. No further details from patient that he would like to disclose. Please call

## 2015-01-17 NOTE — Telephone Encounter (Signed)
This is okay.

## 2015-01-21 NOTE — Telephone Encounter (Signed)
Pt aware of Dr.Smith's response 

## 2015-02-04 DIAGNOSIS — S63502A Unspecified sprain of left wrist, initial encounter: Secondary | ICD-10-CM | POA: Diagnosis not present

## 2015-02-13 ENCOUNTER — Telehealth: Payer: Self-pay | Admitting: Interventional Cardiology

## 2015-02-13 NOTE — Telephone Encounter (Signed)
Returned pt call. Pt rqst a copy of his most recent lipid lab, and my chart activation code. Both mailed to pt address listed in pt chart

## 2015-02-13 NOTE — Telephone Encounter (Signed)
New problem ° ° °Pt need results of labs. Please call pt. °

## 2015-03-04 ENCOUNTER — Telehealth: Payer: Self-pay | Admitting: Interventional Cardiology

## 2015-03-04 NOTE — Telephone Encounter (Signed)
Having some problems and wants to see dr Katrinka Blazing asap-he is booked until next Friday and pt says that's not soon enough-pls advise

## 2015-03-04 NOTE — Telephone Encounter (Signed)
Pt would like to be seen by Dr. Katrinka Blazing ASAP. Pt was made aware that Dr. Katrinka Blazing MD is booked up till next Friday pt would like to be seen sooner. Pt did not want to go in detail what is going on with him. Pt states have some  Problems , some pain , he wants for Dr. Katrinka Blazing to see him and see what he recommends.

## 2015-03-05 NOTE — Telephone Encounter (Signed)
See if he can be put on extender schedule and be seen on a day I am in the office(if that can get him in earlier).

## 2015-03-06 DIAGNOSIS — M25571 Pain in right ankle and joints of right foot: Secondary | ICD-10-CM | POA: Diagnosis not present

## 2015-03-06 NOTE — Telephone Encounter (Signed)
Called and spoke with pt. Pt sts that he has been having muscle and joint pain (i.e. In his wrist, ankles). Pt in not currently on statin therapy. Pt denies any cardiac symptoms. Pt denies chest pain, sob, swelling,palpitations. Pt has an upcoming appt with his pcp next wk. Pt will have pcp address his symptoms. Adv pt to f/u with Dr.Smith as planned in Sept. He should call the office if cardiac symptoms develop. Pt thanked me for the call and verbalized understanding.

## 2015-03-07 ENCOUNTER — Other Ambulatory Visit: Payer: Self-pay | Admitting: Family Medicine

## 2015-03-07 ENCOUNTER — Ambulatory Visit
Admission: RE | Admit: 2015-03-07 | Discharge: 2015-03-07 | Disposition: A | Discharge status: Home / Self Care | Payer: Medicare Other | Source: Ambulatory Visit | Attending: Family Medicine | Admitting: Family Medicine

## 2015-03-07 DIAGNOSIS — R609 Edema, unspecified: Secondary | ICD-10-CM

## 2015-03-07 DIAGNOSIS — M19071 Primary osteoarthritis, right ankle and foot: Secondary | ICD-10-CM | POA: Diagnosis not present

## 2015-03-07 DIAGNOSIS — M7989 Other specified soft tissue disorders: Secondary | ICD-10-CM | POA: Diagnosis not present

## 2015-03-28 DIAGNOSIS — M25532 Pain in left wrist: Secondary | ICD-10-CM | POA: Diagnosis not present

## 2015-04-14 ENCOUNTER — Telehealth: Payer: Self-pay | Admitting: Interventional Cardiology

## 2015-04-14 NOTE — Telephone Encounter (Signed)
New message      Pt is going to dentist tomorrow for a filling.  He has questions about taking amoxicilian prior to filling.

## 2015-04-14 NOTE — Telephone Encounter (Signed)
Pt called as he was provided with amoxicilian during his last dental visit because of his stenting.  Pt was educated that he should not need any amoxicilian this visit as he got is stent in 01/2012 and it is generally only used if getting dental work in the first 6 months of the stent being placed. Told pt that his dentist office can call us if they need to hear if from [place] or having additional questions. Pt expressed understanding and no additional questions at this time.

## 2015-06-13 DIAGNOSIS — B079 Viral wart, unspecified: Secondary | ICD-10-CM | POA: Diagnosis not present

## 2015-06-13 DIAGNOSIS — B078 Other viral warts: Secondary | ICD-10-CM | POA: Diagnosis not present

## 2015-06-27 ENCOUNTER — Encounter: Payer: Self-pay | Admitting: Interventional Cardiology

## 2015-06-27 ENCOUNTER — Ambulatory Visit (INDEPENDENT_AMBULATORY_CARE_PROVIDER_SITE_OTHER): Payer: Medicare Other | Admitting: Interventional Cardiology

## 2015-06-27 VITALS — BP 140/70 | HR 69 | Ht 69.0 in | Wt 169.4 lb

## 2015-06-27 DIAGNOSIS — I2109 ST elevation (STEMI) myocardial infarction involving other coronary artery of anterior wall: Secondary | ICD-10-CM | POA: Diagnosis not present

## 2015-06-27 DIAGNOSIS — E785 Hyperlipidemia, unspecified: Secondary | ICD-10-CM

## 2015-06-27 DIAGNOSIS — I2510 Atherosclerotic heart disease of native coronary artery without angina pectoris: Secondary | ICD-10-CM

## 2015-06-27 LAB — LIPID PANEL
CHOL/HDL RATIO: 4
Cholesterol: 209 mg/dL — ABNORMAL HIGH (ref 0–200)
HDL: 55.6 mg/dL (ref >39.00)
LDL Cholesterol: 136 mg/dL — ABNORMAL HIGH (ref 0–99)
NONHDL: 153.88
Triglycerides: 88 mg/dL (ref 0.0–149.0)
VLDL: 17.6 mg/dL (ref 0.0–40.0)

## 2015-06-27 NOTE — Progress Notes (Signed)
Cardiology Office Note   Date:  06/27/2015   ID:  [name], [name] 1941/09/27, MRN [id number]  PCP:  Thora Lance, MD  Cardiologist:  Lesleigh Noe, MD   Chief Complaint  Patient presents with  . Coronary Artery Disease      History of Present Illness: Philip Parsons is a 74 y.o. male who presents for CAD with LAD Promus DES 2013, hyperlipidemia,and family history of CAD.  He is asymptomatic. He is again off statin therapy. He has the impression that he can control his lipid values by diet, exercise, and nonpharmacologic measures. He wants his lipids retested today.  Past Medical History  Diagnosis Date  . No pertinent past medical history   . Coronary artery disease   . History of intravascular stent placement     Past Surgical History  Procedure Laterality Date  . No past surgeries    . Left heart cath N/A 02/02/2012    Procedure: LEFT HEART CATH;  Surgeon: Robynn Pane, MD;  Location: [facility] CATH LAB;  Service: Cardiovascular;  Laterality: N/A;  . Left heart catheterization with coronary angiogram N/A 05/10/2013    Procedure: LEFT HEART CATHETERIZATION WITH CORONARY ANGIOGRAM;  Surgeon: Lesleigh Noe, MD;  Location: [facility] CATH LAB;  Service: Cardiovascular;  Laterality: N/A;     Current Outpatient Prescriptions  Medication Sig Dispense Refill  . aspirin 81 MG tablet Take 81 mg by mouth daily.    . cholecalciferol (VITAMIN D) 1000 UNITS tablet Take 1,000 Units by mouth daily.    . Multiple Vitamin (MULTIVITAMIN WITH MINERALS) TABS tablet Take 1 tablet by mouth daily.    . nitroGLYCERIN (NITROSTAT) 0.4 MG SL tablet Place 1 tablet (0.4 mg total) under the tongue every 5 (five) minutes as needed for chest pain. 25 tablet 3  . vitamin C (ASCORBIC ACID) 500 MG tablet Take 500 mg by mouth daily.     No current facility-administered medications for this visit.    Allergies:   Review of patient's allergies indicates no known allergies.    Social History:  The  patient  reports that he has quit smoking. He has never used smokeless tobacco. He reports that he does not drink alcohol or use illicit drugs.   Family History:  The patient's family history includes Arthritis in his mother; Healthy in his father; Heart disease in his sister.    ROS:  Please see the history of present illness.   Otherwise, review of systems are positive for no complaints..   All other systems are reviewed and negative.    PHYSICAL EXAM: VS:  BP 140/70 mmHg  Pulse 69  Ht  (1.753 m)  Wt 76.839 kg (169 lb 6.4 oz)  BMI 25.00 kg/m2  SpO2 95% , BMI Body mass index is 25 kg/(m^2). GEN: Well nourished, well developed, in no acute distress HEENT: normal Neck: no JVD, carotid bruits, or masses Cardiac: RRR.  There is no murmur, rub, or gallop. There is no edema. Respiratory:  clear to auscultation bilaterally, normal work of breathing. GI: soft, nontender, nondistended, + BS MS: no deformity or atrophy Skin: warm and dry, no rash Neuro:  Strength and sensation are intact Psych: euthymic mood, full affect   EKG:  EKG is not ordered today.    Recent Labs: 01/12/2015: ALT 16; BUN 22; Creatinine, Ser 1.52*; Hemoglobin 14.0; Platelets 330; Potassium 3.6; Sodium 137    Lipid Panel    Component Value Date/Time   CHOL  216* 12/26/2014 0944   TRIG 105.0 12/26/2014 0944   HDL 53.40 12/26/2014 0944   CHOLHDL 4 12/26/2014 0944   VLDL 21.0 12/26/2014 0944   LDLCALC 142* 12/26/2014 0944   LDLDIRECT 152.5 10/15/2013 1131      Wt Readings from Last 3 Encounters:  06/27/15 76.839 kg (169 lb 6.4 oz)  06/18/14 76.204 kg (168 lb)  10/17/13 76.204 kg (168 lb)      Other studies Reviewed: Additional studies/ records that were reviewed today include: reviewed all lipids and we have captured within health Link. His typical total cholesterol of therapy is in the 220 range with an LDL  Between 120 and 150. .    ASSESSMENT AND PLAN:  1. Coronary artery disease involving  native coronary artery of native heart without angina pectoris Stable without angina  2. Hyperlipemia Likely poorly controlled since statin therapy has again been discontinued  3. Myocardial infarction, anterolateral wall, acute No evidence of volume overload or systolic dysfunction    Current medicines are reviewed at length with the patient today.  The patient has the following concerns regarding medicines: Statin therapy and patients desire to avoid..  The following changes/actions have been instituted:    Fasting lipid panel  Long discussion concerning the importance of adequate lipid management  Labs/ tests ordered today include:  No orders of the defined types were placed in this encounter.   Greater than 30 minutes was spent with the patient and more than 50% was effaced to face discussion concerning therapy.  Disposition:   FU with HS in 1 year  Signed, Lesleigh Noe, MD  06/27/2015 10:07 AM    [facility] [address] [place], [place], [place]  [address] Phone: [phone]; Fax: [phone]

## 2015-06-27 NOTE — Patient Instructions (Signed)
Medication Instructions:  Your physician recommends that you continue on your current medications as directed. Please refer to the Current Medication list given to you today.   Labwork: Lipid Today  Testing/Procedures: None ordered  Follow-Up: Your physician wants you to follow-up in: 1 year with Dr.Smith You will receive a reminder letter in the mail two months in advance. If you don't receive a letter, please call our office to schedule the follow-up appointment.   Any Other Special Instructions Will Be Listed Below (If Applicable).

## 2015-06-30 ENCOUNTER — Telehealth: Payer: Self-pay

## 2015-06-30 NOTE — Telephone Encounter (Signed)
-----   Message from Lyn Records, MD sent at 06/27/2015  6:20 PM EDT ----- But him know of the laboratory data is as we discussed. Total cholesterol 209 back cholesterol 136. Targets are 170 and 70 respectively. We shouldn't start therapy again unless he is committed to stay on therapy. He can take time to consider.

## 2015-06-30 NOTE — Telephone Encounter (Signed)
pt

## 2015-08-29 DIAGNOSIS — H348122 Central retinal vein occlusion, left eye, stable: Secondary | ICD-10-CM | POA: Diagnosis not present

## 2015-08-29 DIAGNOSIS — H25043 Posterior subcapsular polar age-related cataract, bilateral: Secondary | ICD-10-CM | POA: Diagnosis not present

## 2015-09-01 DIAGNOSIS — H348122 Central retinal vein occlusion, left eye, stable: Secondary | ICD-10-CM | POA: Diagnosis not present

## 2015-09-08 DIAGNOSIS — H35352 Cystoid macular degeneration, left eye: Secondary | ICD-10-CM | POA: Diagnosis not present

## 2015-09-08 DIAGNOSIS — H34812 Central retinal vein occlusion, left eye, with macular edema: Secondary | ICD-10-CM | POA: Diagnosis not present

## 2015-09-08 DIAGNOSIS — H401131 Primary open-angle glaucoma, bilateral, mild stage: Secondary | ICD-10-CM | POA: Diagnosis not present

## 2015-09-08 DIAGNOSIS — H3562 Retinal hemorrhage, left eye: Secondary | ICD-10-CM | POA: Diagnosis not present

## 2015-10-08 DIAGNOSIS — H2511 Age-related nuclear cataract, right eye: Secondary | ICD-10-CM | POA: Diagnosis not present

## 2015-10-08 DIAGNOSIS — H3562 Retinal hemorrhage, left eye: Secondary | ICD-10-CM | POA: Diagnosis not present

## 2015-10-08 DIAGNOSIS — H35352 Cystoid macular degeneration, left eye: Secondary | ICD-10-CM | POA: Diagnosis not present

## 2015-10-08 DIAGNOSIS — H34812 Central retinal vein occlusion, left eye, with macular edema: Secondary | ICD-10-CM | POA: Diagnosis not present

## 2015-11-14 DIAGNOSIS — R030 Elevated blood-pressure reading, without diagnosis of hypertension: Secondary | ICD-10-CM | POA: Diagnosis not present

## 2015-11-14 DIAGNOSIS — I2510 Atherosclerotic heart disease of native coronary artery without angina pectoris: Secondary | ICD-10-CM | POA: Diagnosis not present

## 2015-11-14 DIAGNOSIS — Z23 Encounter for immunization: Secondary | ICD-10-CM | POA: Diagnosis not present

## 2015-11-14 DIAGNOSIS — Z1389 Encounter for screening for other disorder: Secondary | ICD-10-CM | POA: Diagnosis not present

## 2015-11-14 DIAGNOSIS — Z125 Encounter for screening for malignant neoplasm of prostate: Secondary | ICD-10-CM | POA: Diagnosis not present

## 2015-11-14 DIAGNOSIS — Z0000 Encounter for general adult medical examination without abnormal findings: Secondary | ICD-10-CM | POA: Diagnosis not present

## 2015-11-14 DIAGNOSIS — E7800 Pure hypercholesterolemia, unspecified: Secondary | ICD-10-CM | POA: Diagnosis not present

## 2015-11-18 ENCOUNTER — Telehealth: Payer: Self-pay | Admitting: Interventional Cardiology

## 2015-11-18 NOTE — Telephone Encounter (Signed)
New message      Pt had his cholesterol checked at his PCP's office.  He want to talk to the nurse about the results

## 2015-11-18 NOTE — Telephone Encounter (Signed)
Pt called again

## 2015-11-19 NOTE — Telephone Encounter (Signed)
Returned pt call. Pt sts that he recently had his fasting lipid drawn with his pcp. Pt sts that he was told that his lipid numbers have gone up, he wanted to know what his last cholesterol #'s were. Went over pt lipid results from Sept 2016. Pt sts that his pcp office will be fwd  His lab results for Dr.Smith to review. Pt sts that he is usually eats healthy, he will make sure he is following a low fat diet, and has recently started an exercise plan. Adv pt that once lab results are received and reviewed by Dr.Smith, we will call back if he has any additional recommendations. Pt agreeable and verbalized understanding.

## 2015-11-21 ENCOUNTER — Encounter: Payer: Self-pay | Admitting: Interventional Cardiology

## 2016-05-28 DIAGNOSIS — R350 Frequency of micturition: Secondary | ICD-10-CM | POA: Diagnosis not present

## 2016-05-28 DIAGNOSIS — R1030 Lower abdominal pain, unspecified: Secondary | ICD-10-CM | POA: Diagnosis not present

## 2016-06-09 ENCOUNTER — Encounter: Payer: Self-pay | Admitting: *Deleted

## 2016-06-23 ENCOUNTER — Ambulatory Visit (INDEPENDENT_AMBULATORY_CARE_PROVIDER_SITE_OTHER): Payer: Medicare Other | Admitting: Interventional Cardiology

## 2016-06-23 ENCOUNTER — Encounter: Payer: Self-pay | Admitting: Interventional Cardiology

## 2016-06-23 VITALS — BP 150/76 | HR 61 | Ht 69.0 in | Wt 168.0 lb

## 2016-06-23 DIAGNOSIS — E785 Hyperlipidemia, unspecified: Secondary | ICD-10-CM | POA: Diagnosis not present

## 2016-06-23 DIAGNOSIS — I2109 ST elevation (STEMI) myocardial infarction involving other coronary artery of anterior wall: Secondary | ICD-10-CM

## 2016-06-23 DIAGNOSIS — I2510 Atherosclerotic heart disease of native coronary artery without angina pectoris: Secondary | ICD-10-CM

## 2016-06-23 MED ORDER — NITROGLYCERIN 0.4 MG SL SUBL: 0.4000 mg | SUBLINGUAL_TABLET | SUBLINGUAL | 3 refills | Status: DC | PRN

## 2016-06-23 NOTE — Progress Notes (Signed)
Cardiology Office Note    Date:  06/23/2016   ID:  Philip Parsons, DOB 08-04-1941, MRN [id number]  PCP:  Thora LanceEHINGER,ROBERT R, MD  Cardiologist: Lesleigh NoeHenry W Doyal Saric III, MD   Chief Complaint  Patient presents with  . Coronary Artery Disease    History of Present Illness:  Philip Parsons is a 75 y.o. male who presents for CAD with LAD Promus DES 2013, hyperlipidemia,and family history of CAD.  He is asymptomatic. He is very active. He has not had syncope. No claudication symptoms. Denies neurological complaints. Glycerin is outdated.   Past Medical History:  Diagnosis Date  . Coronary artery disease   . History of intravascular stent placement   . No pertinent past medical history     Past Surgical History:  Procedure Laterality Date  . LEFT HEART CATH N/A 02/02/2012   Procedure: LEFT HEART CATH;  Surgeon: Robynn PaneMohan N Harwani, MD;  [facility];  Service: Cardiovascular;  Laterality: N/A;  . LEFT HEART CATHETERIZATION WITH CORONARY ANGIOGRAM N/A 05/10/2013   Procedure: LEFT HEART CATHETERIZATION WITH CORONARY ANGIOGRAM;  Surgeon: Lesleigh NoeHenry W Amiayah Giebel III, MD;  [facility];  Service: Cardiovascular;  Laterality: N/A;  . NO PAST SURGERIES      Current Medications: Outpatient Medications Prior to Visit  Medication Sig Dispense Refill  . aspirin 81 MG tablet Take 81 mg by mouth daily.    . cholecalciferol (VITAMIN D) 1000 UNITS tablet Take 1,000 Units by mouth daily.    . Multiple Vitamin (MULTIVITAMIN WITH MINERALS) TABS tablet Take 1 tablet by mouth daily.    . vitamin C (ASCORBIC ACID) 500 MG tablet Take 500 mg by mouth daily.    . nitroGLYCERIN (NITROSTAT) 0.4 MG SL tablet Place 1 tablet (0.4 mg total) under the tongue every 5 (five) minutes as needed for chest pain. 25 tablet 3   No facility-administered medications prior to visit.      Allergies:   Review of patient's allergies indicates no known allergies.   Social History   Social History  . Marital status: Married    Spouse name: N/A  . Number of children: N/A  . Years of education: N/A   Social History Main Topics  . Smoking status: Former Games developermoker  . Smokeless tobacco: Never Used  . Alcohol use No  . Drug use: No  . Sexual activity: Yes   Other Topics Concern  . None   Social History Narrative  . None     Family History:  The patient's family history includes Arthritis in his mother; Healthy in his father; Heart disease in his sister.   ROS:   Please see the history of present illness.    No complaints.  All other systems reviewed and are negative.   PHYSICAL EXAM:   VS:  BP (!) 150/76   Pulse 61   Ht 5\' 9"  (1.753 m)   Wt 168 lb (76.2 kg)   BMI 24.81 kg/m    GEN: Well nourished, well developed, in no acute distress  HEENT: normal  Neck: no JVD, carotid bruits, or masses Cardiac: RRR; no murmurs, rubs, or gallops,no edema  Respiratory:  clear to auscultation bilaterally, normal work of breathing GI: soft, nontender, nondistended, + BS MS: no deformity or atrophy  Skin: warm and dry, no rash Neuro:  Alert and Oriented x 3, Strength and sensation are intact Psych: euthymic mood, full affect  Wt Readings from Last 3 Encounters:  06/23/16 168 lb (76.2 kg)  06/27/15 169 lb 6.4 oz (76.8 kg)  06/18/14 168 lb (76.2 kg)      Studies/Labs Reviewed:   EKG:  EKG  Normal sinus rhythm with normal appearance. RSR prime V1.  Recent Labs: No results found for requested labs within last 8760 hours.   Lipid Panel    Component Value Date/Time   CHOL 209 (H) 06/27/2015 1031   TRIG 88.0 06/27/2015 1031   HDL 55.60 06/27/2015 1031   CHOLHDL 4 06/27/2015 1031   VLDL 17.6 06/27/2015 1031   LDLCALC 136 (H) 06/27/2015 1031   LDLDIRECT 152.5 10/15/2013 1131    Additional studies/ records that were reviewed today include:  No new data.    ASSESSMENT:    1. Coronary artery disease involving native coronary artery of native heart without angina pectoris   2. Hyperlipemia   3.  Myocardial infarction, anterolateral wall, acute (HCC)      PLAN:  In order of problems listed above:  1. Continue baby aspirin. Continue active lifestyle. He asked about myocardial perfusion imaging. I do not believe he needs at with his level of activity and absence of symptoms. He has no significant risk factors other than family history and lipids. He doesn't smoke and is not diabetic. He should notify us of any discomfort. 2. Not monitoring lipids since he refuses to take/she is therapy. Low fat diet is recommended.    Medication Adjustments/Labs and Tests Ordered: Current medicines are reviewed at length with the patient today.  Concerns regarding medicines are outlined above.  Medication changes, Labs and Tests ordered today are listed in the Patient Instructions below. There are no Patient Instructions on file for this visit.   Signed, Lesleigh Noe, MD  06/23/2016 10:40 AM    [facility] [address] [place], [place], [place]  [address] Phone: [phone]; Fax: [phone]

## 2016-06-23 NOTE — Patient Instructions (Signed)
Medication Instructions:  Your physician recommends that you continue on your current medications as directed. Please refer to the Current Medication list given to you today.  We have refilled your Nitro-glycerin  Labwork: None ordered  Testing/Procedures: None ordered  Follow-Up: Your physician wants you to follow-up in: 1 year with Dr.Smith You will receive a reminder letter in the mail two months in advance. If you don't receive a letter, please call our office to schedule the follow-up appointment.   Any Other Special Instructions Will Be Listed Below (If Applicable).     If you need a refill on your cardiac medications before your next appointment, please call your pharmacy.

## 2016-07-13 ENCOUNTER — Emergency Department (HOSPITAL_COMMUNITY): Payer: Medicare Other

## 2016-07-13 ENCOUNTER — Telehealth: Payer: Self-pay | Admitting: Interventional Cardiology

## 2016-07-13 ENCOUNTER — Emergency Department (HOSPITAL_COMMUNITY)
Admission: EM | Admit: 2016-07-13 | Discharge: 2016-07-13 | Disposition: A | Discharge status: Home / Self Care | Payer: Medicare Other | Attending: Emergency Medicine | Admitting: Emergency Medicine

## 2016-07-13 ENCOUNTER — Encounter (HOSPITAL_COMMUNITY): Payer: Self-pay | Admitting: *Deleted

## 2016-07-13 DIAGNOSIS — M25512 Pain in left shoulder: Secondary | ICD-10-CM | POA: Diagnosis not present

## 2016-07-13 DIAGNOSIS — R079 Chest pain, unspecified: Secondary | ICD-10-CM | POA: Diagnosis not present

## 2016-07-13 DIAGNOSIS — Z5321 Procedure and treatment not carried out due to patient leaving prior to being seen by health care provider: Secondary | ICD-10-CM | POA: Insufficient documentation

## 2016-07-13 LAB — I-STAT TROPONIN, ED: TROPONIN I, POC: 0 ng/mL (ref 0.00–0.08)

## 2016-07-13 LAB — CBC
HCT: 43 % (ref 39.0–52.0)
Hemoglobin: 14.4 g/dL (ref 13.0–17.0)
MCH: 30.2 pg (ref 26.0–34.0)
MCHC: 33.5 g/dL (ref 30.0–36.0)
MCV: 90.1 fL (ref 78.0–100.0)
PLATELETS: 315 10*3/uL (ref 150–400)
RBC: 4.77 MIL/uL (ref 4.22–5.81)
RDW: 12.5 % (ref 11.5–15.5)
WBC: 7.8 10*3/uL (ref 4.0–10.5)

## 2016-07-13 LAB — BASIC METABOLIC PANEL
Anion gap: 11 (ref 5–15)
BUN: 20 mg/dL (ref 6–20)
CALCIUM: 9.1 mg/dL (ref 8.9–10.3)
CO2: 27 mmol/L (ref 22–32)
CREATININE: 1.27 mg/dL — AB (ref 0.61–1.24)
Chloride: 101 mmol/L (ref 101–111)
GFR calc Af Amer: >60 mL/min (ref >60)
GFR calc non Af Amer: 53 mL/min — ABNORMAL LOW (ref >60)
GLUCOSE: 98 mg/dL (ref 65–99)
POTASSIUM: 3.8 mmol/L (ref 3.5–5.1)
SODIUM: 139 mmol/L (ref 135–145)

## 2016-07-13 NOTE — Telephone Encounter (Signed)
Spoke with pt. He reports continuous pain across shoulder and back for last 3 days. Yesterday developed pain in 2 different spots on chest. He states this comes and goes. Happened 5-6 times since yesterday. Lasts about 10 minutes. Goes away on it's own. Has not taken NTG. The first time pain happened he used a type of pain medicine oil on area which helped. He has not used this again. He is able to do normal activities but feels more tired than normal. This pain is new since he saw Dr. Katrinka Blazing.  Pt reports it does not feel like pain he had when stent was placed. Pt has not had this pain before and is concerned it could be heart related. Will review with provider in office.

## 2016-07-13 NOTE — ED Triage Notes (Signed)
Pt with cardiac history with cardiac stent in 2014 is here for evaluation of pressure in left back and left shoulder x3 days.  He spoke with nurse at Dr. Lonn Georgia cardiology office and she advised pt to come to to ED to be evaluated.  No CP or sob wit this.  Pain does not increase with any movement and nothing seems to make it better.

## 2016-07-13 NOTE — ED Notes (Signed)
Pt came to nurse first to advise he is leaving.  Encouraged pt to stay, pt declined.  Pt said he will return tomorrow.

## 2016-07-13 NOTE — Telephone Encounter (Signed)
New message  Pt c/o of Chest Pain: STAT if CP now or developed within 24 hours  1. Are you having CP right now? Per pt its not really bad. But pt wants to get it checked out.   2. Are you experiencing any other symptoms (ex. SOB, nausea, vomiting, sweating)? No   3. How long have you been experiencing CP? 3 days   4. Is your CP continuous or coming and going? Coming and going   5. Have you taken Nitroglycerin? No  Pt would like an appt with Dr. Katrinka Blazing if possible.  ?

## 2016-07-13 NOTE — Telephone Encounter (Signed)
He is a relative complainer. ER is right move.

## 2016-07-13 NOTE — Telephone Encounter (Signed)
Reviewed with [name], NP and Philip Parsons needs to go to ED due to ongoing back pain. I spoke with Philip Parsons and gave him these instructions.

## 2016-07-14 ENCOUNTER — Telehealth: Payer: Self-pay | Admitting: Interventional Cardiology

## 2016-07-14 NOTE — Telephone Encounter (Signed)
Pt walked into clinic today and Michalene, RN and I went out to see pt. Pt seen in ED yesterday and he left at 10pm due to wait time and wife unable to drive after dark.  Pt was told by ER nurse to come back today.  Pt did and states he was told that he would have to go through the check in process again so pt came to office.  Pt denies any CP, SOB, dizziness or other cardiac symptoms.  Pt states that he can still feel the pain in his back and it worsens when he does a shrugging type motion with his shoulders.  Pt denies use of Nitro or Ibuprofen for pain. Pt has used [place] essential oil for pain with no relief. Pt was advised if CP returns to use Nitro to see if there is any relief.  Pt was also advised to use Ibuprofen or Tylenol to see if pain relieves.  Pt would like for Dr. Katrinka Blazing to review his ED visit and see if he feels any of this may be cardiac related.  If not, pt states he plans to see his PCP.  Advised pt we will get message to Dr.Smith for him to review.

## 2016-07-15 NOTE — Telephone Encounter (Signed)
ER data base is unremarkable. See PCP.

## 2016-07-16 NOTE — Telephone Encounter (Signed)
Spoke with pt and advised him of information provided by Dr. Katrinka Blazing.  Pt verbalized understanding and was in agreement to see PCP.

## 2016-11-16 DIAGNOSIS — E7800 Pure hypercholesterolemia, unspecified: Secondary | ICD-10-CM | POA: Diagnosis not present

## 2016-11-16 DIAGNOSIS — Z0000 Encounter for general adult medical examination without abnormal findings: Secondary | ICD-10-CM | POA: Diagnosis not present

## 2016-11-16 DIAGNOSIS — Z1389 Encounter for screening for other disorder: Secondary | ICD-10-CM | POA: Diagnosis not present

## 2016-11-16 DIAGNOSIS — I2510 Atherosclerotic heart disease of native coronary artery without angina pectoris: Secondary | ICD-10-CM | POA: Diagnosis not present

## 2017-03-02 DIAGNOSIS — H40013 Open angle with borderline findings, low risk, bilateral: Secondary | ICD-10-CM | POA: Diagnosis not present

## 2017-03-02 DIAGNOSIS — H2513 Age-related nuclear cataract, bilateral: Secondary | ICD-10-CM | POA: Diagnosis not present

## 2017-03-02 DIAGNOSIS — H5203 Hypermetropia, bilateral: Secondary | ICD-10-CM | POA: Diagnosis not present

## 2017-03-02 DIAGNOSIS — H524 Presbyopia: Secondary | ICD-10-CM | POA: Diagnosis not present

## 2017-03-02 DIAGNOSIS — H52221 Regular astigmatism, right eye: Secondary | ICD-10-CM | POA: Diagnosis not present

## 2017-06-27 ENCOUNTER — Ambulatory Visit (INDEPENDENT_AMBULATORY_CARE_PROVIDER_SITE_OTHER): Payer: Medicare Other | Admitting: Interventional Cardiology

## 2017-06-27 ENCOUNTER — Encounter: Payer: Self-pay | Admitting: Interventional Cardiology

## 2017-06-27 VITALS — BP 138/80 | HR 63 | Ht 69.0 in | Wt 166.4 lb

## 2017-06-27 DIAGNOSIS — I2109 ST elevation (STEMI) myocardial infarction involving other coronary artery of anterior wall: Secondary | ICD-10-CM | POA: Diagnosis not present

## 2017-06-27 DIAGNOSIS — I25118 Atherosclerotic heart disease of native coronary artery with other forms of angina pectoris: Secondary | ICD-10-CM

## 2017-06-27 DIAGNOSIS — E7849 Other hyperlipidemia: Secondary | ICD-10-CM | POA: Diagnosis not present

## 2017-06-27 NOTE — Progress Notes (Signed)
Cardiology Office Note    Date:  06/27/2017   ID:  Case, Vassell 06-29-1941, MRN [id number]  PCP:  Blair Heys, MD  Cardiologist: Lesleigh Noe, MD   Chief Complaint  Patient presents with  . Coronary Artery Disease    History of Present Illness:  Philip Parsons is a 76 y.o. male  who presents for CAD with LAD Promus DES 2013, hyperlipidemia,and family history of CAD.  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 He maintains excellent physical conditioning. He denies chest discomfort. He has never been compliant with my management strategies for lipid lowering. He has significant LDL elevation, frequently greater than 2 times normal/target. He has not needed to use nitroglycerin. Overall he feels well.     Past Medical History:  Diagnosis Date  . Coronary artery disease   . History of intravascular stent placement   . No pertinent past medical history     Past Surgical History:  Procedure Laterality Date  . LEFT HEART CATH N/A 02/02/2012   Procedure: LEFT HEART CATH;  Surgeon: Robynn Pane, MD;  Location: [facility] CATH LAB;  Service: Cardiovascular;  Laterality: N/A;  . LEFT HEART CATHETERIZATION WITH CORONARY ANGIOGRAM N/A 05/10/2013   Procedure: LEFT HEART CATHETERIZATION WITH CORONARY ANGIOGRAM;  Surgeon: Lesleigh Noe, MD;  Location: [facility] CATH LAB;  Service: Cardiovascular;  Laterality: N/A;  . NO PAST SURGERIES      Current Medications: Outpatient Medications Prior to Visit  Medication Sig Dispense Refill  . aspirin 81 MG tablet Take 81 mg by mouth daily.    . cholecalciferol (VITAMIN D) 1000 UNITS tablet Take 1,000 Units by mouth daily.    . nitroGLYCERIN (NITROSTAT) 0.4 MG SL tablet Place 1 tablet (0.4 mg total) under the tongue every 5 (five) minutes as needed for chest pain. 25 tablet 3  . vitamin C (ASCORBIC ACID) 500 MG tablet Take 500 mg by mouth daily.    . Multiple Vitamin (MULTIVITAMIN WITH MINERALS) TABS tablet Take 1  tablet by mouth daily.     No facility-administered medications prior to visit.      Allergies:   Patient has no known allergies.   Social History   Social History  . Marital status: Married    Spouse name: N/A  . Number of children: N/A  . Years of education: N/A   Social History Main Topics  . Smoking status: Former Games developer  . Smokeless tobacco: Never Used  . Alcohol use No  . Drug use: No  . Sexual activity: Yes   Other Topics Concern  . None   Social History Narrative  . None     Family History:  The patient's family history includes Arthritis in his mother; Healthy in his father; Heart disease in his sister.   ROS:   Please see the history of present illness.    Anxiety concerning his cardiovascular problem. Anti-medication therapy. He prefers Nitrol remedies. All other systems reviewed and are negative.   PHYSICAL EXAM:   VS:  BP 138/80 (BP Location: Left Arm)   Pulse 63   Ht  (1.753 m)   Wt 166 lb 6.4 oz (75.5 kg)   BMI 24.57 kg/m    GEN: Well nourished, well developed, in no acute distress  HEENT: normal  Neck: no JVD, carotid bruits, or masses Cardiac: RRR; no murmurs, rubs, or gallops,no edema  Respiratory:  clear to auscultation bilaterally, normal work of  breathing GI: soft, nontender, nondistended, + BS MS: no deformity or atrophy  Skin: warm and dry, no rash Neuro:  Alert and Oriented x 3, Strength and sensation are intact Psych: euthymic mood, full affect  Wt Readings from Last 3 Encounters:  06/27/17 166 lb 6.4 oz (75.5 kg)  07/13/16 168 lb (76.2 kg)  06/23/16 168 lb (76.2 kg)      Studies/Labs Reviewed:   EKG:  EKG  Normal sinus rhythm, incomplete right bundle branch block, overall unchanged from prior tracings.  Recent Labs: 07/13/2016: BUN 20; Creatinine, Ser 1.27; Hemoglobin 14.4; Platelets 315; Potassium 3.8; Sodium 139   Lipid Panel    Component Value Date/Time   CHOL 209 (H) 06/27/2015 1031   TRIG 88.0 06/27/2015 1031    HDL 55.60 06/27/2015 1031   CHOLHDL 4 06/27/2015 1031   VLDL 17.6 06/27/2015 1031   LDLCALC 136 (H) 06/27/2015 1031   LDLDIRECT 152.5 10/15/2013 1131    Additional studies/ records that were reviewed today include:  No new functional data.  ASSESSMENT:    1. Atherosclerosis of native coronary artery of native heart with stable angina pectoris (HCC)   2. Other hyperlipidemia   3. Myocardial infarction, anterolateral wall, acute (HCC)      PLAN:  In order of problems listed above:  1. Stable without angina. 2. LDL most recently in febrile or 2018 was 141. We discussed that his target should be less than 70. We'll check a lipid panel today. Multiple recommendations for statin therapy have not been heeded. 3. No heart failure or cardiac dysfunction.  Clinical follow-up in one year. Maintain an active lifestyle. Lipid panel will be discussed once available.    Medication Adjustments/Labs and Tests Ordered: Current medicines are reviewed at length with the patient today.  Concerns regarding medicines are outlined above.  Medication changes, Labs and Tests ordered today are listed in the Patient Instructions below. Patient Instructions  Medication Instructions:  None  Labwork: Lipid today  Testing/Procedures: None  Follow-Up: Your physician wants you to follow-up in: 1 year with Dr. Katrinka Blazing.  You will receive a reminder letter in the mail two months in advance. If you don't receive a letter, please call our office to schedule the follow-up appointment.   Any Other Special Instructions Will Be Listed Below (If Applicable).     If you need a refill on your cardiac medications before your next appointment, please call your pharmacy.      Signed, Lesleigh Noe, MD  06/27/2017 12:37 PM    [facility] [address] [place], [place], [place]  [address] Phone: [phone]; Fax: [phone]

## 2017-06-27 NOTE — Patient Instructions (Signed)
Medication Instructions:  None  Labwork: Lipid today  Testing/Procedures: None  Follow-Up: Your physician wants you to follow-up in: 1 year with Dr. Katrinka Blazing.  You will receive a reminder letter in the mail two months in advance. If you don't receive a letter, please call our office to schedule the follow-up appointment.   Any Other Special Instructions Will Be Listed Below (If Applicable).     If you need a refill on your cardiac medications before your next appointment, please call your pharmacy.

## 2017-06-28 LAB — LIPID PANEL
CHOL/HDL RATIO: 3.5 ratio (ref 0.0–5.0)
CHOLESTEROL TOTAL: 211 mg/dL — AB (ref 100–199)
HDL: 60 mg/dL (ref >39)
LDL Calculated: 131 mg/dL — ABNORMAL HIGH (ref 0–99)
TRIGLYCERIDES: 98 mg/dL (ref 0–149)
VLDL CHOLESTEROL CAL: 20 mg/dL (ref 5–40)

## 2017-11-16 DIAGNOSIS — H25043 Posterior subcapsular polar age-related cataract, bilateral: Secondary | ICD-10-CM | POA: Diagnosis not present

## 2017-11-16 DIAGNOSIS — H348122 Central retinal vein occlusion, left eye, stable: Secondary | ICD-10-CM | POA: Diagnosis not present

## 2017-11-21 DIAGNOSIS — Z0000 Encounter for general adult medical examination without abnormal findings: Secondary | ICD-10-CM | POA: Diagnosis not present

## 2017-11-21 DIAGNOSIS — I208 Other forms of angina pectoris: Secondary | ICD-10-CM | POA: Diagnosis not present

## 2017-11-21 DIAGNOSIS — Z131 Encounter for screening for diabetes mellitus: Secondary | ICD-10-CM | POA: Diagnosis not present

## 2017-11-21 DIAGNOSIS — I2510 Atherosclerotic heart disease of native coronary artery without angina pectoris: Secondary | ICD-10-CM | POA: Diagnosis not present

## 2017-11-21 DIAGNOSIS — Z1389 Encounter for screening for other disorder: Secondary | ICD-10-CM | POA: Diagnosis not present

## 2017-11-21 DIAGNOSIS — E7800 Pure hypercholesterolemia, unspecified: Secondary | ICD-10-CM | POA: Diagnosis not present

## 2018-05-26 DIAGNOSIS — W298XXA Contact with other powered powered hand tools and household machinery, initial encounter: Secondary | ICD-10-CM | POA: Diagnosis not present

## 2018-05-26 DIAGNOSIS — Y9289 Other specified places as the place of occurrence of the external cause: Secondary | ICD-10-CM | POA: Diagnosis not present

## 2018-05-26 DIAGNOSIS — S62631B Displaced fracture of distal phalanx of left index finger, initial encounter for open fracture: Secondary | ICD-10-CM | POA: Diagnosis not present

## 2018-05-26 DIAGNOSIS — S60121A Contusion of right index finger with damage to nail, initial encounter: Secondary | ICD-10-CM | POA: Diagnosis not present

## 2018-05-26 DIAGNOSIS — S61310A Laceration without foreign body of right index finger with damage to nail, initial encounter: Secondary | ICD-10-CM | POA: Diagnosis not present

## 2018-05-26 DIAGNOSIS — S61210A Laceration without foreign body of right index finger without damage to nail, initial encounter: Secondary | ICD-10-CM | POA: Diagnosis not present

## 2018-05-26 DIAGNOSIS — Y990 Civilian activity done for income or pay: Secondary | ICD-10-CM | POA: Diagnosis not present

## 2018-05-26 DIAGNOSIS — Y9389 Activity, other specified: Secondary | ICD-10-CM | POA: Diagnosis not present

## 2018-05-26 DIAGNOSIS — S62630B Displaced fracture of distal phalanx of right index finger, initial encounter for open fracture: Secondary | ICD-10-CM | POA: Diagnosis not present

## 2018-05-31 DIAGNOSIS — S62631B Displaced fracture of distal phalanx of left index finger, initial encounter for open fracture: Secondary | ICD-10-CM | POA: Diagnosis not present

## 2018-05-31 DIAGNOSIS — S61311A Laceration without foreign body of left index finger with damage to nail, initial encounter: Secondary | ICD-10-CM | POA: Diagnosis not present

## 2018-06-06 DIAGNOSIS — Z4802 Encounter for removal of sutures: Secondary | ICD-10-CM | POA: Diagnosis not present

## 2018-06-20 NOTE — Progress Notes (Signed)
Cardiology Office Note   Date:  06/21/2018   ID:  Philip Parsons, Philip Parsons [birth year], MRN [id number]  PCP:  Blair Heys, MD  Cardiologist:    Dr. Katrinka Blazing  Chief Complaint  Patient presents with  . Coronary Artery Disease      History of Present Illness: Philip Parsons is a 77 y.o. male who presents for CAD.    Hx of LAD Promus DES 2013, hyperlipidemia,and family history of CAD.  He has significant LDL elevation, frequently greater than 2 times normal/target. We discussed his cholesterol and will check today.  His daughter is a Control and instrumentation engineer and he will talk with her about the results.   He has not needed to use nitroglycerin. Overall he feels well.  LDL 141  No chest pain or SOB.  He is active, still works on [employer].     Past Medical History:  Diagnosis Date  . Coronary artery disease   . History of intravascular stent placement   . No pertinent past medical history     Past Surgical History:  Procedure Laterality Date  . LEFT HEART CATH N/A 02/02/2012   Procedure: LEFT HEART CATH;  Surgeon: Robynn Pane, MD;  Location: [facility] CATH LAB;  Service: Cardiovascular;  Laterality: N/A;  . LEFT HEART CATHETERIZATION WITH CORONARY ANGIOGRAM N/A 05/10/2013   Procedure: LEFT HEART CATHETERIZATION WITH CORONARY ANGIOGRAM;  Surgeon: Lesleigh Noe, MD;  Location: [facility] CATH LAB;  Service: Cardiovascular;  Laterality: N/A;  . NO PAST SURGERIES       Current Outpatient Medications  Medication Sig Dispense Refill  . aspirin 81 MG tablet Take 81 mg by mouth daily.    . cholecalciferol (VITAMIN D) 1000 UNITS tablet Take 1,000 Units by mouth daily.    . Cyanocobalamin (VITAMIN B-12 PO) Take 1 tablet by mouth daily.    . nitroGLYCERIN (NITROSTAT) 0.4 MG SL tablet Place 1 tablet (0.4 mg total) under the tongue every 5 (five) minutes as needed for chest pain. 25 tablet 3  . vitamin C (ASCORBIC ACID) 500 MG tablet Take 500 mg by mouth daily.    Marland Kitchen VITAMIN E PO Take 1 capsule by mouth daily.     No  current facility-administered medications for this visit.     Allergies:   Patient has no known allergies.    Social History:  The patient  reports that he has quit smoking. He has never used smokeless tobacco. He reports that he does not drink alcohol or use drugs.   Family History:  The patient's family history includes Arthritis in his mother; Healthy in his father; Heart disease in his sister.    ROS:  General:no colds or fevers, no weight changes Skin:no rashes or ulcers HEENT:no blurred vision, no congestion CV:see HPI PUL:see HPI GI:no diarrhea constipation or melena, no indigestion GU:no hematuria, no dysuria MS:no joint pain, no claudication Neuro:no syncope, no lightheadedness Endo:no diabetes, no thyroid disease  Wt Readings from Last 3 Encounters:  06/21/18 164 lb (74.4 kg)  06/27/17 166 lb 6.4 oz (75.5 kg)  06/23/16 168 lb (76.2 kg)     PHYSICAL EXAM: VS:  BP (!) 150/72   Pulse 60   Ht 5\' 9"  (1.753 m)   Wt 164 lb (74.4 kg)   BMI 24.22 kg/m  , BMI Body mass index is 24.22 kg/m. General:Pleasant affect, NAD Skin:Warm and dry, brisk capillary refill HEENT:normocephalic, sclera clear, mucus membranes moist Neck:supple, no JVD, no bruits  Heart:S1S2 RRR without murmur, gallup,  rub or click Lungs:clear without rales, rhonchi, or wheezes QMG:QQPY, non tender, + BS, do not palpate liver spleen or masses Ext:no lower ext edema, 2+ pedal pulses, 2+ radial pulses Neuro:alert and oriented, MAE, follows commands, + facial symmetry    EKG:  EKG is ordered today. The ekg ordered today demonstrates SR normal EKG and no acute changes.    Recent Labs: No results found for requested labs within last 8760 hours.    Lipid Panel    Component Value Date/Time   CHOL 211 (H) 06/27/2017 1227   TRIG 98 06/27/2017 1227   HDL 60 06/27/2017 1227   CHOLHDL 3.5 06/27/2017 1227   CHOLHDL 4 06/27/2015 1031   VLDL 17.6 06/27/2015 1031   LDLCALC 131 (H) 06/27/2017 1227    LDLDIRECT 152.5 10/15/2013 1131       Other studies Reviewed: Additional studies/ records that were reviewed today include:last OV.   ASSESSMENT AND PLAN:  1.  CAD with hx of stent.  Stable without angina, EKG without acute changes.    2.   HTN - BP is elevated some today.  Will monitor.  3.  HLD will recheck lipids and hepatic today.    Follow up with Dr. Katrinka Blazing in 1 year.    Current medicines are reviewed with the patient today.  The patient Has no concerns regarding medicines.  The following changes have been made:  See above Labs/ tests ordered today include:see above  Disposition:   FU:  see above  Signed, Nada Boozer, NP  06/21/2018 11:34 AM    [facility] [address] [place], [place], [place]  [address]/ [address] [place], [place] Phone: [phone]; Fax: [phone]  [phone]

## 2018-06-21 ENCOUNTER — Encounter (INDEPENDENT_AMBULATORY_CARE_PROVIDER_SITE_OTHER): Payer: Self-pay

## 2018-06-21 ENCOUNTER — Ambulatory Visit (INDEPENDENT_AMBULATORY_CARE_PROVIDER_SITE_OTHER): Payer: Medicare Other | Admitting: Cardiology

## 2018-06-21 ENCOUNTER — Encounter: Payer: Self-pay | Admitting: Cardiology

## 2018-06-21 VITALS — BP 150/72 | HR 60 | Ht 69.0 in | Wt 164.0 lb

## 2018-06-21 DIAGNOSIS — I10 Essential (primary) hypertension: Secondary | ICD-10-CM

## 2018-06-21 DIAGNOSIS — I2510 Atherosclerotic heart disease of native coronary artery without angina pectoris: Secondary | ICD-10-CM | POA: Diagnosis not present

## 2018-06-21 DIAGNOSIS — E7800 Pure hypercholesterolemia, unspecified: Secondary | ICD-10-CM

## 2018-06-21 LAB — HEPATIC FUNCTION PANEL
ALT: 12 IU/L (ref 0–44)
AST: 19 IU/L (ref 0–40)
Albumin: 4 g/dL (ref 3.5–4.8)
Alkaline Phosphatase: 61 IU/L (ref 39–117)
Bilirubin Total: 0.3 mg/dL (ref 0.0–1.2)
Bilirubin, Direct: 0.09 mg/dL (ref 0.00–0.40)
Total Protein: 6.9 g/dL (ref 6.0–8.5)

## 2018-06-21 LAB — LIPID PANEL
CHOL/HDL RATIO: 3.6 ratio (ref 0.0–5.0)
Cholesterol, Total: 221 mg/dL — ABNORMAL HIGH (ref 100–199)
HDL: 62 mg/dL (ref >39)
LDL Calculated: 138 mg/dL — ABNORMAL HIGH (ref 0–99)
Triglycerides: 104 mg/dL (ref 0–149)
VLDL Cholesterol Cal: 21 mg/dL (ref 5–40)

## 2018-06-21 MED ORDER — NITROGLYCERIN 0.4 MG SL SUBL: 0.4000 mg | SUBLINGUAL_TABLET | SUBLINGUAL | 3 refills | Status: DC | PRN

## 2018-06-21 NOTE — Patient Instructions (Signed)
Medication Instructions:   Your physician recommends that you continue on your current medications as directed. Please refer to the Current Medication list given to you today.    If you need a refill on your cardiac medications before your next appointment, please call your pharmacy.  Labwork:  LFT AND LIPIDS  TODAY    Testing/Procedures: NONE ORDERED  TODAY   Follow-Up: Your physician wants you to follow-up in: ONE YEAR WITH  SMITH   You will receive a reminder letter in the mail two months in advance. If you don't receive a letter, please call our office to schedule the follow-up appointment.     Any Other Special Instructions Will Be Listed Below (If Applicable).

## 2018-06-22 ENCOUNTER — Telehealth: Payer: Self-pay | Admitting: *Deleted

## 2018-06-22 DIAGNOSIS — E7849 Other hyperlipidemia: Secondary | ICD-10-CM

## 2018-06-22 MED ORDER — ROSUVASTATIN CALCIUM 10 MG PO TABS: 10.0000 mg | ORAL_TABLET | Freq: Every day | ORAL | 3 refills | Status: DC

## 2018-06-22 NOTE — Telephone Encounter (Signed)
Spoke with pt and went over results and recommendations per Dr. Katrinka Blazing.  Pt verbalized understanding and was in agreement with this plan. Pt will have labs drawn 12/27.

## 2018-06-22 NOTE — Telephone Encounter (Signed)
-----   Message from Leone Brand, NP sent at 06/21/2018  5:27 PM EDT ----- Lipids are still elevated.  Would like him on a statin his LDL is 138 and would prefer <70.   Would do crestor 10 mg daily. And recheck hepatic and lipids in 3 months.

## 2018-08-17 ENCOUNTER — Ambulatory Visit: Payer: Medicare Other | Admitting: Interventional Cardiology

## 2018-09-22 ENCOUNTER — Other Ambulatory Visit: Payer: Medicare Other

## 2019-02-08 ENCOUNTER — Ambulatory Visit
Admission: RE | Admit: 2019-02-08 | Discharge: 2019-02-08 | Disposition: A | Discharge status: Home / Self Care | Payer: Medicare Other | Source: Ambulatory Visit | Attending: Family Medicine | Admitting: Family Medicine

## 2019-02-08 ENCOUNTER — Other Ambulatory Visit: Payer: Self-pay

## 2019-02-08 ENCOUNTER — Other Ambulatory Visit: Payer: Self-pay | Admitting: Family Medicine

## 2019-02-08 DIAGNOSIS — M79672 Pain in left foot: Secondary | ICD-10-CM

## 2019-02-08 DIAGNOSIS — M25572 Pain in left ankle and joints of left foot: Secondary | ICD-10-CM | POA: Diagnosis not present

## 2019-03-26 DIAGNOSIS — I2510 Atherosclerotic heart disease of native coronary artery without angina pectoris: Secondary | ICD-10-CM | POA: Diagnosis not present

## 2019-03-26 DIAGNOSIS — Z0000 Encounter for general adult medical examination without abnormal findings: Secondary | ICD-10-CM | POA: Diagnosis not present

## 2019-03-26 DIAGNOSIS — Z1389 Encounter for screening for other disorder: Secondary | ICD-10-CM | POA: Diagnosis not present

## 2019-03-26 DIAGNOSIS — E7800 Pure hypercholesterolemia, unspecified: Secondary | ICD-10-CM | POA: Diagnosis not present

## 2019-03-26 DIAGNOSIS — I208 Other forms of angina pectoris: Secondary | ICD-10-CM | POA: Diagnosis not present

## 2019-07-28 NOTE — Progress Notes (Signed)
Cardiology Office Note:    Date:  07/30/2019   ID:  Rydell, Wiegel [birth year], MRN [id number]  PCP:  Gaynelle Arabian, MD  Cardiologist:  Sinclair Grooms, MD   Referring MD: Gaynelle Arabian, MD   Chief Complaint  Patient presents with  . Coronary Artery Disease  . Hypertension    History of Present Illness:    Philip Parsons is a 78 y.o. male with a hx of  LAD Promus DES 2013, hyperlipidemia, and family history of CAD.  No complaints.  Remains skeptical of medical therapy.  Walks regularly.  States that he cannot remember ever being on a statin.  I last recommended therapy was rosuvastatin 10 mg/day.  This was pre-September 2019.   Past Medical History:  Diagnosis Date  . Coronary artery disease   . History of intravascular stent placement   . No pertinent past medical history     Past Surgical History:  Procedure Laterality Date  . LEFT HEART CATH N/A 02/02/2012   Procedure: LEFT HEART CATH;  Surgeon: Clent Demark, MD;  [facility] CATH LAB;  Service: Cardiovascular;  Laterality: N/A;  . LEFT HEART CATHETERIZATION WITH CORONARY ANGIOGRAM N/A 05/10/2013   Procedure: LEFT HEART CATHETERIZATION WITH CORONARY ANGIOGRAM;  Surgeon: Sinclair Grooms, MD;  Location: [facility] CATH LAB;  Service: Cardiovascular;  Laterality: N/A;  . NO PAST SURGERIES      Current Medications: Current Meds  Medication Sig  . aspirin 81 MG tablet Take 81 mg by mouth daily.  . cholecalciferol (VITAMIN D) 1000 UNITS tablet Take 1,000 Units by mouth daily.  . Cyanocobalamin (VITAMIN B-12 PO) Take 1 tablet by mouth daily.  . nitroGLYCERIN (NITROSTAT) 0.4 MG SL tablet Place 1 tablet (0.4 mg total) under the tongue every 5 (five) minutes as needed for chest pain.  . rosuvastatin (CRESTOR) 10 MG tablet Take 1 tablet (10 mg total) by mouth daily.  . vitamin C (ASCORBIC ACID) 500 MG tablet Take 500 mg by mouth daily.  Marland Kitchen VITAMIN E PO Take 1 capsule by mouth daily.     Allergies:   Patient has no known  allergies.   Social History   Socioeconomic History  . Marital status: Married    Spouse name: Not on file  . Number of children: Not on file  . Years of education: Not on file  . Highest education level: Not on file  Occupational History  . Not on file  Social Needs  . Financial resource strain: Not on file  . Food insecurity    Worry: Not on file    Inability: Not on file  . Transportation needs    Medical: Not on file    Non-medical: Not on file  Tobacco Use  . Smoking status: Former Research scientist (life sciences)  . Smokeless tobacco: Never Used  Substance and Sexual Activity  . Alcohol use: No    Alcohol/week: 0.0 standard drinks  . Drug use: No  . Sexual activity: Yes  Lifestyle  . Physical activity    Days per week: Not on file    Minutes per session: Not on file  . Stress: Not on file  Relationships  . Social Herbalist on phone: Not on file    Gets together: Not on file    Attends religious service: Not on file    Active member of club or organization: Not on file    Attends meetings of clubs or organizations: Not on file  Relationship status: Not on file  Other Topics Concern  . Not on file  Social History Narrative  . Not on file     Family History: The patient's family history includes Arthritis in his mother; Healthy in his father; Heart disease in his sister.  ROS:   Please see the history of present illness.    He walks regularly.  He sleeps well.  He has no particular complaints.  All other systems reviewed and are negative.  EKGs/Labs/Other Studies Reviewed:    The following studies were reviewed today: No new functional data  EKG:  EKG performed 07/30/2019 demonstrates sinus bradycardia and is otherwise unremarkable.  Recent Labs: No results found for requested labs within last 8760 hours.  Recent Lipid Panel    Component Value Date/Time   CHOL 221 (H) 06/21/2018 1150   TRIG 104 06/21/2018 1150   HDL 62 06/21/2018 1150   CHOLHDL 3.6 06/21/2018  1150   CHOLHDL 4 06/27/2015 1031   VLDL 17.6 06/27/2015 1031   LDLCALC 138 (H) 06/21/2018 1150   LDLDIRECT 152.5 10/15/2013 1131    Physical Exam:    VS:  Ht 5\' 9"  (1.753 m)   BMI 24.22 kg/m     Wt Readings from Last 3 Encounters:  06/21/18 164 lb (74.4 kg)  06/27/17 166 lb 6.4 oz (75.5 kg)  06/23/16 168 lb (76.2 kg)     GEN: Slender and healthy appearing. No acute distress HEENT: Normal NECK: No JVD. LYMPHATICS: No lymphadenopathy CARDIAC:  RRR without murmur, gallop, or edema. VASCULAR:  Normal Pulses. No bruits. RESPIRATORY:  Clear to auscultation without rales, wheezing or rhonchi  ABDOMEN: Soft, non-tender, non-distended, No pulsatile mass, MUSCULOSKELETAL: No deformity  SKIN: Warm and dry NEUROLOGIC:  Alert and oriented x 3 PSYCHIATRIC:  Normal affect   ASSESSMENT:    1. Coronary artery disease involving native coronary artery of native heart without angina pectoris   2. Other hyperlipidemia   3. Essential hypertension   4. Educated about COVID-19 virus infection    PLAN:    In order of problems listed above:  1. Secondary risk prevention discussed.  He is antimedication.  We discussed the significant abnormality of LDL.  Now that he is 78 the target blood pressure is 140/80 mmHg or less. 2. Lipid panel will be obtained today. 3. Low-salt diet, weight control, exercise all discussed.  New target now that he is 78 years of age is 140/80 mmHg. 4. He is wearing a mask.  Advocates to 3W's as lifestyle changes.  Overall education and awareness concerning primary/secondary risk prevention was discussed in detail: LDL less than 70, hemoglobin A1c less than 7, blood pressure target less than 130/80 mmHg, >150 minutes of moderate aerobic activity per week, avoidance of smoking, weight control (via diet and exercise), and continued surveillance/management of/for obstructive sleep apnea.    Medication Adjustments/Labs and Tests Ordered: Current medicines are reviewed  at length with the patient today.  Concerns regarding medicines are outlined above.  No orders of the defined types were placed in this encounter.  No orders of the defined types were placed in this encounter.   There are no Patient Instructions on file for this visit.   Signed, 96, MD  07/30/2019 8:06 AM    [facility]

## 2019-07-30 ENCOUNTER — Encounter: Payer: Self-pay | Admitting: Interventional Cardiology

## 2019-07-30 ENCOUNTER — Encounter (INDEPENDENT_AMBULATORY_CARE_PROVIDER_SITE_OTHER): Payer: Self-pay

## 2019-07-30 ENCOUNTER — Ambulatory Visit (INDEPENDENT_AMBULATORY_CARE_PROVIDER_SITE_OTHER): Payer: Medicare Other | Admitting: Interventional Cardiology

## 2019-07-30 ENCOUNTER — Other Ambulatory Visit: Payer: Self-pay

## 2019-07-30 VITALS — BP 146/76 | HR 57 | Ht 69.0 in | Wt 166.8 lb

## 2019-07-30 DIAGNOSIS — E7849 Other hyperlipidemia: Secondary | ICD-10-CM | POA: Diagnosis not present

## 2019-07-30 DIAGNOSIS — Z7189 Other specified counseling: Secondary | ICD-10-CM

## 2019-07-30 DIAGNOSIS — I2510 Atherosclerotic heart disease of native coronary artery without angina pectoris: Secondary | ICD-10-CM

## 2019-07-30 DIAGNOSIS — I10 Essential (primary) hypertension: Secondary | ICD-10-CM

## 2019-07-30 LAB — LIPID PANEL
Chol/HDL Ratio: 4 ratio (ref 0.0–5.0)
Cholesterol, Total: 235 mg/dL — ABNORMAL HIGH (ref 100–199)
HDL: 59 mg/dL (ref >39)
LDL Chol Calc (NIH): 154 mg/dL — ABNORMAL HIGH (ref 0–99)
Triglycerides: 123 mg/dL (ref 0–149)
VLDL Cholesterol Cal: 22 mg/dL (ref 5–40)

## 2019-07-30 NOTE — Patient Instructions (Signed)
Medication Instructions:  Your physician recommends that you continue on your current medications as directed. Please refer to the Current Medication list given to you today.  *If you need a refill on your cardiac medications before your next appointment, please call your pharmacy*  Lab Work: Lipid today  If you have labs (blood work) drawn today and your tests are completely normal, you will receive your results only by: Marland Kitchen MyChart Message (if you have MyChart) OR . A paper copy in the mail If you have any lab test that is abnormal or we need to change your treatment, we will call you to review the results.  Testing/Procedures: None  Follow-Up: At [facility], you and your health needs are our priority.  As part of our continuing mission to provide you with exceptional heart care, we have created designated Provider Care Teams.  These Care Teams include your primary Cardiologist (physician) and Advanced Practice Providers (APPs -  Physician Assistants and Nurse Practitioners) who all work together to provide you with the care you need, when you need it.  Your next appointment:   12 months  The format for your next appointment:   In Person  Provider:   You may see Sinclair Grooms, MD or one of the following Advanced Practice Providers on your designated Care Team:    Truitt Merle, NP  Cecilie Kicks, NP  Kathyrn Drown, NP   Other Instructions

## 2020-01-03 DIAGNOSIS — H34812 Central retinal vein occlusion, left eye, with macular edema: Secondary | ICD-10-CM | POA: Diagnosis not present

## 2020-01-30 DIAGNOSIS — M25512 Pain in left shoulder: Secondary | ICD-10-CM | POA: Diagnosis not present

## 2020-02-01 DIAGNOSIS — M67912 Unspecified disorder of synovium and tendon, left shoulder: Secondary | ICD-10-CM | POA: Diagnosis not present

## 2020-02-06 DIAGNOSIS — H34812 Central retinal vein occlusion, left eye, with macular edema: Secondary | ICD-10-CM | POA: Diagnosis not present

## 2020-02-06 DIAGNOSIS — H47393 Other disorders of optic disc, bilateral: Secondary | ICD-10-CM | POA: Diagnosis not present

## 2020-04-17 DIAGNOSIS — Z131 Encounter for screening for diabetes mellitus: Secondary | ICD-10-CM | POA: Diagnosis not present

## 2020-04-17 DIAGNOSIS — Z1389 Encounter for screening for other disorder: Secondary | ICD-10-CM | POA: Diagnosis not present

## 2020-04-17 DIAGNOSIS — E7800 Pure hypercholesterolemia, unspecified: Secondary | ICD-10-CM | POA: Diagnosis not present

## 2020-04-17 DIAGNOSIS — Z0000 Encounter for general adult medical examination without abnormal findings: Secondary | ICD-10-CM | POA: Diagnosis not present

## 2020-04-17 DIAGNOSIS — I208 Other forms of angina pectoris: Secondary | ICD-10-CM | POA: Diagnosis not present

## 2020-04-17 DIAGNOSIS — I25119 Atherosclerotic heart disease of native coronary artery with unspecified angina pectoris: Secondary | ICD-10-CM | POA: Diagnosis not present

## 2020-04-23 ENCOUNTER — Other Ambulatory Visit: Payer: Self-pay

## 2020-04-23 DIAGNOSIS — I2510 Atherosclerotic heart disease of native coronary artery without angina pectoris: Secondary | ICD-10-CM

## 2020-04-23 MED ORDER — NITROGLYCERIN 0.4 MG SL SUBL: 0.4000 mg | SUBLINGUAL_TABLET | SUBLINGUAL | 0 refills | Status: DC | PRN

## 2020-05-13 DIAGNOSIS — H2513 Age-related nuclear cataract, bilateral: Secondary | ICD-10-CM | POA: Diagnosis not present

## 2020-05-13 DIAGNOSIS — H18593 Other hereditary corneal dystrophies, bilateral: Secondary | ICD-10-CM | POA: Diagnosis not present

## 2020-05-13 DIAGNOSIS — H25043 Posterior subcapsular polar age-related cataract, bilateral: Secondary | ICD-10-CM | POA: Diagnosis not present

## 2020-05-13 DIAGNOSIS — H25013 Cortical age-related cataract, bilateral: Secondary | ICD-10-CM | POA: Diagnosis not present

## 2020-05-13 DIAGNOSIS — H401131 Primary open-angle glaucoma, bilateral, mild stage: Secondary | ICD-10-CM | POA: Diagnosis not present

## 2020-05-13 DIAGNOSIS — H2512 Age-related nuclear cataract, left eye: Secondary | ICD-10-CM | POA: Diagnosis not present

## 2020-05-14 DIAGNOSIS — H34812 Central retinal vein occlusion, left eye, with macular edema: Secondary | ICD-10-CM | POA: Diagnosis not present

## 2020-06-23 DIAGNOSIS — H2512 Age-related nuclear cataract, left eye: Secondary | ICD-10-CM | POA: Diagnosis not present

## 2020-06-24 DIAGNOSIS — H25011 Cortical age-related cataract, right eye: Secondary | ICD-10-CM | POA: Diagnosis not present

## 2020-06-24 DIAGNOSIS — H2511 Age-related nuclear cataract, right eye: Secondary | ICD-10-CM | POA: Diagnosis not present

## 2020-06-24 DIAGNOSIS — H25041 Posterior subcapsular polar age-related cataract, right eye: Secondary | ICD-10-CM | POA: Diagnosis not present

## 2020-07-14 DIAGNOSIS — H2511 Age-related nuclear cataract, right eye: Secondary | ICD-10-CM | POA: Diagnosis not present

## 2020-07-30 DIAGNOSIS — H34812 Central retinal vein occlusion, left eye, with macular edema: Secondary | ICD-10-CM | POA: Diagnosis not present

## 2020-08-15 ENCOUNTER — Telehealth: Payer: Self-pay | Admitting: Interventional Cardiology

## 2020-08-15 NOTE — Telephone Encounter (Signed)
Patient would like a recommendation of another doctor in [place], because his son lives there and has heart problems.

## 2020-08-18 NOTE — Telephone Encounter (Signed)
Spoke with Dr. Katrinka Blazing this morning and he does not know any cardiologists in [facility].  Spoke with Philip Parsons and made him aware.  Philip Parsons appreciative for call.

## 2020-09-10 DIAGNOSIS — H34812 Central retinal vein occlusion, left eye, with macular edema: Secondary | ICD-10-CM | POA: Diagnosis not present

## 2020-12-06 NOTE — Progress Notes (Unsigned)
Cardiology Office Note:    Date:  12/11/2020   ID:  Philip Parsons, DOB 1941/08/29, MRN [id number]  PCP:  Blair Heys, MD  Cardiologist:  Lesleigh Noe, MD   Referring MD: Blair Heys, MD   Chief Complaint  Patient presents with  . Coronary Artery Disease    History of Present Illness:    Philip Parsons is a 80 y.o. male with a hx of LAD Promus DES 2013, hyperlipidemia, and family history of CAD.  LDL is historically untreated at the patient's insistence.  He does not want to be on pharmacologic therapy.  No complaints.  He has not had COVID-19.  He is staying active.  He has not needed sublingual nitroglycerin.  No claudication.  Denies orthopnea and PND.   Past Medical History:  Diagnosis Date  . Coronary artery disease   . History of intravascular stent placement   . No pertinent past medical history     Past Surgical History:  Procedure Laterality Date  . LEFT HEART CATH N/A 02/02/2012   Procedure: LEFT HEART CATH;  Surgeon: Robynn Pane, MD;  Location[facility] CATH LAB;  Service: Cardiovascular;  Laterality: N/A;  . LEFT HEART CATHETERIZATION WITH CORONARY ANGIOGRAM N/A 05/10/2013   Procedure: LEFT HEART CATHETERIZATION WITH CORONARY ANGIOGRAM;  Surgeon: Lesleigh Noe, MD;  Location: [facility] CATH LAB;  Service: Cardiovascular;  Laterality: N/A;  . NO PAST SURGERIES      Current Medications: Current Meds  Medication Sig  . aspirin 81 MG tablet Take 81 mg by mouth daily.  . cholecalciferol (VITAMIN D) 1000 UNITS tablet Take 1,000 Units by mouth daily.  . Cyanocobalamin (VITAMIN B-12 PO) Take 1 tablet by mouth daily.  . nitroGLYCERIN (NITROSTAT) 0.4 MG SL tablet PLACE 1 TABLET UNDER THE TONGUE EVERY 5 MINUTES AS NEEDED FOR CHEST PAIN  . vitamin C (ASCORBIC ACID) 500 MG tablet Take 500 mg by mouth daily.  Marland Kitchen VITAMIN E PO Take 1 capsule by mouth daily.     Allergies:   Patient has no known allergies.   Social History   Socioeconomic History  . Marital status:  Married    Spouse name: Not on file  . Number of children: Not on file  . Years of education: Not on file  . Highest education level: Not on file  Occupational History  . Not on file  Tobacco Use  . Smoking status: Former Games developer  . Smokeless tobacco: Never Used  Substance and Sexual Activity  . Alcohol use: No    Alcohol/week: 0.0 standard drinks  . Drug use: No  . Sexual activity: Yes  Other Topics Concern  . Not on file  Social History Narrative  . Not on file   Social Determinants of Health   Financial Resource Strain: Not on file  Food Insecurity: Not on file  Transportation Needs: Not on file  Physical Activity: Not on file  Stress: Not on file  Social Connections: Not on file     Family History: The patient's family history includes Arthritis in his mother; Healthy in his father; Heart disease in his sister.  ROS:   Please see the history of present illness.    No chest discomfort or claudication.  All other systems reviewed and are negative.  EKGs/Labs/Other Studies Reviewed:    The following studies were reviewed today: No new data.  EKG:  EKG normal sinus rhythm normal PR with mild increase in voltage.  Overall no change from historical  tracings.  Recent Labs: No results found for requested labs within last 8760 hours.  Recent Lipid Panel    Component Value Date/Time   CHOL 235 (H) 07/30/2019 0826   TRIG 123 07/30/2019 0826   HDL 59 07/30/2019 0826   CHOLHDL 4.0 07/30/2019 0826   CHOLHDL 4 06/27/2015 1031   VLDL 17.6 06/27/2015 1031   LDLCALC 154 (H) 07/30/2019 0826   LDLDIRECT 152.5 10/15/2013 1131    Physical Exam:    VS:  BP (!) 156/70   Pulse (!) 55   Ht 5\' 9"  (1.753 m)   Wt 161 lb (73 kg)   SpO2 95%   BMI 23.78 kg/m     Wt Readings from Last 3 Encounters:  12/11/20 161 lb (73 kg)  07/30/19 166 lb 12.8 oz (75.7 kg)  06/21/18 164 lb (74.4 kg)     GEN: Slender and healthy appearing. No acute distress HEENT: Normal NECK: No  JVD. LYMPHATICS: No lymphadenopathy CARDIAC: No murmur. RRR no gallop, or edema. VASCULAR:  Normal Pulses. No bruits. RESPIRATORY:  Clear to auscultation without rales, wheezing or rhonchi  ABDOMEN: Soft, non-tender, non-distended, No pulsatile mass, MUSCULOSKELETAL: No deformity  SKIN: Warm and dry NEUROLOGIC:  Alert and oriented x 3 PSYCHIATRIC:  Normal affect   ASSESSMENT:    1. Coronary artery disease involving native coronary artery of native heart without angina pectoris   2. Other hyperlipidemia   3. Essential hypertension    PLAN:    In order of problems listed above:  1. Secondary prevention discussed. 2. He will not use therapy for lipids. 3. Blood pressures at home run less than 140/80.  They are slightly higher here in the office.  Discussed pharmacologic versus lifestyle changes for blood pressure control.  He prefers no therapy.  Overall education and awareness concerning secondary risk prevention was discussed in detail: LDL less than 70, hemoglobin A1c less than 7, blood pressure target less than 130/80 mmHg, >150 minutes of moderate aerobic activity per week, avoidance of smoking, weight control (via diet and exercise), and continued surveillance/management of/for obstructive sleep apnea.  It is certainly a mistake not to be treating lipids but we have discussed this on multiple occasions.  Now the blood pressure is becoming an issue.  He is not willing to consider pharmacologic therapy.  We did discuss sodium restriction and diet changes that can be helpful.    Medication Adjustments/Labs and Tests Ordered: Current medicines are reviewed at length with the patient today.  Concerns regarding medicines are outlined above.  Orders Placed This Encounter  Procedures  . EKG 12-Lead   No orders of the defined types were placed in this encounter.   Patient Instructions  Medication Instructions:  Your physician recommends that you continue on your current  medications as directed. Please refer to the Current Medication list given to you today.  *If you need a refill on your cardiac medications before your next appointment, please call your pharmacy*   Lab Work: None If you have labs (blood work) drawn today and your tests are completely normal, you will receive your results only by: 06/23/18 MyChart Message (if you have MyChart) OR . A paper copy in the mail If you have any lab test that is abnormal or we need to change your treatment, we will call you to review the results.   Testing/Procedures: None   Follow-Up: At [facility], you and your health needs are our priority.  As part of our continuing mission to provide  you with exceptional heart care, we have created designated Provider Care Teams.  These Care Teams include your primary Cardiologist (physician) and Advanced Practice Providers (APPs -  Physician Assistants and Nurse Practitioners) who all work together to provide you with the care you need, when you need it.  We recommend signing up for the patient portal called "MyChart".  Sign up information is provided on this After Visit Summary.  MyChart is used to connect with patients for Virtual Visits (Telemedicine).  Patients are able to view lab/test results, encounter notes, upcoming appointments, etc.  Non-urgent messages can be sent to your provider as well.   To learn more about what you can do with MyChart, go to [email or web address].    Your next appointment:   1 year(s)  The format for your next appointment:   In Person  Provider:   You may see Lesleigh Noe, MD or one of the following Advanced Practice Providers on your designated Care Team:    Georgie Chard, NP    Other Instructions      Signed, Lesleigh Noe, MD  12/11/2020 4:41 PM    [facility]

## 2020-12-08 ENCOUNTER — Other Ambulatory Visit: Payer: Self-pay | Admitting: Interventional Cardiology

## 2020-12-08 DIAGNOSIS — I2510 Atherosclerotic heart disease of native coronary artery without angina pectoris: Secondary | ICD-10-CM

## 2020-12-11 ENCOUNTER — Other Ambulatory Visit: Payer: Self-pay

## 2020-12-11 ENCOUNTER — Ambulatory Visit (INDEPENDENT_AMBULATORY_CARE_PROVIDER_SITE_OTHER): Payer: Medicare Other | Admitting: Interventional Cardiology

## 2020-12-11 ENCOUNTER — Encounter: Payer: Self-pay | Admitting: Interventional Cardiology

## 2020-12-11 VITALS — BP 156/70 | HR 55 | Ht 69.0 in | Wt 161.0 lb

## 2020-12-11 DIAGNOSIS — I10 Essential (primary) hypertension: Secondary | ICD-10-CM

## 2020-12-11 DIAGNOSIS — E7849 Other hyperlipidemia: Secondary | ICD-10-CM | POA: Diagnosis not present

## 2020-12-11 DIAGNOSIS — I2510 Atherosclerotic heart disease of native coronary artery without angina pectoris: Secondary | ICD-10-CM | POA: Diagnosis not present

## 2020-12-11 NOTE — Patient Instructions (Signed)

## 2020-12-24 DIAGNOSIS — H34812 Central retinal vein occlusion, left eye, with macular edema: Secondary | ICD-10-CM | POA: Diagnosis not present

## 2020-12-24 DIAGNOSIS — H4052X1 Glaucoma secondary to other eye disorders, left eye, mild stage: Secondary | ICD-10-CM | POA: Diagnosis not present

## 2021-01-07 DIAGNOSIS — H4052X1 Glaucoma secondary to other eye disorders, left eye, mild stage: Secondary | ICD-10-CM | POA: Diagnosis not present

## 2021-01-21 DIAGNOSIS — H34812 Central retinal vein occlusion, left eye, with macular edema: Secondary | ICD-10-CM | POA: Diagnosis not present

## 2021-02-15 ENCOUNTER — Inpatient Hospital Stay (HOSPITAL_COMMUNITY): Payer: Medicare Other

## 2021-02-15 ENCOUNTER — Inpatient Hospital Stay (HOSPITAL_COMMUNITY)
Admission: EM | Admit: 2021-02-15 | Discharge: 2021-02-17 | DRG: 065 | Disposition: A | Discharge status: Home / Self Care | Payer: Medicare Other | Attending: Internal Medicine | Admitting: Internal Medicine

## 2021-02-15 ENCOUNTER — Other Ambulatory Visit: Payer: Self-pay

## 2021-02-15 ENCOUNTER — Encounter (HOSPITAL_COMMUNITY): Payer: Self-pay | Admitting: Emergency Medicine

## 2021-02-15 ENCOUNTER — Emergency Department (HOSPITAL_COMMUNITY): Payer: Medicare Other

## 2021-02-15 DIAGNOSIS — N179 Acute kidney failure, unspecified: Secondary | ICD-10-CM | POA: Diagnosis present

## 2021-02-15 DIAGNOSIS — E785 Hyperlipidemia, unspecified: Secondary | ICD-10-CM | POA: Diagnosis present

## 2021-02-15 DIAGNOSIS — M5136 Other intervertebral disc degeneration, lumbar region: Secondary | ICD-10-CM | POA: Diagnosis not present

## 2021-02-15 DIAGNOSIS — Z8249 Family history of ischemic heart disease and other diseases of the circulatory system: Secondary | ICD-10-CM

## 2021-02-15 DIAGNOSIS — Z8261 Family history of arthritis: Secondary | ICD-10-CM | POA: Diagnosis not present

## 2021-02-15 DIAGNOSIS — Z955 Presence of coronary angioplasty implant and graft: Secondary | ICD-10-CM

## 2021-02-15 DIAGNOSIS — M109 Gout, unspecified: Secondary | ICD-10-CM | POA: Insufficient documentation

## 2021-02-15 DIAGNOSIS — Z20822 Contact with and (suspected) exposure to covid-19: Secondary | ICD-10-CM | POA: Diagnosis present

## 2021-02-15 DIAGNOSIS — Z28310 Unvaccinated for covid-19: Secondary | ICD-10-CM | POA: Diagnosis not present

## 2021-02-15 DIAGNOSIS — H547 Unspecified visual loss: Secondary | ICD-10-CM | POA: Diagnosis present

## 2021-02-15 DIAGNOSIS — Z87891 Personal history of nicotine dependence: Secondary | ICD-10-CM

## 2021-02-15 DIAGNOSIS — I10 Essential (primary) hypertension: Secondary | ICD-10-CM | POA: Diagnosis present

## 2021-02-15 DIAGNOSIS — R2689 Other abnormalities of gait and mobility: Secondary | ICD-10-CM | POA: Diagnosis not present

## 2021-02-15 DIAGNOSIS — Z886 Allergy status to analgesic agent status: Secondary | ICD-10-CM

## 2021-02-15 DIAGNOSIS — Z888 Allergy status to other drugs, medicaments and biological substances status: Secondary | ICD-10-CM

## 2021-02-15 DIAGNOSIS — M50222 Other cervical disc displacement at C5-C6 level: Secondary | ICD-10-CM | POA: Diagnosis not present

## 2021-02-15 DIAGNOSIS — I6523 Occlusion and stenosis of bilateral carotid arteries: Secondary | ICD-10-CM | POA: Diagnosis not present

## 2021-02-15 DIAGNOSIS — N281 Cyst of kidney, acquired: Secondary | ICD-10-CM | POA: Diagnosis not present

## 2021-02-15 DIAGNOSIS — R29818 Other symptoms and signs involving the nervous system: Secondary | ICD-10-CM | POA: Diagnosis not present

## 2021-02-15 DIAGNOSIS — R269 Unspecified abnormalities of gait and mobility: Secondary | ICD-10-CM | POA: Diagnosis not present

## 2021-02-15 DIAGNOSIS — I6389 Other cerebral infarction: Secondary | ICD-10-CM | POA: Diagnosis not present

## 2021-02-15 DIAGNOSIS — R4781 Slurred speech: Secondary | ICD-10-CM | POA: Diagnosis not present

## 2021-02-15 DIAGNOSIS — M50221 Other cervical disc displacement at C4-C5 level: Secondary | ICD-10-CM | POA: Diagnosis not present

## 2021-02-15 DIAGNOSIS — Z79899 Other long term (current) drug therapy: Secondary | ICD-10-CM | POA: Diagnosis not present

## 2021-02-15 DIAGNOSIS — M5127 Other intervertebral disc displacement, lumbosacral region: Secondary | ICD-10-CM | POA: Diagnosis not present

## 2021-02-15 DIAGNOSIS — M5126 Other intervertebral disc displacement, lumbar region: Secondary | ICD-10-CM | POA: Diagnosis not present

## 2021-02-15 DIAGNOSIS — M50321 Other cervical disc degeneration at C4-C5 level: Secondary | ICD-10-CM | POA: Diagnosis not present

## 2021-02-15 DIAGNOSIS — I2510 Atherosclerotic heart disease of native coronary artery without angina pectoris: Secondary | ICD-10-CM | POA: Diagnosis present

## 2021-02-15 DIAGNOSIS — I6381 Other cerebral infarction due to occlusion or stenosis of small artery: Principal | ICD-10-CM | POA: Diagnosis present

## 2021-02-15 DIAGNOSIS — M21372 Foot drop, left foot: Secondary | ICD-10-CM | POA: Diagnosis present

## 2021-02-15 DIAGNOSIS — E7800 Pure hypercholesterolemia, unspecified: Secondary | ICD-10-CM | POA: Diagnosis present

## 2021-02-15 DIAGNOSIS — I651 Occlusion and stenosis of basilar artery: Secondary | ICD-10-CM | POA: Diagnosis not present

## 2021-02-15 DIAGNOSIS — R29700 NIHSS score 0: Secondary | ICD-10-CM | POA: Diagnosis present

## 2021-02-15 DIAGNOSIS — R7989 Other specified abnormal findings of blood chemistry: Secondary | ICD-10-CM | POA: Diagnosis present

## 2021-02-15 DIAGNOSIS — R29898 Other symptoms and signs involving the musculoskeletal system: Secondary | ICD-10-CM

## 2021-02-15 DIAGNOSIS — R531 Weakness: Secondary | ICD-10-CM | POA: Diagnosis not present

## 2021-02-15 DIAGNOSIS — I25118 Atherosclerotic heart disease of native coronary artery with other forms of angina pectoris: Secondary | ICD-10-CM | POA: Diagnosis not present

## 2021-02-15 DIAGNOSIS — H3582 Retinal ischemia: Secondary | ICD-10-CM | POA: Diagnosis present

## 2021-02-15 DIAGNOSIS — E7849 Other hyperlipidemia: Secondary | ICD-10-CM | POA: Diagnosis not present

## 2021-02-15 DIAGNOSIS — Z7982 Long term (current) use of aspirin: Secondary | ICD-10-CM

## 2021-02-15 DIAGNOSIS — I639 Cerebral infarction, unspecified: Secondary | ICD-10-CM | POA: Diagnosis not present

## 2021-02-15 LAB — RESP PANEL BY RT-PCR (FLU A&B, COVID) ARPGX2
Influenza A by PCR: NEGATIVE
Influenza B by PCR: NEGATIVE
SARS Coronavirus 2 by RT PCR: NEGATIVE

## 2021-02-15 LAB — COMPREHENSIVE METABOLIC PANEL
ALT: 11 U/L (ref 0–44)
AST: 19 U/L (ref 15–41)
Albumin: 3.8 g/dL (ref 3.5–5.0)
Alkaline Phosphatase: 51 U/L (ref 38–126)
Anion gap: 5 (ref 5–15)
BUN: 25 mg/dL — ABNORMAL HIGH (ref 8–23)
CO2: 29 mmol/L (ref 22–32)
Calcium: 8.7 mg/dL — ABNORMAL LOW (ref 8.9–10.3)
Chloride: 107 mmol/L (ref 98–111)
Creatinine, Ser: 1.45 mg/dL — ABNORMAL HIGH (ref 0.61–1.24)
GFR, Estimated: 48 mL/min — ABNORMAL LOW (ref >60)
Glucose, Bld: 81 mg/dL (ref 70–99)
Potassium: 3.7 mmol/L (ref 3.5–5.1)
Sodium: 141 mmol/L (ref 135–145)
Total Bilirubin: 0.5 mg/dL (ref 0.3–1.2)
Total Protein: 7.5 g/dL (ref 6.5–8.1)

## 2021-02-15 LAB — DIFFERENTIAL
Abs Immature Granulocytes: 0.01 10*3/uL (ref 0.00–0.07)
Basophils Absolute: 0 10*3/uL (ref 0.0–0.1)
Basophils Relative: 0 %
Eosinophils Absolute: 0.2 10*3/uL (ref 0.0–0.5)
Eosinophils Relative: 3 %
Immature Granulocytes: 0 %
Lymphocytes Relative: 32 %
Lymphs Abs: 2.6 10*3/uL (ref 0.7–4.0)
Monocytes Absolute: 1.1 10*3/uL — ABNORMAL HIGH (ref 0.1–1.0)
Monocytes Relative: 14 %
Neutro Abs: 4.1 10*3/uL (ref 1.7–7.7)
Neutrophils Relative %: 51 %

## 2021-02-15 LAB — CBC
HCT: 39.9 % (ref 39.0–52.0)
Hemoglobin: 12.9 g/dL — ABNORMAL LOW (ref 13.0–17.0)
MCH: 30.6 pg (ref 26.0–34.0)
MCHC: 32.3 g/dL (ref 30.0–36.0)
MCV: 94.8 fL (ref 80.0–100.0)
Platelets: 294 10*3/uL (ref 150–400)
RBC: 4.21 MIL/uL — ABNORMAL LOW (ref 4.22–5.81)
RDW: 12.6 % (ref 11.5–15.5)
WBC: 8.1 10*3/uL (ref 4.0–10.5)
nRBC: 0 % (ref 0.0–0.2)

## 2021-02-15 LAB — I-STAT CHEM 8, ED
BUN: 25 mg/dL — ABNORMAL HIGH (ref 8–23)
Calcium, Ion: 1.19 mmol/L (ref 1.15–1.40)
Chloride: 106 mmol/L (ref 98–111)
Creatinine, Ser: 1.4 mg/dL — ABNORMAL HIGH (ref 0.61–1.24)
Glucose, Bld: 74 mg/dL (ref 70–99)
HCT: 38 % — ABNORMAL LOW (ref 39.0–52.0)
Hemoglobin: 12.9 g/dL — ABNORMAL LOW (ref 13.0–17.0)
Potassium: 3.6 mmol/L (ref 3.5–5.1)
Sodium: 142 mmol/L (ref 135–145)
TCO2: 28 mmol/L (ref 22–32)

## 2021-02-15 LAB — CBG MONITORING, ED: Glucose-Capillary: 68 mg/dL — ABNORMAL LOW (ref 70–99)

## 2021-02-15 LAB — ETHANOL: Alcohol, Ethyl (B): <10 mg/dL (ref <10)

## 2021-02-15 LAB — PROTIME-INR
INR: 1 (ref 0.8–1.2)
Prothrombin Time: 13 seconds (ref 11.4–15.2)

## 2021-02-15 LAB — APTT: aPTT: 32 seconds (ref 24–36)

## 2021-02-15 MED ORDER — ASPIRIN 325 MG PO TABS
650.0000 mg | ORAL_TABLET | Freq: Once | ORAL | Status: AC
  Administered 2021-02-15: 650 mg via ORAL
  Filled 2021-02-15: qty 2

## 2021-02-15 MED ORDER — LORAZEPAM 2 MG/ML IJ SOLN: 1.0000 mg | Freq: Once | INTRAMUSCULAR | Status: DC | PRN

## 2021-02-15 NOTE — ED Provider Notes (Signed)
Emergency Medicine Provider Triage Evaluation Note  Philip Parsons , a 80 y.o. male  was evaluated in triage.  Pt complains of left leg dragging while trying to walk, felt like it went to sleep while he was sitting, only notices a problem when walking. Last normal at 3PM.   Review of Systems  Positive: Left leg weakness only with walking Negative: numbness  Physical Exam  BP (!) 155/75 (BP Location: Right Arm)   Pulse 66   Temp 98.2 F (36.8 C) (Oral)   Resp 18   Ht 5\' 9"  (1.753 m)   Wt 74.8 kg   SpO2 100%   BMI 24.37 kg/m  Gen:   Awake, no distress   Resp:  Normal effort  MSK:   Moves extremities without difficulty  Other:  Normal leg strength,   Medical Decision Making  Medically screening exam initiated at 4:38 PM.  Appropriate orders placed.  Philip Parsons was informed that the remainder of the evaluation will be completed by another provider, this initial triage assessment does not replace that evaluation, and the importance of remaining in the ED until their evaluation is complete.     Alonna Minium, PA-C 02/15/21 1659    02/17/21, MD 03/02/21 1047

## 2021-02-15 NOTE — ED Notes (Signed)
Tele neuro nurse in process.

## 2021-02-15 NOTE — ED Notes (Signed)
Report called to Fannie Knee, RN on 3W at [facility]

## 2021-02-15 NOTE — ED Provider Notes (Signed)
[facility]-EMERGENCY DEPT Provider Note   CSN: [id number] Arrival date & time: 02/15/21  1623     History Chief Complaint  Patient presents with  . left leg weakness    Philip Parsons is a 80 y.o. male.  HPI Patient has no prior history of stroke.  He reports that he was sitting at the dining room table talking with family members.  They had just been sitting and chatting for about 20 minutes.  He stood up to walk away from the table and noticed that his left leg was weak.  He reports it was more difficult to pick it up to walk.  He was able to walk but required extra effort to lift the foot.  He thought maybe he just pinched the nerve and it would get better, he tried to walk it off.  He reports the symptoms did not improve and he persisted in having to do extra effort to keep his left leg from dragging while walking.  The symptom has persisted since onset at 3 PM.  He denies any other associated symptoms.  He had no visual loss.  He qualifies that he already has significantly decreased vision in the left eye.  No weakness numbness or tingling of the upper extremities.  No confusion or difficulty speaking.  He reports he has been well leading up to this.  He has not been having fevers chills or any generalized illness.  He has not been suffering from headaches.  Normally he takes walks without difficulty.    Past Medical History:  Diagnosis Date  . Coronary artery disease   . History of intravascular stent placement   . No pertinent past medical history     Patient Active Problem List   Diagnosis Date Noted  . Acute CVA (cerebrovascular accident) (HCC) 02/15/2021  . Gout 02/15/2021  . AKI (acute kidney injury) (HCC) 02/15/2021  . Hyperlipidemia 10/17/2013  . 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 Coronary atherosclerosis of native coronary artery 05/10/2013  . Myocardial infarction, anterolateral wall, acute (HCC) 02/03/2012     Past Surgical History:  Procedure Laterality Date  . LEFT HEART CATH N/A 02/02/2012   Procedure: LEFT HEART CATH;  Surgeon: Robynn Pane, MD;  Location: [facility] CATH LAB;  Service: Cardiovascular;  Laterality: N/A;  . LEFT HEART CATHETERIZATION WITH CORONARY ANGIOGRAM N/A 05/10/2013   Procedure: LEFT HEART CATHETERIZATION WITH CORONARY ANGIOGRAM;  Surgeon: Lesleigh Noe, MD;  Location: [facility] CATH LAB;  Service: Cardiovascular;  Laterality: N/A;  . NO PAST SURGERIES         Family History  Problem Relation Age of Onset  . Arthritis Mother   . Healthy Father   . Heart disease Sister     Social History   Tobacco Use  . Smoking status: Former Games developer  . Smokeless tobacco: Never Used  Substance Use Topics  . Alcohol use: No    Alcohol/week: 0.0 standard drinks  . Drug use: No    Home Medications Prior to Admission medications   Medication Sig Start Date End Date Taking? Authorizing Provider  Ascorbic Acid (VITAMIN C PO) Take 1 tablet by mouth daily.   Yes [provider]  aspirin EC 81 MG tablet Take 81 mg by mouth once a week. Swallow whole.   Yes [provider]  Coenzyme Q10 (COQ10 PO) Take 1 capsule by mouth daily.   Yes [provider]  latanoprost (XALATAN) 0.005 % ophthalmic  solution Place 1 drop into the left eye at bedtime. 01/20/21  Yes [provider]  MAGNESIUM PO Take 1 tablet by mouth daily.   Yes [provider]  nitroGLYCERIN (NITROSTAT) 0.4 MG SL tablet PLACE 1 TABLET UNDER THE TONGUE EVERY 5 MINUTES AS NEEDED FOR CHEST PAIN Patient taking differently: Place 0.4 mg under the tongue every 5 (five) minutes as needed for chest pain. 12/09/20  Yes Lyn Records, MD  VITAMIN D PO Take 1 tablet by mouth daily.   Yes [provider]    Allergies    Meloxicam  Review of Systems   Review of Systems 10 systems reviewed and negative except as per HPI Physical Exam Updated Vital Signs BP (!) 157/64   Pulse (!) 58    Temp 98.2 F (36.8 C) (Oral)   Resp 18   Ht 5\' 9"  (1.753 m)   Wt 74.8 kg   SpO2 100%   BMI 24.37 kg/m   Physical Exam Constitutional:      Appearance: Normal appearance. He is well-developed.  HENT:     Head: Normocephalic and atraumatic.     Mouth/Throat:     Pharynx: Oropharynx is clear.  Eyes:     Extraocular Movements: Extraocular movements intact.     Conjunctiva/sclera: Conjunctivae normal.     Pupils: Pupils are equal, round, and reactive to light.  Cardiovascular:     Rate and Rhythm: Normal rate and regular rhythm.     Heart sounds: Normal heart sounds.  Pulmonary:     Effort: Pulmonary effort is normal.     Breath sounds: Normal breath sounds.  Abdominal:     General: Bowel sounds are normal. There is no distension.     Palpations: Abdomen is soft.     Tenderness: There is no abdominal tenderness.  Musculoskeletal:        General: No swelling or tenderness. Normal range of motion.     Cervical back: Neck supple.     Right lower leg: No edema.     Left lower leg: No edema.  Skin:    General: Skin is warm and dry.  Neurological:     Mental Status: He is alert and oriented to person, place, and time.     GCS: GCS eye subscore is 4. GCS verbal subscore is 5. GCS motor subscore is 6.     Coordination: Coordination normal.     Comments: Alert and oriented x3.  Speech is clear.  No receptive or expressive aphasia.  finger nose exam intact bilaterally.  5\5 motor strength for upper extremity flexion and extension.  5\5 strength for flexion extension in isolated motor groups of the bilateral lower extremities.  Ambulating however patient does have slight asymmetric gait with slight weakness elevating the left leg when taking a step.  He is a little unsteady on this leg.  Extra care required not to fall.  Patellar reflexes 2+ and symmetric  Psychiatric:        Mood and Affect: Mood normal.     ED Results / Procedures / Treatments   Labs (all labs ordered are  listed, but only abnormal results are displayed) Labs Reviewed  CBC - Abnormal; Notable for the following components:      Result Value   RBC 4.21 (*)    Hemoglobin 12.9 (*)    All other components within normal limits  DIFFERENTIAL - Abnormal; Notable for the following components:   Monocytes Absolute 1.1 (*)    All  other components within normal limits  COMPREHENSIVE METABOLIC PANEL - Abnormal; Notable for the following components:   BUN 25 (*)    Creatinine, Ser 1.45 (*)    Calcium 8.7 (*)    GFR, Estimated 48 (*)    All other components within normal limits  I-STAT CHEM 8, ED - Abnormal; Notable for the following components:   BUN 25 (*)    Creatinine, Ser 1.40 (*)    Hemoglobin 12.9 (*)    HCT 38.0 (*)    All other components within normal limits  CBG MONITORING, ED - Abnormal; Notable for the following components:   Glucose-Capillary 68 (*)    All other components within normal limits  RESP PANEL BY RT-PCR (FLU A&B, COVID) ARPGX2  ETHANOL  PROTIME-INR  APTT  RAPID URINE DRUG SCREEN, HOSP PERFORMED  URINALYSIS, ROUTINE W REFLEX MICROSCOPIC    EKG EKG Interpretation  Date/Time:  Sunday Feb 15 2021 17:18:04 EDT Ventricular Rate:  62 PR Interval:  159 QRS Duration: 97 QT Interval:  424 QTC Calculation: 431 R Axis:   42 Text Interpretation: Sinus rhythm Abnormal R-wave progression, early transition no sig changefrom previous Confirmed by Arby Barrette [phone]) on 02/15/2021 6:30:16 PM   Radiology CT HEAD CODE STROKE WO CONTRAST  Result Date: 02/15/2021 CLINICAL DATA:  Code stroke. Acute neuro deficit. Left leg heaviness. Slurred speech. EXAM: CT HEAD WITHOUT CONTRAST TECHNIQUE: Contiguous axial images were obtained from the base of the skull through the vertex without intravenous contrast. COMPARISON:  CT head 06/19/2007 FINDINGS: Brain: Progressive atrophy. Marked atrophy of the hippocampus with large mint of the temporal horns bilaterally which has developed in  the interval. Mild white matter hypodensity bilaterally is symmetric and has progressed in the interval. Negative for acute infarct, hemorrhage, mass Vascular: Negative for hyperdense vessel Skull: Negative Sinuses/Orbits: Paranasal sinuses clear. Bilateral cataract extraction Other: None ASPECTS (Alberta Stroke Program Early CT Score) - Ganglionic level infarction (caudate, lentiform nuclei, internal capsule, insula, M1-M3 cortex): 7 - Supraganglionic infarction (M4-M6 cortex): 3 Total score (0-10 with 10 being normal): 10 IMPRESSION: 1. No acute abnormality 2. ASPECTS is 10 3. Progressive atrophy especially in the hippocampus. Correlate with dementia. Bilateral white matter hypodensity most consistent with chronic microvascular ischemia. 4. These results were called by telephone at the time of interpretation on 02/15/2021 at 5:20 pm to provider Ameet Sandy , who verbally acknowledged these results. Electronically Signed   By: Marlan Palau M.D.   On: 02/15/2021 17:20    Procedures Procedures  CRITICAL CARE Performed by: Arby Barrette   Total critical care time: 30 minutes  Critical care time was exclusive of separately billable procedures and treating other patients.  Critical care was necessary to treat or prevent imminent or life-threatening deterioration.  Critical care was time spent personally by me on the following activities: development of treatment plan with patient and/or surrogate as well as nursing, discussions with consultants, evaluation of patient's response to treatment, examination of patient, obtaining history from patient or surrogate, ordering and performing treatments and interventions, ordering and review of laboratory studies, ordering and review of radiographic studies, pulse oximetry and re-evaluation of patient's condition. Medications Ordered in ED Medications  aspirin tablet 650 mg (has no administration in time range)  LORazepam (ATIVAN) injection 1 mg (has no  administration in time range)    ED Course  I have reviewed the triage vital signs and the nursing notes.  Pertinent labs & imaging results that were available during my care of the patient  were reviewed by me and considered in my medical decision making (see chart for details).    MDM Rules/Calculators/A&P                          Consult: Patient was seen as code stroke by telemetry neuro.  Dr. Otelia Limes has evaluated and made recommendations.  Patient presents as outlined.  Supine and seated patient has symmetric motor strength of the lower extremities.  Walking he has a slight foot drop which requires extra care with ambulating.  This is new for the patient.  He has had no prior problems with asymmetric weakness.  At baseline he reports he is ambulatory without difficulty and takes walks.  At this time patient does not meet criteria for tPA.  Proceed with MRI and admission for stroke work-up. Final Clinical Impression(s) / ED Diagnoses Final diagnoses:  Weakness of left lower extremity  Neurologic gait dysfunction    Rx / DC Orders ED Discharge Orders    None       Arby Barrette, MD 02/15/21 [phone]

## 2021-02-15 NOTE — ED Notes (Signed)
[facility] present for pt transfer.  

## 2021-02-15 NOTE — H&P (Signed)
History and Physical   Philip Parsons:[id number] DOB: 1941/07/12 DOA: 02/15/2021  Referring MD/NP/PA: Dr Donnald Garre  PCP: Blair Heys, MD   Outpatient Specialists: None  Patient coming from: Home  Chief Complaint: Left leg weakness  HPI: Philip Parsons is a 80 y.o. male with medical history significant of coronary artery disease, hypertension, gout, hyperlipidemia who was brought in from home secondary to sudden onset of left leg weakness.  Patient reportedly was sitting at the dining table talking to family members when suddenly he stood up to walk away and the left leg was weak.  He was able to drive get but the symptoms got worse.  This happened hours prior to coming to the ER.  No prior stroke.  No other focal weakness.  No speech changes.  Patient subsequently came to the ER outside the window for tPA.  Initial head CT was negative.  Neurology consulted and recommended admission with stroke work-up.  MRI has been ordered and patient being admitted to the medical service at [facility] with neurology consultation..  ED Course: Temperature is 98.2 blood pressure 180/84, pulse 66 respiratory rate of 21 and oxygen sat 98% on room air.  White count 8.1 hemoglobin 12.9 and platelets 294.  Sodium 142 potassium 3.6 chloride 106 CO2 29 BUN 25 creatinine 1.40 and calcium 8.7.  Head CT without contrast is negative for acute.  EKG showed normal sinus rhythm.  Chest x-ray also negative.  Patient being admitted for stroke work-up  Review of Systems: As per HPI otherwise 10 point review of systems negative.    Past Medical History:  Diagnosis Date  . Coronary artery disease   . History of intravascular stent placement   . No pertinent past medical history     Past Surgical History:  Procedure Laterality Date  . LEFT HEART CATH N/A 02/02/2012   Procedure: LEFT HEART CATH;  Surgeon: Robynn Pane, MD;  Location: [facility] CATH LAB;  Service: Cardiovascular;  Laterality: N/A;  . LEFT HEART  CATHETERIZATION WITH CORONARY ANGIOGRAM N/A 05/10/2013   Procedure: LEFT HEART CATHETERIZATION WITH CORONARY ANGIOGRAM;  Surgeon: Lesleigh Noe, MD;  Location: [facility] CATH LAB;  Service: Cardiovascular;  Laterality: N/A;  . NO PAST SURGERIES       reports that he has quit smoking. He has never used smokeless tobacco. He reports that he does not drink alcohol and does not use drugs.  Allergies  Allergen Reactions  . Meloxicam Other (See Comments)    Feels bad (reported by [facility] 2021)    Family History  Problem Relation Age of Onset  . Arthritis Mother   . Healthy Father   . Heart disease Sister      Prior to Admission medications   Medication Sig Start Date End Date Taking? Authorizing Provider  Ascorbic Acid (VITAMIN C PO) Take 1 tablet by mouth daily.   Yes [provider]  aspirin EC 81 MG tablet Take 81 mg by mouth once a week. Swallow whole.   Yes [provider]  Coenzyme Q10 (COQ10 PO) Take 1 capsule by mouth daily.   Yes [provider]  latanoprost (XALATAN) 0.005 % ophthalmic solution Place 1 drop into the left eye at bedtime. 01/20/21  Yes [provider]  MAGNESIUM PO Take 1 tablet by mouth daily.   Yes [provider]  nitroGLYCERIN (NITROSTAT) 0.4 MG SL tablet PLACE 1 TABLET UNDER THE TONGUE EVERY 5 MINUTES AS NEEDED FOR CHEST PAIN Patient taking differently:  Place 0.4 mg under the tongue every 5 (five) minutes as needed for chest pain. 12/09/20  Yes Lyn Records, MD  VITAMIN D PO Take 1 tablet by mouth daily.   Yes [provider]    Physical Exam: Vitals:   02/15/21 1745 02/15/21 1815 02/15/21 1830 02/15/21 1845  BP: (!) 180/84 (!) 153/66 (!) 139/94 (!) 157/64  Pulse: 65 60 (!) 59 (!) 58  Resp: 18 13 (!) 21 18  Temp:      TempSrc:      SpO2: 98% 98% 99% 100%  Weight:      Height:          Constitutional: Stable, no distress. Vitals:   02/15/21 1745 02/15/21 1815 02/15/21 1830 02/15/21 1845   BP: (!) 180/84 (!) 153/66 (!) 139/94 (!) 157/64  Pulse: 65 60 (!) 59 (!) 58  Resp: 18 13 (!) 21 18  Temp:      TempSrc:      SpO2: 98% 98% 99% 100%  Weight:      Height:       Eyes: PERRL, lids and conjunctivae normal ENMT: Mucous membranes are moist. Posterior pharynx clear of any exudate or lesions.Normal dentition.  Neck: normal, supple, no masses, no thyromegaly Respiratory: clear to auscultation bilaterally, no wheezing, no crackles. Normal respiratory effort. No accessory muscle use.  Cardiovascular: Regular rate and rhythm, no murmurs / rubs / gallops. No extremity edema. 2+ pedal pulses. No carotid bruits.  Abdomen: no tenderness, no masses palpated. No hepatosplenomegaly. Bowel sounds positive.  Musculoskeletal: no clubbing / cyanosis. No joint deformity upper and lower extremities. Good ROM, no contractures. Normal muscle tone.  Skin: no rashes, lesions, ulcers. No induration Neurologic: CN 2-12 grossly intact. Sensation intact, DTR normal. Strength 4/5 in left lower extremity.  5 out of 5 other extremities  psychiatric: Normal judgment and insight. Alert and oriented x 3. Normal mood.     Labs on Admission: I have personally reviewed following labs and imaging studies  CBC: Recent Labs  Lab 02/15/21 1706 02/15/21 1725  WBC 8.1  --   NEUTROABS 4.1  --   HGB 12.9* 12.9*  HCT 39.9 38.0*  MCV 94.8  --   PLT 294  --    Basic Metabolic Panel: Recent Labs  Lab 02/15/21 1706 02/15/21 1725  NA 141 142  K 3.7 3.6  CL 107 106  CO2 29  --   GLUCOSE 81 74  BUN 25* 25*  CREATININE 1.45* 1.40*  CALCIUM 8.7*  --    GFR: Estimated Creatinine Clearance: 40.7 mL/min (A) (by C-G formula based on SCr of 1.4 mg/dL (H)). Liver Function Tests: Recent Labs  Lab 02/15/21 1706  AST 19  ALT 11  ALKPHOS 51  BILITOT 0.5  PROT 7.5  ALBUMIN 3.8   No results for input(s): LIPASE, AMYLASE in the last 168 hours. No results for input(s): AMMONIA in the last 168  hours. Coagulation Profile: Recent Labs  Lab 02/15/21 1706  INR 1.0   Cardiac Enzymes: No results for input(s): CKTOTAL, CKMB, CKMBINDEX, TROPONINI in the last 168 hours. BNP (last 3 results) No results for input(s): PROBNP in the last 8760 hours. HbA1C: No results for input(s): HGBA1C in the last 72 hours. CBG: Recent Labs  Lab 02/15/21 1720  GLUCAP 68*   Lipid Profile: No results for input(s): CHOL, HDL, LDLCALC, TRIG, CHOLHDL, LDLDIRECT in the last 72 hours. Thyroid Function Tests: No results for input(s): TSH, T4TOTAL, FREET4, T3FREE, THYROIDAB in the  last 72 hours. Anemia Panel: No results for input(s): VITAMINB12, FOLATE, FERRITIN, TIBC, IRON, RETICCTPCT in the last 72 hours. Urine analysis: No results found for: COLORURINE, APPEARANCEUR, LABSPEC, PHURINE, GLUCOSEU, HGBUR, BILIRUBINUR, KETONESUR, PROTEINUR, UROBILINOGEN, NITRITE, LEUKOCYTESUR Sepsis Labs: @LABRCNTIP (procalcitonin:4,lacticidven:4) ) Recent Results (from the past 240 hour(s))  Resp Panel by RT-PCR (Flu A&B, Covid) Nasopharyngeal Swab     Status: None   Collection Time: 02/15/21  5:06 PM   Specimen: Nasopharyngeal Swab; Nasopharyngeal(NP) swabs in vial transport medium  Result Value Ref Range Status   SARS Coronavirus 2 by RT PCR NEGATIVE NEGATIVE Final    Comment: (NOTE) SARS-CoV-2 target nucleic acids are NOT DETECTED.  The SARS-CoV-2 RNA is generally detectable in upper respiratory specimens during the acute phase of infection. The lowest concentration of SARS-CoV-2 viral copies this assay can detect is 138 copies/mL. A negative result does not preclude SARS-Cov-2 infection and should not be used as the sole basis for treatment or other patient management decisions. A negative result may occur with  improper specimen collection/handling, submission of specimen other than nasopharyngeal swab, presence of viral mutation(s) within the areas targeted by this assay, and inadequate number of  viral copies(<138 copies/mL). A negative result must be combined with clinical observations, patient history, and epidemiological information. The expected result is Negative.  Fact Sheet for Patients:  02/17/21  Fact Sheet for Healthcare Providers:  BloggerCourse.com  This test is no t yet approved or cleared by the [employer]ious[employer].[place] FDA and  has been authorized for detection and/or diagnosis of SARS-CoV-2 by FDA under an Emergency Use Authorization (EUA). This EUA will remain  in effect (meaning this test can be used) for the duration of the COVID-19 declaration under Section 564(b)(1) of the Act, 21 U.S.C.section 360bbb-3(b)(1), unless the authorization is terminated  or revoked sooner.       Influenza A by PCR NEGATIVE NEGATIVE Final   Influenza B by PCR NEGATIVE NEGATIVE Final    Comment: (NOTE) The Xpert Xpress SARS-CoV-2/FLU/RSV plus assay is intended as an aid in the diagnosis of influenza from Nasopharyngeal swab specimens and should not be used as a sole basis for treatment. Nasal washings and aspirates are unacceptable for Xpert Xpress SARS-CoV-2/FLU/RSV testing.  Fact Sheet for Patients: [place]  Fact Sheet for Healthcare Providers: BloggerCourse.com  This test is not yet approved or cleared by the [employer]ious[employer]ker.it FDA and has been authorized for detection and/or diagnosis of SARS-CoV-2 by FDA under an Emergency Use Authorization (EUA). This EUA will remain in effect (meaning this test can be used) for the duration of the COVID-19 declaration under Section 564(b)(1) of the Act, 21 U.S.C. section 360bbb-3(b)(1), unless the authorization is terminated or revoked.  Performed at [facility], [address] [place], [place]      Radiological Exams on Admission: CT HEAD CODE STROKE WO CONTRAST  Result Date:  02/15/2021 CLINICAL DATA:  Code stroke. Acute neuro deficit. Left leg heaviness. Slurred speech. EXAM: CT HEAD WITHOUT CONTRAST TECHNIQUE: Contiguous axial images were obtained from the base of the skull through the vertex without intravenous contrast. COMPARISON:  CT head 06/19/2007 FINDINGS: Brain: Progressive atrophy. Marked atrophy of the hippocampus with large mint of the temporal horns bilaterally which has developed in the interval. Mild white matter hypodensity bilaterally is symmetric and has progressed in the interval. Negative for acute infarct, hemorrhage, mass Vascular: Negative for hyperdense vessel Skull: Negative Sinuses/Orbits: Paranasal sinuses clear. Bilateral cataract extraction Other: None ASPECTS (Alberta Stroke Program Early CT Score) -  Ganglionic level infarction (caudate, lentiform nuclei, internal capsule, insula, M1-M3 cortex): 7 - Supraganglionic infarction (M4-M6 cortex): 3 Total score (0-10 with 10 being normal): 10 IMPRESSION: 1. No acute abnormality 2. ASPECTS is 10 3. Progressive atrophy especially in the hippocampus. Correlate with dementia. Bilateral white matter hypodensity most consistent with chronic microvascular ischemia. 4. These results were called by telephone at the time of interpretation on 02/15/2021 at 5:20 pm to provider Pfeiffer , who verbally acknowledged these results. Electronically Signed   By: Marlan Palau M.D.   On: 02/15/2021 17:20    EKG: Independently reviewed.  Normal sinus rhythm with no significant findings.  Assessment/Plan Principal Problem:   Acute CVA (cerebrovascular accident) [facility]) Active Problems:   Coronary atherosclerosis of native coronary artery   Hyperlipidemia   AKI (acute kidney injury) (HCC)     #1 suspected CVA: Patient still has some residual weakness on the left leg.  Awaiting MRI results.  Admit the patient to [facility].  Neurology consulted.  May initiate aspirin and statin.  Await recommendations of neurology.  #2  hyperlipidemia: Continue statin.  Currently not on any from home.  #3 coronary artery disease: Stable.  On baby aspirin.  Also nitroglycerin.  #4 hyper tension: Blood pressure appears controlled.  Not on any medication  #5 AKI: Most likely prerenal.  Hydrate and monitor   DVT prophylaxis: Lovenox Code Status: Full code Family Communication: Wife at bedside Disposition Plan: Home Consults called: Dr. Otelia Limes, neurology Admission status: Inpatient  Severity of Illness: The appropriate patient status for this patient is INPATIENT. Inpatient status is judged to be reasonable and necessary in order to provide the required intensity of service to ensure the patient's safety. The patient's presenting symptoms, physical exam findings, and initial radiographic and laboratory data in the context of their chronic comorbidities is felt to place them at high risk for further clinical deterioration. Furthermore, it is not anticipated that the patient will be medically stable for discharge from the hospital within 2 midnights of admission. The following factors support the patient status of inpatient.   " The patient's presenting symptoms include left lower extremity weakness. " The worrisome physical exam findings include decreased power in the left lower extremity. " The initial radiographic and laboratory data are worrisome because of normal hip CT. " The chronic co-morbidities include hypertension and coronary artery disease.   * I certify that at the point of admission it is my clinical judgment that the patient will require inpatient hospital care spanning beyond 2 midnights from the point of admission due to high intensity of service, high risk for further deterioration and high frequency of surveillance required.Lonia Blood MD [facility] Pager [phone]  If 7PM-7AM, please contact night-coverage [email or web address] Password [facility]  02/15/2021, 6:52 PM

## 2021-02-15 NOTE — ED Notes (Signed)
Pt transported to CT ?

## 2021-02-15 NOTE — Consult Note (Signed)
[facility] TeleNeurology Consult Services    Date of Service:  02/15/2021    Metrics: Last Known Well: 1500 Symptoms: As per HPI.  Patient is not a candidate for thrombolytic.   Location of the provider: [facility]  [facility] of the patient: [facility] Pre-Morbid Modified Rankin Scale: 0  This consult was provided via telemedicine with 2-way video and audio communication. The patient/family was informed that care would be provided in this way and agreed to receive care in this manner.   ED Physician notified of diagnostic impression and management plan at: 5:52 PM   Assessment: 80 year old male presenting with acute onset of LLE dragging when ambulating  - Exam reveals antalgic gait with left foot drop. NIHSS of 0.  - CT head: No acute abnormality. ASPECTS is 10. Progressive atrophy especially in the hippocampus. Bilateral white matter hypodensity most consistent with chronic microvascular ischemia   - Stroke risk factors: HLD and CAD - DDx for presentation includes left L5 nerve root compression and right ACA stroke/TIA   Recommendations: - Not a tPA candidate due to NIHSS of 0.  - Admit to [facility] under the medical service - Stroke Team to follow in the AM.  - MRI brain, MRA head, carotid ultrasound and TTE - Cardiac telemetry - PT/OT/Speech - ASA 650 mg crushed x 1 now, then continue with daily ASA 81 mg. - Pending results of work up, may need to add Plavix to his regimen - Permissive HTN x 24 hours. Given advanced age, use modified parameter: Treat if SBP > 180. - MRI of lumbar spine.         ------------------------------------------------------------------------------   History of Present Illness: The patient is an 80 year old male with a PMHx of CAD s/p stent placement and HLD who presents to the [facility] ED via POV for assessment of acute onset of LLE dragging when ambulating. He denies any facial droop, vision change,  confusion, dysarthria, dysphasia, ataxia, left arm weakness or right sided weakness. He is not sure if his LLE is weak or if the only problem is catching and dragging of his left foot when ambulating. He has no extremity numbness or tingling. No low back pain.  He has no prior history of stroke. He takes ASA daily.     Past Medical History: Past Medical History:  Diagnosis Date  . Coronary artery disease   . History of intravascular stent placement   . No pertinent past medical history       Past Surgical History: Past Surgical History:  Procedure Laterality Date  . LEFT HEART CATH N/A 02/02/2012   Procedure: LEFT HEART CATH;  Surgeon: Robynn Pane, MD;  Location: [facility] CATH LAB;  Service: Cardiovascular;  Laterality: N/A;  . LEFT HEART CATHETERIZATION WITH CORONARY ANGIOGRAM N/A 05/10/2013   Procedure: LEFT HEART CATHETERIZATION WITH CORONARY ANGIOGRAM;  Surgeon: Lesleigh Noe, MD;  Location: [facility] CATH LAB;  Service: Cardiovascular;  Laterality: N/A;  . NO PAST SURGERIES         Medications:  No current facility-administered medications on file prior to encounter.   Current Outpatient Medications on File Prior to Encounter  Medication Sig Dispense Refill  . aspirin 81 MG tablet Take 81 mg by mouth daily.    . cholecalciferol (VITAMIN D) 1000 UNITS tablet Take 1,000 Units by mouth daily.    . Cyanocobalamin (VITAMIN B-12 PO) Take 1 tablet by mouth daily.    . nitroGLYCERIN (NITROSTAT) 0.4  MG SL tablet PLACE 1 TABLET UNDER THE TONGUE EVERY 5 MINUTES AS NEEDED FOR CHEST PAIN 25 tablet 3  . vitamin C (ASCORBIC ACID) 500 MG tablet Take 500 mg by mouth daily.    Marland Kitchen VITAMIN E PO Take 1 capsule by mouth daily.        Social History: No EtOH, drug use or current smoking.    Family History:  Reviewed in Epic.    ROS: No CP, SOB or cough. Other ROS as per HPI.    Anticoagulant use:  No   Antiplatelet use: Yes   Examination:    BP (!) 180/84   Pulse 65   Temp 98.2 F (36.8 C)  (Oral)   Resp 18   Ht 5\' 9"  (1.753 m)   Wt 74.8 kg   SpO2 98%   BMI 24.37 kg/m   Ment: Awake and alert. Oriented x 5. Speech fluent with intact naming and comprehension.  CN: Face is symmetric Motor: 5/5 bilateral upper and lower extremities without asymmetry, including ankle dorsiflexion and plantar flexion Gait: Mildly antalgic gait with foot drop on the left.    1A: Level of Consciousness - 0 1B: Ask Month and Age - 0 1C: Blink Eyes & Squeeze Hands - 0 2: Test Horizontal Extraocular Movements - 0 3: Test Visual Fields - 0 4: Test Facial Palsy (Use Grimace if Obtunded) - 0 5A: Test Left Arm Motor Drift - 0 5B: Test Right Arm Motor Drift - 0 6A: Test Left Leg Motor Drift - 0 6B: Test Right Leg Motor Drift - 0 7: Test Limb Ataxia (FNF/Heel-Shin) - 0 8: Test Sensation -  0 9: Test Language/Aphasia - 0 10: Test Dysarthria - Severe Dysarthria: 0 11: Test Extinction/Inattention - Extinction to bilateral simultaneous stimulation 0   NIHSS Score: 0     Patient/Family was informed the Neurology Consult would occur via TeleHealth consult by way of interactive audio and video telecommunications and consented to receiving care in this manner.   Patient is being evaluated for possible acute neurologic impairment and high pretest probability of imminent or life-threatening deterioration. I spent total of 40 minutes providing care to this patient, including time for face to face visit via telemedicine, review of medical records, imaging studies and discussion of findings with providers, the patient and/or family.   Electronically signed: Dr. 

## 2021-02-15 NOTE — ED Triage Notes (Signed)
States about 1 hour ago patient's left leg was numb and had trouble picking up his leg, states his leg was dragging and his L hip has 'a little bit of pain.' Denies recent falls or trauma. States his normal stride is just off.

## 2021-02-15 NOTE — ED Notes (Signed)
[facility] called for pt transfer to [facility] 

## 2021-02-15 NOTE — ED Notes (Signed)
Patient in MRI 

## 2021-02-16 ENCOUNTER — Inpatient Hospital Stay (HOSPITAL_COMMUNITY): Payer: Medicare Other

## 2021-02-16 ENCOUNTER — Other Ambulatory Visit (HOSPITAL_COMMUNITY): Payer: Medicare Other

## 2021-02-16 DIAGNOSIS — I25118 Atherosclerotic heart disease of native coronary artery with other forms of angina pectoris: Secondary | ICD-10-CM

## 2021-02-16 DIAGNOSIS — E7849 Other hyperlipidemia: Secondary | ICD-10-CM | POA: Diagnosis not present

## 2021-02-16 DIAGNOSIS — I6389 Other cerebral infarction: Secondary | ICD-10-CM | POA: Diagnosis not present

## 2021-02-16 DIAGNOSIS — I639 Cerebral infarction, unspecified: Secondary | ICD-10-CM

## 2021-02-16 DIAGNOSIS — N179 Acute kidney failure, unspecified: Secondary | ICD-10-CM | POA: Diagnosis not present

## 2021-02-16 LAB — LIPID PANEL
Cholesterol: 237 mg/dL — ABNORMAL HIGH (ref 0–200)
HDL: 62 mg/dL (ref >40)
LDL Cholesterol: 156 mg/dL — ABNORMAL HIGH (ref 0–99)
Total CHOL/HDL Ratio: 3.8 RATIO
Triglycerides: 94 mg/dL (ref <150)
VLDL: 19 mg/dL (ref 0–40)

## 2021-02-16 LAB — ECHOCARDIOGRAM COMPLETE
AR max vel: 2.67 cm2
AV Area VTI: 2.51 cm2
AV Area mean vel: 2 cm2
AV Mean grad: 5 mmHg
AV Peak grad: 7.8 mmHg
Ao pk vel: 1.4 m/s
Area-P 1/2: 2.95 cm2
Calc EF: 70.8 %
Height: 69 in
S' Lateral: 1.95 cm
Single Plane A2C EF: 71.4 %
Single Plane A4C EF: 69.1 %
Weight: 2640 oz

## 2021-02-16 LAB — HEMOGLOBIN A1C
Hgb A1c MFr Bld: 5.7 % — ABNORMAL HIGH (ref 4.8–5.6)
Mean Plasma Glucose: 116.89 mg/dL

## 2021-02-16 LAB — CREATININE, SERUM
Creatinine, Ser: 1.3 mg/dL — ABNORMAL HIGH (ref 0.61–1.24)
GFR, Estimated: 55 mL/min — ABNORMAL LOW (ref >60)

## 2021-02-16 LAB — CBC
HCT: 39.7 % (ref 39.0–52.0)
Hemoglobin: 13.2 g/dL (ref 13.0–17.0)
MCH: 30.3 pg (ref 26.0–34.0)
MCHC: 33.2 g/dL (ref 30.0–36.0)
MCV: 91.3 fL (ref 80.0–100.0)
Platelets: 301 10*3/uL (ref 150–400)
RBC: 4.35 MIL/uL (ref 4.22–5.81)
RDW: 12.2 % (ref 11.5–15.5)
WBC: 7.7 10*3/uL (ref 4.0–10.5)
nRBC: 0 % (ref 0.0–0.2)

## 2021-02-16 MED ORDER — ACETAMINOPHEN 160 MG/5ML PO SOLN: 650.0000 mg | ORAL | Status: DC | PRN

## 2021-02-16 MED ORDER — STROKE: EARLY STAGES OF RECOVERY BOOK: Freq: Once | Status: AC

## 2021-02-16 MED ORDER — ACETAMINOPHEN 650 MG RE SUPP: 650.0000 mg | RECTAL | Status: DC | PRN

## 2021-02-16 MED ORDER — ENOXAPARIN SODIUM 40 MG/0.4ML IJ SOSY
40.0000 mg | PREFILLED_SYRINGE | Freq: Every day | INTRAMUSCULAR | Status: DC
  Administered 2021-02-16: 40 mg via SUBCUTANEOUS
  Filled 2021-02-16 (×2): qty 0.4

## 2021-02-16 MED ORDER — STROKE: EARLY STAGES OF RECOVERY BOOK
Freq: Once | Status: AC
  Filled 2021-02-16: qty 1

## 2021-02-16 MED ORDER — SENNOSIDES-DOCUSATE SODIUM 8.6-50 MG PO TABS: 1.0000 | ORAL_TABLET | Freq: Every evening | ORAL | Status: DC | PRN

## 2021-02-16 MED ORDER — SODIUM CHLORIDE 0.9 % IV SOLN: INTRAVENOUS | Status: DC

## 2021-02-16 MED ORDER — CLOPIDOGREL BISULFATE 75 MG PO TABS
300.0000 mg | ORAL_TABLET | Freq: Once | ORAL | Status: AC
  Administered 2021-02-16: 300 mg via ORAL
  Filled 2021-02-16: qty 4

## 2021-02-16 MED ORDER — GADOBUTROL 1 MMOL/ML IV SOLN
7.5000 mL | Freq: Once | INTRAVENOUS | Status: AC | PRN
  Administered 2021-02-16: 7.5 mL via INTRAVENOUS

## 2021-02-16 MED ORDER — ACETAMINOPHEN 325 MG PO TABS: 650.0000 mg | ORAL_TABLET | ORAL | Status: DC | PRN

## 2021-02-16 MED ORDER — ATORVASTATIN CALCIUM 40 MG PO TABS
40.0000 mg | ORAL_TABLET | Freq: Every day | ORAL | Status: DC
  Administered 2021-02-16 – 2021-02-17 (×2): 40 mg via ORAL
  Filled 2021-02-16 (×2): qty 1

## 2021-02-16 MED ORDER — CLOPIDOGREL BISULFATE 75 MG PO TABS
75.0000 mg | ORAL_TABLET | Freq: Every day | ORAL | Status: DC
  Administered 2021-02-16 – 2021-02-17 (×2): 75 mg via ORAL
  Filled 2021-02-16 (×2): qty 1

## 2021-02-16 MED ORDER — ASPIRIN EC 81 MG PO TBEC
81.0000 mg | DELAYED_RELEASE_TABLET | Freq: Every day | ORAL | Status: DC
  Administered 2021-02-16 – 2021-02-17 (×2): 81 mg via ORAL
  Filled 2021-02-16 (×2): qty 1

## 2021-02-16 MED ORDER — ASPIRIN 325 MG PO TABS: 325.0000 mg | ORAL_TABLET | Freq: Every day | ORAL | Status: DC

## 2021-02-16 NOTE — Evaluation (Signed)
Physical Therapy Evaluation Patient Details Name: Philip Parsons MRN: [id number] DOB: 09/09/1941 Today's Date: 02/16/2021   History of Present Illness  80 y.o. male who presents with sudden onset left leg weakness. MRI + Small area of acute ischemia within the right thalamus. past medical history significant for CAD (s/p LAD drug-eluting stent 2013), hyperlipidemia, remote smoking, borderline hypertension, CRVO L eye.  Clinical Impression   Pt presents with mild LLE functional weakness, impaired activity tolerance, impaired higher level balance. Pt to benefit from acute PT to address deficits. Pt ambulated good hallway distance with close guard for safety, and completed climbing flight of steps with x1 LOB requiring Pt assist, L foot catching on step due to decreased DF. Pt scored 19/24 on DGI, pt states he feels close to baseline with mobility but PT feels pt would benefit from OPPT to address higher level balance deficits and functional weakness. PT reviewed BE FAST with pt and wife, both express understanding. PT to progress mobility as tolerated, and will continue to follow acutely.      Follow Up Recommendations Outpatient PT;Supervision for mobility/OOB (neuro)    Equipment Recommendations  None recommended by PT    Recommendations for Other Services       Precautions / Restrictions Precautions Precautions: Fall Restrictions Weight Bearing Restrictions: No      Mobility  Bed Mobility Overal bed mobility: Modified Independent                  Transfers Overall transfer level: Needs assistance   Transfers: Sit to/from Stand Sit to Stand: Supervision         General transfer comment: for safety only  Ambulation/Gait Ambulation/Gait assistance: Min guard Gait Distance (Feet): 300 Feet Assistive device: None Gait Pattern/deviations: Step-through pattern;Decreased stride length;Decreased dorsiflexion - left;Drifts right/left Gait velocity: decr   General Gait  Details: close guard for safety, especially with challenges to pt balance (see balance section). Pt listing L in hallway, with decreased DF with fatigue and challenge  Stairs Stairs: Yes Stairs assistance: Min guard;Min assist Stair Management: One rail Right;Alternating pattern;Forwards Number of Stairs: 20 General stair comments: min guard for safety, x1 period of min assist as pt with decreased DF LLE and tripped, pt correcting by reaching anteriorly for step and PT corrected with posterior pull of gait belt. Pt states "I was going too fast, let me slow down".  Wheelchair Mobility    Modified Rankin (Stroke Patients Only) Modified Rankin (Stroke Patients Only) Pre-Morbid Rankin Score: No symptoms Modified Rankin: Moderate disability     Balance Overall balance assessment: Mild deficits observed, not formally tested                                           Pertinent Vitals/Pain Pain Assessment: No/denies pain    Home Living Family/patient expects to be discharged to:: Private residence Living Arrangements: Spouse/significant other Available Help at Discharge: Available 24 hours/day Type of Home: House Home Access: Stairs to enter Entrance Stairs-Rails: None Entrance Stairs-Number of Steps: 1 Home Layout: Two level Home Equipment: Environmental consultant - 2 wheels;Grab bars - tub/shower      Prior Function Level of Independence: Independent         Comments: drives;does own finances, medication managment     Hand Dominance   Dominant Hand: Right    Extremity/Trunk Assessment   Upper Extremity Assessment Upper Extremity Assessment: Defer  to OT evaluation    Lower Extremity Assessment Lower Extremity Assessment: LLE deficits/detail LLE Deficits / Details: MMT WFL, at least 4/5 all movements and symmetric to R. Functionally, decreased DF with fatigue and challenge (stairs)    Cervical / Trunk Assessment Cervical / Trunk Assessment: Normal   Communication   Communication: No difficulties  Cognition Arousal/Alertness: Awake/alert Behavior During Therapy: WFL for tasks assessed/performed Overall Cognitive Status: Within Functional Limits for tasks assessed                                        General Comments General comments (skin integrity, edema, etc.): reviewed BE FAST - pt with no recall of OT going over this acronym    Exercises     Assessment/Plan    PT Assessment Patient needs continued PT services  PT Problem List Decreased strength;Decreased mobility;Decreased activity tolerance;Decreased balance       PT Treatment Interventions Therapeutic activities;DME instruction;Gait training;Therapeutic exercise;Patient/family education;Balance training;Functional mobility training;Stair training;Neuromuscular re-education    PT Goals (Current goals can be found in the Care Plan section)  Acute Rehab PT Goals Patient Stated Goal: d/c home, get some sleep PT Goal Formulation: With patient Time For Goal Achievement: 03/02/21 Potential to Achieve Goals: Good    Frequency Min 4X/week   Barriers to discharge        Co-evaluation               AM-PAC PT "6 Clicks" Mobility  Outcome Measure Help needed turning from your back to your side while in a flat bed without using bedrails?: None Help needed moving from lying on your back to sitting on the side of a flat bed without using bedrails?: None Help needed moving to and from a bed to a chair (including a wheelchair)?: None Help needed standing up from a chair using your arms (e.g., wheelchair or bedside chair)?: None Help needed to walk in hospital room?: A Little Help needed climbing 3-5 steps with a railing? : A Little 6 Click Score: 22    End of Session Equipment Utilized During Treatment: Gait belt Activity Tolerance: Patient tolerated treatment well;Patient limited by fatigue Patient left: in bed;with call bell/phone within  reach;with bed alarm set;with family/visitor present Nurse Communication: Mobility status PT Visit Diagnosis: Other abnormalities of gait and mobility (R26.89);Difficulty in walking, not elsewhere classified (R26.2)    Time: 1210-1230 PT Time Calculation (min) (ACUTE ONLY): 20 min   Charges:   PT Evaluation $PT Eval Low Complexity: 1 Low          Noella Kipnis S, PT DPT Acute Rehabilitation Services Pager [phone]  Office [phone]   Tyrone Apple E Christain [place] 02/16/2021, 3:06 PM

## 2021-02-16 NOTE — Plan of Care (Signed)
  Problem: Clinical Measurements: Goal: Ability to maintain clinical measurements within normal limits will improve Outcome: Progressing Goal: Will remain free from infection Outcome: Progressing Goal: Diagnostic test results will improve Outcome: Progressing Goal: Respiratory complications will improve Outcome: Progressing Goal: Cardiovascular complication will be avoided Outcome: Progressing   Problem: Coping: Goal: Level of anxiety will decrease Outcome: Progressing   Problem: Safety: Goal: Ability to remain free from injury will improve Outcome: Progressing   

## 2021-02-16 NOTE — Progress Notes (Signed)
Occupational Therapy Evaluation Patient Details Name: Philip Parsons MRN: [id number] DOB: [birth year] Today's Date: 02/16/2021    History of Present Illness 80 y.o. male who presents with sudden onset left leg weakness. MRI + Small area of acute ischemia within the right thalamus. past medical history significant for CAD (s/p LAD drug-eluting stent 2013), hyperlipidemia, remote smoking, borderline hypertension, CRVO L eye.   Clinical Impression   PTA pt independent with all ADL, IADL tasks and mobility, including driving. 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 Pt demonstrates primarily LLE sensory motor impairment, demonstrating an occasional foot drag when ambulating, which worsens as pt is distracted. Will follow acutely to facilitate safe DC home.     Follow Up Recommendations  No OT follow up;Supervision - Intermittent (S with financial and medication management initially)    Equipment Recommendations  None recommended by OT    Recommendations for Other Services PT consult     Precautions / Restrictions Precautions Precautions: Fall      Mobility Bed Mobility Overal bed mobility: Modified Independent                  Transfers Overall transfer level: Modified independent                    Balance Overall balance assessment: Mild deficits observed, not formally tested                                         ADL either performed or assessed with clinical judgement   ADL Overall ADL's : At baseline (recommend supervision for medication and financial  management initially)                                             Vision Baseline Vision/History: Wears glasses Wears Glasses: Reading only Vision Assessment?: Yes Eye Alignment: Within Functional Limits Ocular Range of Motion: Within Functional Limits Alignment/Gaze Preference: Within Defined Limits Tracking/Visual Pursuits: Able to track stimulus in all quads  without difficulty Saccades: Within functional limits Convergence: Within functional limits Visual Fields: Other (comment) (L field impaired at baseline) Additional Comments: pt states no changes in vision     Perception Perception Perception Tested?: Yes Comments: WFL   Praxis Praxis Praxis tested?: Within functional limits    Pertinent Vitals/Pain Pain Assessment: No/denies pain     Hand Dominance Right   Extremity/Trunk Assessment Upper Extremity Assessment Upper Extremity Assessment: Overall WFL for tasks assessed (deficits with R shoulder at baseline but functional. Pt states this is from an old injury when working fpr the [employer])   Lower Extremity Assessment Lower Extremity Assessment: Defer to PT evaluation (apparent sensory motor deficits)   Cervical / Trunk Assessment Cervical / Trunk Assessment: Normal   Communication Communication Communication: No difficulties   Cognition Arousal/Alertness: Awake/alert Behavior During Therapy: WFL for tasks assessed/performed Overall Cognitive Status: Within Functional Limits for tasks assessed                                 General Comments: Assessed with the Short Blessed Test of Memory and Concentration. PT scored a 4, indicating cognition WFL. Pt had difficulty with recall, however he states he did not sleep last  night.   General Comments  Educated on signs/symptoms of stroke using BeFast    Exercises Exercises: Other exercises (sit - stand x 4 with L leg posterior to work on controlled eccentric contractions)   Shoulder Instructions      Home Living Family/patient expects to be discharged to:: Private residence Living Arrangements: Spouse/significant other Available Help at Discharge: Available 24 hours/day Type of Home: House Home Access: Stairs to enter Secretary/administrator of Steps: 1 Entrance Stairs-Rails: None Home Layout: Two level Alternate Level Stairs-Number of Steps: flight Alternate  Level Stairs-Rails: Right Bathroom Shower/Tub: Walk-in shower;Tub/shower unit   Bathroom Toilet: Standard Bathroom Accessibility: Yes How Accessible: Accessible via walker Home Equipment: Walker - 2 wheels;Grab bars - tub/shower          Prior Functioning/Environment Level of Independence: Independent        Comments: drives;does     own finances, medication managment        OT Problem List: Decreased strength;Impaired balance (sitting and/or standing);Decreased coordination;Decreased safety awareness      OT Treatment/Interventions: Self-care/ADL training;Neuromuscular education;DME and/or AE instruction;Therapeutic activities;Cognitive remediation/compensation;Patient/family education    OT Goals(Current goals can be found in the care plan section) Acute Rehab OT Goals Patient Stated Goal: to not have another stroke OT Goal Formulation: All assessment and education complete, DC therapy Time For Goal Achievement: 03/02/21 Potential to Achieve Goals: Good  OT Frequency: Min 2X/week   Barriers to D/C:            Co-evaluation              AM-PAC OT "6 Clicks" Daily Activity     Outcome Measure Help from another person eating meals?: None Help from another person taking care of personal grooming?: None Help from another person toileting, which includes using toliet, bedpan, or urinal?: None Help from another person bathing (including washing, rinsing, drying)?: None Help from another person to put on and taking off regular upper body clothing?: None Help from another person to put on and taking off regular lower body clothing?: None 6 Click Score: 24   End of Session Nurse Communication: Mobility status  Activity Tolerance: Patient tolerated treatment well Patient left: in bed;with call bell/phone within reach  OT Visit Diagnosis: Other abnormalities of gait and mobility (R26.89);Muscle weakness (generalized) (M62.81)                Time: 1610-9604 OT Time  Calculation (min): 33 min Charges:  OT General Charges $OT Visit: 1 Visit OT Evaluation $OT Eval Moderate Complexity: 1 Mod OT Treatments $Self Care/Home Management : 8-22 mins  Luisa Dago, OT/L   Acute OT Clinical Specialist Acute Rehabilitation Services Pager [phone] Office [phone]   [facility] 02/16/2021, 9:59 AM

## 2021-02-16 NOTE — Consult Note (Addendum)
Neurology Consultation Reason for Consult: Left leg weakness Requesting Physician: Earlie Lou  CC: Left leg weakness  History is obtained from: Patient and chart review  HPI: Philip Parsons is a 80 y.o. male with a past medical history significant for CAD (s/p LAD drug-eluting stent 2013), hyperlipidemia, remote smoking (2.5 pack years while in middle school/high school), borderline hypertension, CRVO, who presents with sudden onset left leg weakness.  He reports he was sitting down for lunch when he was last known well around noon and when he got up from the table to walk he noticed he was dragging his left leg.  He denies having had any pain but he just was not moving as he should be able to.  He came to the ED for evaluation but was outside the tPA window at that point.  Dr. Otelia Limes evaluated via teleneurology and recommended MRI brain, C-spine and L-spine, which revealed an acute right thalamic stroke.  Notably his cardiologist reports that the patient is not willing to take medications for hyperlipidemia and additionally declined medications for hypertension with systolic blood pressure in the 140s.  However in conversation with me today the patient reports he was unaware that he has high cholesterol and he is willing to take medications for this condition.  He does attribute his high blood pressure in the hospital to the stress of acute hospitalization.  However he additionally notes that when he developed his CRVO his ophthalmologist told him that aspirin could make his condition worse and therefore he changed from taking it daily to taking it once a week.  He most recently has seen ophthalmologist Laverna Peace for ranibizumab injections for his left eye.  He does retain some patchy vision in the left eye but it is quite poor compared to his right eye vision.  Additionally he is unvaccinated against COVID-19.  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  ROS: All other review of systems was negative except as noted in the HPI.   Past Medical History:  Diagnosis Date  . Coronary artery disease   . History of intravascular stent placement   . No pertinent past medical history    Past Surgical History:  Procedure Laterality Date  . LEFT HEART CATH N/A 02/02/2012   Procedure: LEFT HEART CATH;  Surgeon: Robynn Pane, MD;  Location: [facility] CATH LAB;  Service: Cardiovascular;  Laterality: N/A;  . LEFT HEART CATHETERIZATION WITH CORONARY ANGIOGRAM N/A 05/10/2013   Procedure: LEFT HEART CATHETERIZATION WITH CORONARY ANGIOGRAM;  Surgeon: Lesleigh Noe, MD;  Location[facility] C[facility] LAB;  Service: Cardiovascular;  Laterality: N/A;  . NO PAST SURGERIES     Current Outpatient Medications  Medication Instructions  . Ascorbic Acid (VITAMIN C PO) 1 tablet, Oral, Daily  . aspirin EC 81 mg, Oral, Weekly, Swallow whole.  . Coenzyme Q10 (COQ10 PO) 1 capsule, Oral, Daily  . latanoprost (XALATAN) 0.005 % ophthalmic solution 1 drop, Left Eye, Daily at bedtime  . MAGNESIUM PO 1 tablet, Oral, Daily  . nitroGLYCERIN (NITROSTAT) 0.4 MG SL tablet PLACE 1 TABLET UNDER THE TONGUE EVERY 5 MINUTES AS NEEDED FOR CHEST PAIN  . VITAMIN D PO 1 tablet, Oral, Daily    Family History  Problem Relation Age of Onset  . Arthritis Mother   . Healthy Father   .  Heart disease Sister    Past Surgical History:  Procedure Laterality Date  . LEFT HEART CATH N/A 02/02/2012   Procedure: LEFT HEART CATH;  Surgeon: Robynn Pane, MD;  Location: [facility] CATH LAB;  Service: Cardiovascular;  Laterality: N/A;  . LEFT HEART CATHETERIZATION WITH CORONARY ANGIOGRAM N/A 05/10/2013   Procedure: LEFT HEART CATHETERIZATION WITH CORONARY ANGIOGRAM;  Surgeon: Lesleigh Noe, MD;  Location: [facility] CATH LAB;  Service: Cardiovascular;  Laterality: N/A;  . NO PAST SURGERIES     Current Meds  Medication  Sig  . Ascorbic Acid (VITAMIN C PO) Take 1 tablet by mouth daily.  Marland Kitchen aspirin EC 81 MG tablet Take 81 mg by mouth once a week. Swallow whole.  . Coenzyme Q10 (COQ10 PO) Take 1 capsule by mouth daily.  Marland Kitchen latanoprost (XALATAN) 0.005 % ophthalmic solution Place 1 drop into the left eye at bedtime.  Marland Kitchen MAGNESIUM PO Take 1 tablet by mouth daily.  . nitroGLYCERIN (NITROSTAT) 0.4 MG SL tablet PLACE 1 TABLET UNDER THE TONGUE EVERY 5 MINUTES AS NEEDED FOR CHEST PAIN (Patient taking differently: Place 0.4 mg under the tongue every 5 (five) minutes as needed for chest pain.)  . VITAMIN D PO Take 1 tablet by mouth daily.    Social History:  reports that he has quit smoking. He has never used smokeless tobacco. He reports that he does not drink alcohol and does not use drugs.   Exam: Current vital signs: BP (!) 168/73 (BP Location: Left Arm)   Pulse (!) 54   Temp 97.9 F (36.6 C) (Oral)   Resp 17   Ht 5\' 9"  (1.753 m)   Wt 74.8 kg   SpO2 99%   BMI 24.37 kg/m  Vital signs in last 24 hours: Temp:  [97.9 F (36.6 C)-98.2 F (36.8 C)] 97.9 F (36.6 C) (05/22 2232) Pulse Rate:  [54-66] 54 (05/22 2232) Resp:  [13-21] 17 (05/22 2232) BP: (130-180)/(62-107) 168/73 (05/22 2232) SpO2:  [98 %-100 %] 99 % (05/22 2232) Weight:  [74.8 kg] 74.8 kg (05/22 1634)   Physical Exam  Constitutional: Appears well-developed and well-nourished.  Psych: Affect appropriate to situation, pleasant and cooperative Eyes: No scleral injection HENT: No oropharyngeal obstruction.  Good dentition MSK: no joint deformities.  Cardiovascular: Normal rate and regular rhythm.  Respiratory: Effort normal, non-labored breathing GI: Soft.  No distension. There is no tenderness.  Skin: Warm dry and intact visible skin  Neuro: Mental Status: Patient is awake, alert, oriented to person, place, month, year, and situation. Patient is able to give a clear and coherent history. No signs of aphasia or neglect Cranial Nerves: II:  Visual Fields are full. Pupils are equal, round, and reactive to light.  He does have a left eye afferent pupillary defect III,IV, VI: EOMI without ptosis or diploplia, though he appears to have some mild proptosis.  V: Facial sensation is symmetric to light touch VII: Facial movement is symmetric.  VIII: hearing is intact to voice X: Uvula elevates symmetrically XI: Shoulder shrug is symmetric. XII: tongue is midline without atrophy or fasciculations.  Motor: Tone is normal. Bulk is normal. 5/5 strength was present in all four extremities.  Sensory:` Sensation is symmetric to light touch and temperature in the arms and legs. Deep Tendon Reflexes: 2+ and symmetric in the biceps and patellae.  Plantars: Toes are downgoing bilaterally.  Cerebellar: FNF and HKS are intact bilaterally Gait: Able to rise on heels and toes.  Steady casual gait, normal base.  Able to tandem  NIHSS total 0  I have reviewed labs in epic and the results pertinent to this consultation are:   Basic Metabolic Panel: Recent Labs  Lab 02/15/21 1706 02/15/21 1725  NA 141 142  K 3.7 3.6  CL 107 106  CO2 29  --   GLUCOSE 81 74  BUN 25* 25*  CREATININE 1.45* 1.40*  CALCIUM 8.7*  --     CBC: Recent Labs  Lab 02/15/21 1706 02/15/21 1725  WBC 8.1  --   NEUTROABS 4.1  --   HGB 12.9* 12.9*  HCT 39.9 38.0*  MCV 94.8  --   PLT 294  --     Coagulation Studies: Recent Labs    02/15/21 1706  LABPROT 13.0  INR 1.0      Lab Results  Component Value Date   CHOL 235 (H) 07/30/2019   HDL 59 07/30/2019   LDLCALC 154 (H) 07/30/2019   LDLDIRECT 152.5 10/15/2013   TRIG 123 07/30/2019   CHOLHDL 4.0 07/30/2019   No results found for: HGBA1C  I have reviewed the images obtained: MRI brain with acute right thalamic stroke reviewed with patient MRI cervical spine and lumbar spine without acute process, mild degenerative changes without significant stenoses in the canal or foramina  Impression: This  is an 80 year old male with past medical history significant for coronary artery disease, borderline hypertension, untreated hyperlipidemia, very remote minor smoking, presenting with an acute neurological change.  Clinical course is consistent with a stuttering lacunar stroke.  It is a good prognostic sign this patient is back to baseline though possibility of interval worsening is present.  On a brief review of literature some ophthalmologist's do avoid antiplatelet agents in the setting of CRVO and therefore would obtain clearance from ophthalmology given he still does have some vision in the left eye and is undergoing treatments for vision preservation.  Ideally would like to start DAPT, but at least would want to consider aspirin monotherapy  Recommendations:  #Right thalamic lacunar stroke - Please reach out to Laverna Peace, [facility] [phone] for clearance to start aspirin 81 mg daily, ideally dual antiplatelet therapy for 21 to 90-day course pending vessel imaging.  Please discuss patient may also need anticoagulation if atrial fibrillation is captured on telemetry to determine if this would be absolutely contraindicated - Stroke labs HgbA1c, fasting lipid panel - MRA of the brain without contrast and MRA neck w/wo  - Frequent neuro checks - Echocardiogram - Holding antiplatelet agent until cleared by ophthalmology  -If cleared for dual antiplatelet therapy, aspirin 81 mg daily and Plavix 300 mg load with 75 mg daily for 21 - 90 day course  - Risk factor modification, diet, exercise, medication adherence - Telemetry monitoring;  - Blood pressure goal   - Normotension, to be achieved gradually - Stroke team to follow  Brooke Dare MD-PhD [facility] [phone] Available 7 PM to 7 AM, outside of these hours please call Neurologist on call as listed on Amion.

## 2021-02-16 NOTE — Progress Notes (Signed)
PROGRESS NOTE  CHAPMAN TRETTIN TKW:[id number] DOB: 1940/10/01 DOA: 02/15/2021 PCP: Blair Heys, MD   LOS: 1 day   Brief narrative: Philip Parsons is a 80 y.o. male with medical history significant of coronary artery disease, hypertension, gout, hyperlipidemia was brought into the hospital with sudden onset of left-sided weakness while he was sitting at the dining table talking to his family members.  Patient presented to the ED outside the tPA window.  Initial CAT scan was negative.  Consulted and recommended admission and work-up.  EEG showed normal sinus rhythm.  Chest x-ray was negative.   Assessment/Plan:  Principal Problem:   Acute CVA (cerebrovascular accident) [facility]) Active Problems:   Coronary atherosclerosis of native coronary artery   Hyperlipidemia   AKI (acute kidney injury) (HCC)  Acute CVA:  Presented with extremity weakness.  CT head with no acute abnormality but chronic microvascular changes.  MRI of the brain showed small area of acute ischemia in the right thalamic area.   Continue aspirin and statin.  Await recommendations of neurology.  MRI of the cervical spine showed mild degenerative changes without spinal canal stenosis.  Lumbar spine MRI also showed degenerative changes.  But no large vessel intracranial occlusion and suspected severe left distal P2 PCA stenosis.  Moderate stenosis of the mid basilar artery.  Spoke with neurology.  We will continue with aspirin and Plavix and as per neurology we will do Plavix load as well.  Follow PT OT recommendations for  History of retinal ischemia.  Spoke with patient's primary ophthalmologist today.  She recommended aspirin and Plavix for stroke.  Communicated this with neurology team.  We will start the patient on aspirin and Plavix.  Hyperlipidemia:  Continue Lipitor.  Coronary artery disease: Stable.    Continue aspirin and Lipitor.  Hyper tension:  Currently controlled.  Not on any medications.  Elevated creatinine.   Possible mild AKI.  IV fluid hydration.  We will continue. Check level in am.  DVT prophylaxis: enoxaparin (LOVENOX) injection 40 mg Start: 02/16/21 1000  Code Status:  Full   Family Communication: None  Status is: Inpatient  Remains inpatient appropriate because:IV treatments appropriate due to intensity of illness or inability to take PO and Inpatient level of care appropriate due to severity of illness   Dispo: The patient is from: Home              Anticipated d/c is to: undetermined, will need PT evaluation.              Patient currently is not medically stable to d/c.   Difficult to place patient No   Consultants:  Neurology  Procedures:  MRI of the brain MRA of the head and neck  Anti-infectives:  . None  Anti-infectives (From admission, onward)   None     Subjective: Today, patient was seen and examined at bedside.  Patient states that he feels okay today.  Mild left leg weakness  Objective: Vitals:   02/16/21 0411 02/16/21 0814  BP: (!) 143/75 (!) 148/77  Pulse: 60 (!) 59  Resp: 17 20  Temp: 97.9 F (36.6 C) 98 F (36.7 C)  SpO2: 97% 97%   No intake or output data in the 24 hours ending 02/16/21 1345 Filed Weights   02/15/21 1634  Weight: 74.8 kg   Body mass index is 24.37 kg/m.   Physical Exam: GENERAL: Patient is alert awake and oriented. Not in obvious distress. HENT: No scleral pallor or icterus. Pupils equally reactive  to light. Oral mucosa is moist NECK: is supple, no gross swelling noted. CHEST: Clear to auscultation. No crackles or wheezes.  Diminished breath sounds bilaterally. CVS: S1 and S2 heard, no murmur. Regular rate and rhythm.  ABDOMEN: Soft, non-tender, bowel sounds are present. EXTREMITIES: No edema. CNS: Cranial nerves are intact.  Mild left lower extremity weakness.  Did not test gait SKIN: warm and dry without rashes.  Data Review: I have personally reviewed the following laboratory data and studies,  CBC: Recent  Labs  Lab March 05, 2021 1706 March 05, 2021 1725 02/16/21 0557  WBC 8.1  --  7.7  NEUTROABS 4.1  --   --   HGB 12.9* 12.9* 13.2  HCT 39.9 38.0* 39.7  MCV 94.8  --  91.3  PLT 294  --  301   Basic Metabolic Panel: Recent Labs  Lab 2021-03-05 1706 03/05/21 1725 02/16/21 0557  NA 141 142  --   K 3.7 3.6  --   CL 107 106  --   CO2 29  --   --   GLUCOSE 81 74  --   BUN 25* 25*  --   CREATININE 1.45* 1.40* 1.30*  CALCIUM 8.7*  --   --    Liver Function Tests: Recent Labs  Lab 03/05/21 1706  AST 19  ALT 11  ALKPHOS 51  BILITOT 0.5  PROT 7.5  ALBUMIN 3.8   No results for input(s): LIPASE, AMYLASE in the last 168 hours. No results for input(s): AMMONIA in the last 168 hours. Cardiac Enzymes: No results for input(s): CKTOTAL, CKMB, CKMBINDEX, TROPONINI in the last 168 hours. BNP (last 3 results) No results for input(s): BNP in the last 8760 hours.  ProBNP (last 3 results) No results for input(s): PROBNP in the last 8760 hours.  CBG: Recent Labs  Lab 2021/03/05 1720  GLUCAP 68*   Recent Results (from the past 240 hour(s))  Resp Panel by RT-PCR (Flu A&B, Covid) Nasopharyngeal Swab     Status: None   Collection Time: 03/05/21  5:06 PM   Specimen: Nasopharyngeal Swab; Nasopharyngeal(NP) swabs in vial transport medium  Result Value Ref Range Status   SARS Coronavirus 2 by RT PCR NEGATIVE NEGATIVE Final    Comment: (NOTE) SARS-CoV-2 target nucleic acids are NOT DETECTED.  The SARS-CoV-2 RNA is generally detectable in upper respiratory specimens during the acute phase of infection. The lowest concentration of SARS-CoV-2 viral copies this assay can detect is 138 copies/mL. A negative result does not preclude SARS-Cov-2 infection and should not be used as the sole basis for treatment or other patient management decisions. A negative result may occur with  improper specimen collection/handling, submission of specimen other than nasopharyngeal swab, presence of viral mutation(s)  within the areas targeted by this assay, and inadequate number of viral copies(<138 copies/mL). A negative result must be combined with clinical observations, patient history, and epidemiological information. The expected result is Negative.  Fact Sheet for Patients:  BloggerCourse.com  Fact Sheet for Healthcare Providers:  SeriousBroker.it  This test is no t yet approved or cleared by the [place] FDA and  has been authorized for detection and/or diagnosis of SARS-CoV-2 by FDA under an Emergency Use Authorization (EUA). This EUA will remain  in effect (meaning this test can be used) for the duration of the COVID-19 declaration under Section 564(b)(1) of the Act, 21 U.S.C.section 360bbb-3(b)(1), unless the authorization is terminated  or revoked sooner.       Influenza A by PCR NEGATIVE NEGATIVE Final  Influenza B by PCR NEGATIVE NEGATIVE Final    Comment: (NOTE) The Xpert Xpress SARS-CoV-2/FLU/RSV plus assay is intended as an aid in the diagnosis of influenza from Nasopharyngeal swab specimens and should not be used as a sole basis for treatment. Nasal washings and aspirates are unacceptable for Xpert Xpress SARS-CoV-2/FLU/RSV testing.  Fact Sheet for Patients: BloggerCourse.com  Fact Sheet for Healthcare Providers: SeriousBroker.it  This test is not yet approved or cleared by the [place] FDA and has been authorized for detection and/or diagnosis of SARS-CoV-2 by FDA under an Emergency Use Authorization (EUA). This EUA will remain in effect (meaning this test can be used) for the duration of the COVID-19 declaration under Section 564(b)(1) of the Act, 21 U.S.C. section 360bbb-3(b)(1), unless the authorization is terminated or revoked.  Performed at [facility], [address], [place], [place] [address]      Studies: MR ANGIO HEAD WO  CONTRAST  Result Date: 02/16/2021 CLINICAL DATA:  Neuro deficit, acute stroke suspected. EXAM: MRA NECK WITHOUT AND WITH CONTRAST MRA HEAD WITHOUT CONTRAST TECHNIQUE: Multiplanar and multiecho pulse sequences of the neck were obtained without and with intravenous contrast. Angiographic images of the neck were obtained using MRA technique without and with intravenous contrast; Angiographic images of the Circle of Willis were obtained using MRA technique without intravenous contrast. CONTRAST:  7.18mL GADAVIST GADOBUTROL 1 MMOL/ML IV SOLN COMPARISON:  MRI head Feb 15, 2021. FINDINGS: Mildly motion limited exam in the head and neck. MRA NECK FINDINGS Great vessel origins are patent. Bilateral common carotid and internal carotid arteries are patent. Mild narrowing at the carotid bifurcations without greater than 50% stenosis. Codominant vertebral arteries which are patent without evidence of significant (greater than 50%) stenosis. MRA HEAD FINDINGS Anterior circulation: No large vessel occlusion, proximal hemodynamically significant stenosis or visible aneurysm. Posterior circulation: No large vessel occlusion. Irregular basilar artery, likely related to atherosclerosis. Mild to moderate (approximately 30-40%)a stenosis of the mid basilar artery. Bilateral posterior cerebral arteries are patent. Suspected severe stenosis of the distal left P2 PCA. IMPRESSION: MRA Headl: 1. No large vessel intracranial occlusion. 2. Suspected severe left distal P2 PCA stenosis. 3. Mild to moderate (approximately 30-40%) stenosis of the mid basilar artery. MRA Neck: No visible hemodynamically significant stenosis. Electronically Signed   By: Feliberto Harts MD   On: 02/16/2021 06:46   MR ANGIO NECK W WO CONTRAST  Result Date: 02/16/2021 CLINICAL DATA:  Neuro deficit, acute stroke suspected. EXAM: MRA NECK WITHOUT AND WITH CONTRAST MRA HEAD WITHOUT CONTRAST TECHNIQUE: Multiplanar and multiecho pulse sequences of the neck were  obtained without and with intravenous contrast. Angiographic images of the neck were obtained using MRA technique without and with intravenous contrast; Angiographic images of the Circle of Willis were obtained using MRA technique without intravenous contrast. CONTRAST:  7.38mL GADAVIST GADOBUTROL 1 MMOL/ML IV SOLN COMPARISON:  MRI head Feb 15, 2021. FINDINGS: Mildly motion limited exam in the head and neck. MRA NECK FINDINGS Great vessel origins are patent. Bilateral common carotid and internal carotid arteries are patent. Mild narrowing at the carotid bifurcations without greater than 50% stenosis. Codominant vertebral arteries which are patent without evidence of significant (greater than 50%) stenosis. MRA HEAD FINDINGS Anterior circulation: No large vessel occlusion, proximal hemodynamically significant stenosis or visible aneurysm. Posterior circulation: No large vessel occlusion. Irregular basilar artery, likely related to atherosclerosis. Mild to moderate (approximately 30-40%)a stenosis of the mid basilar artery. Bilateral posterior cerebral arteries are patent. Suspected severe stenosis of the  distal left P2 PCA. IMPRESSION: MRA Headl: 1. No large vessel intracranial occlusion. 2. Suspected severe left distal P2 PCA stenosis. 3. Mild to moderate (approximately 30-40%) stenosis of the mid basilar artery. MRA Neck: No visible hemodynamically significant stenosis. Electronically Signed   By: Feliberto HartsFrederick S Jones MD   On: 02/16/2021 06:46   MR BRAIN WO CONTRAST  Result Date: 02/15/2021 CLINICAL DATA:  Foot drop and left lower extremity weakness EXAM: MRI HEAD WITHOUT CONTRAST TECHNIQUE: Multiplanar, multiecho pulse sequences of the brain and surrounding structures were obtained without intravenous contrast. COMPARISON:  None. FINDINGS: Brain: Small area of abnormal diffusion restriction within the right thalamus. No acute or chronic hemorrhage. There is multifocal hyperintense T2-weighted signal within the  white matter. Generalized volume loss without a clear lobar predilection. The midline structures are normal. Vascular: Major flow voids are preserved. Skull and upper cervical spine: Normal calvarium and skull base. Visualized upper cervical spine and soft tissues are normal. Sinuses/Orbits:No paranasal sinus fluid levels or advanced mucosal thickening. No mastoid or middle ear effusion. Normal orbits. IMPRESSION: 1. Small area of acute ischemia within the right thalamus. No hemorrhage or mass effect. 2. Findings of chronic small vessel disease and volume loss. Electronically Signed   By: Deatra RobinsonKevin  Herman M.D.   On: 02/15/2021 21:25   MR Cervical Spine Wo Contrast  Result Date: 02/15/2021 CLINICAL DATA:  Left lower extremity weakness EXAM: MRI CERVICAL SPINE WITHOUT CONTRAST TECHNIQUE: Multiplanar, multisequence MR imaging of the cervical spine was performed. No intravenous contrast was administered. COMPARISON:  None. FINDINGS: Alignment: Physiologic. Vertebrae: No fracture, evidence of discitis, or bone lesion. Cord: Normal signal and morphology. Posterior Fossa, vertebral arteries, paraspinal tissues: Negative. Disc levels: C1-2: Unremarkable. C2-3: Normal disc space and facet joints. There is no spinal canal stenosis. No neural foraminal stenosis. C3-4: Normal disc space and facet joints. There is no spinal canal stenosis. No neural foraminal stenosis. C4-5: Small disc bulge with uncovertebral hypertrophy. There is no spinal canal stenosis. No neural foraminal stenosis. C5-6: Small disc bulge. There is no spinal canal stenosis. No neural foraminal stenosis. C6-7: Normal disc space and facet joints. There is no spinal canal stenosis. No neural foraminal stenosis. C7-T1: Normal disc space and facet joints. There is no spinal canal stenosis. No neural foraminal stenosis. IMPRESSION: Mild cervical degenerative disc disease without spinal canal or neural foraminal stenosis. Electronically Signed   By: Deatra RobinsonKevin  Herman  M.D.   On: 02/15/2021 21:29   MR LUMBAR SPINE WO CONTRAST  Result Date: 02/15/2021 CLINICAL DATA:  Left lower extremity weakness EXAM: MRI LUMBAR SPINE WITHOUT CONTRAST TECHNIQUE: Multiplanar, multisequence MR imaging of the lumbar spine was performed. No intravenous contrast was administered. COMPARISON:  None. FINDINGS: Segmentation:  Standard. Alignment:  Physiologic. Vertebrae:  No fracture, evidence of discitis, or bone lesion. Conus medullaris and cauda equina: Conus extends to the L1 level. Conus and cauda equina appear normal. Paraspinal and other soft tissues: 2.4 cm left renal cyst. Disc levels: T12-L1: Normal disc space and facets. No spinal canal or neuroforaminal stenosis. L1-L2: Normal disc space and facets. No spinal canal or neuroforaminal stenosis. L2-L3: Normal disc space and facets. No spinal canal or neuroforaminal stenosis. L3-L4: Normal disc space and facets. No spinal canal or neuroforaminal stenosis. L4-L5: Normal disc space and facets. No spinal canal or neuroforaminal stenosis. L5-S1: Mild facet hypertrophy and small central disc protrusion. No spinal canal or neural foraminal stenosis. Visualized sacrum: Normal. IMPRESSION: Mild lower lumbar degenerative disc disease without spinal canal or neural foraminal  stenosis. Electronically Signed   By: Deatra Robinson M.D.   On: 02/15/2021 21:33   CT HEAD CODE STROKE WO CONTRAST  Result Date: 02/15/2021 CLINICAL DATA:  Code stroke. Acute neuro deficit. Left leg heaviness. Slurred speech. EXAM: CT HEAD WITHOUT CONTRAST TECHNIQUE: Contiguous axial images were obtained from the base of the skull through the vertex without intravenous contrast. COMPARISON:  CT head 06/19/2007 FINDINGS: Brain: Progressive atrophy. Marked atrophy of the hippocampus with large mint of the temporal horns bilaterally which has developed in the interval. Mild white matter hypodensity bilaterally is symmetric and has progressed in the interval. Negative for acute  infarct, hemorrhage, mass Vascular: Negative for hyperdense vessel Skull: Negative Sinuses/Orbits: Paranasal sinuses clear. Bilateral cataract extraction Other: None ASPECTS (Alberta Stroke Program Early CT Score) - Ganglionic level infarction (caudate, lentiform nuclei, internal capsule, insula, M1-M3 cortex): 7 - Supraganglionic infarction (M4-M6 cortex): 3 Total score (0-10 with 10 being normal): 10 IMPRESSION: 1. No acute abnormality 2. ASPECTS is 10 3. Progressive atrophy especially in the hippocampus. Correlate with dementia. Bilateral white matter hypodensity most consistent with chronic microvascular ischemia. 4. These results were called by telephone at the time of interpretation on 02/15/2021 at 5:20 pm to provider Pfeiffer , who verbally acknowledged these results. Electronically Signed   By: Marlan Palau M.D.   On: 02/15/2021 17:20      Joycelyn Das, MD  [facility] 02/16/2021  If 7PM-7AM, please contact night-coverage

## 2021-02-16 NOTE — Progress Notes (Signed)
  Echocardiogram 2D Echocardiogram has been performed.  Philip Parsons 02/16/2021, 10:30 AM

## 2021-02-16 NOTE — Progress Notes (Signed)
STROKE TEAM PROGRESS NOTE   INTERVAL HISTORY No acute events  He reports onset of left leg weakness which prompted ED presentation.  He states his weakness is improved slightly today.  Blood pressure-adequately controlled.  No complaints  We discussed his stroke diagnosis, ongoing work up and plan of care. DAPT recommendation discussed. Wife had concerns about DAPT because she took Eliquis and had problems. She reports they were told ASA caused his CRVO.  Evidence basis for recommendation explained. Risk vs. Benefit addressed. Questions answered. They agree to DAPT as recommended.  Vitals:   02/15/21 2208 02/15/21 2232 02/16/21 0411 02/16/21 0814  BP: (!) 130/107 (!) 168/73 (!) 143/75 (!) 148/77  Pulse: (!) 55 (!) 54 60 (!) 59  Resp: 18 17 17 20   Temp:  97.9 F (36.6 C) 97.9 F (36.6 C)   TempSrc:  Oral Oral   SpO2: 98% 99% 97% 97%  Weight:      Height:       CBC:  Recent Labs  Lab 02/15/21 1706 02/15/21 1725 02/16/21 0557  WBC 8.1  --  7.7  NEUTROABS 4.1  --   --   HGB 12.9* 12.9* 13.2  HCT 39.9 38.0* 39.7  MCV 94.8  --  91.3  PLT 294  --  301   Basic Metabolic Panel:  Recent Labs  Lab 02/15/21 1706 02/15/21 1725 02/16/21 0557  NA 141 142  --   K 3.7 3.6  --   CL 107 106  --   CO2 29  --   --   GLUCOSE 81 74  --   BUN 25* 25*  --   CREATININE 1.45* 1.40* 1.30*  CALCIUM 8.7*  --   --    Lipid Panel:  Recent Labs  Lab 02/16/21 0557  CHOL 237*  TRIG 94  HDL 62  CHOLHDL 3.8  VLDL 19  LDLCALC 02/18/21*   HgbA1c:  Recent Labs  Lab 02/16/21 0557  HGBA1C 5.7*   Urine Drug Screen: No results for input(s): LABOPIA, COCAINSCRNUR, LABBENZ, AMPHETMU, THCU, LABBARB in the last 168 hours.  Alcohol Level  Recent Labs  Lab 02/15/21 1719  ETH <10    IMAGING past 24 hours MR ANGIO HEAD WO CONTRAST  Result Date: 02/16/2021 CLINICAL DATA:  Neuro deficit, acute stroke suspected. EXAM: MRA NECK WITHOUT AND WITH CONTRAST MRA HEAD WITHOUT CONTRAST TECHNIQUE:  Multiplanar and multiecho pulse sequences of the neck were obtained without and with intravenous contrast. Angiographic images of the neck were obtained using MRA technique without and with intravenous contrast; Angiographic images of the Circle of Willis were obtained using MRA technique without intravenous contrast. CONTRAST:  7.26mL GADAVIST GADOBUTROL 1 MMOL/ML IV SOLN COMPARISON:  MRI head Feb 15, 2021. FINDINGS: Mildly motion limited exam in the head and neck. MRA NECK FINDINGS Great vessel origins are patent. Bilateral common carotid and internal carotid arteries are patent. Mild narrowing at the carotid bifurcations without greater than 50% stenosis. Codominant vertebral arteries which are patent without evidence of significant (greater than 50%) stenosis. MRA HEAD FINDINGS Anterior circulation: No large vessel occlusion, proximal hemodynamically significant stenosis or visible aneurysm. Posterior circulation: No large vessel occlusion. Irregular basilar artery, likely related to atherosclerosis. Mild to moderate (approximately 30-40%)a stenosis of the mid basilar artery. Bilateral posterior cerebral arteries are patent. Suspected severe stenosis of the distal left P2 PCA. IMPRESSION: MRA Headl: 1. No large vessel intracranial occlusion. 2. Suspected severe left distal P2 PCA stenosis. 3. Mild to moderate (approximately 30-40%) stenosis of  the mid basilar artery. MRA Neck: No visible hemodynamically significant stenosis. Electronically Signed   By: Feliberto Harts MD   On: 02/16/2021 06:46   MR ANGIO NECK W WO CONTRAST  Result Date: 02/16/2021 CLINICAL DATA:  Neuro deficit, acute stroke suspected. EXAM: MRA NECK WITHOUT AND WITH CONTRAST MRA HEAD WITHOUT CONTRAST TECHNIQUE: Multiplanar and multiecho pulse sequences of the neck were obtained without and with intravenous contrast. Angiographic images of the neck were obtained using MRA technique without and with intravenous contrast; Angiographic images  of the Circle of Willis were obtained using MRA technique without intravenous contrast. CONTRAST:  7.53mL GADAVIST GADOBUTROL 1 MMOL/ML IV SOLN COMPARISON:  MRI head Feb 15, 2021. FINDINGS: Mildly motion limited exam in the head and neck. MRA NECK FINDINGS Great vessel origins are patent. Bilateral common carotid and internal carotid arteries are patent. Mild narrowing at the carotid bifurcations without greater than 50% stenosis. Codominant vertebral arteries which are patent without evidence of significant (greater than 50%) stenosis. MRA HEAD FINDINGS Anterior circulation: No large vessel occlusion, proximal hemodynamically significant stenosis or visible aneurysm. Posterior circulation: No large vessel occlusion. Irregular basilar artery, likely related to atherosclerosis. Mild to moderate (approximately 30-40%)a stenosis of the mid basilar artery. Bilateral posterior cerebral arteries are patent. Suspected severe stenosis of the distal left P2 PCA. IMPRESSION: MRA Headl: 1. No large vessel intracranial occlusion. 2. Suspected severe left distal P2 PCA stenosis. 3. Mild to moderate (approximately 30-40%) stenosis of the mid basilar artery. MRA Neck: No visible hemodynamically significant stenosis. Electronically Signed   By: Feliberto Harts MD   On: 02/16/2021 06:46   MR BRAIN WO CONTRAST  Result Date: 02/15/2021 CLINICAL DATA:  Foot drop and left lower extremity weakness EXAM: MRI HEAD WITHOUT CONTRAST TECHNIQUE: Multiplanar, multiecho pulse sequences of the brain and surrounding structures were obtained without intravenous contrast. COMPARISON:  None. FINDINGS: Brain: Small area of abnormal diffusion restriction within the right thalamus. No acute or chronic hemorrhage. There is multifocal hyperintense T2-weighted signal within the white matter. Generalized volume loss without a clear lobar predilection. The midline structures are normal. Vascular: Major flow voids are preserved. Skull and upper cervical  spine: Normal calvarium and skull base. Visualized upper cervical spine and soft tissues are normal. Sinuses/Orbits:No paranasal sinus fluid levels or advanced mucosal thickening. No mastoid or middle ear effusion. Normal orbits. IMPRESSION: 1. Small area of acute ischemia within the right thalamus. No hemorrhage or mass effect. 2. Findings of chronic small vessel disease and volume loss. Electronically Signed   By: Deatra Robinson M.D.   On: 02/15/2021 21:25   MR Cervical Spine Wo Contrast  Result Date: 02/15/2021 CLINICAL DATA:  Left lower extremity weakness EXAM: MRI CERVICAL SPINE WITHOUT CONTRAST TECHNIQUE: Multiplanar, multisequence MR imaging of the cervical spine was performed. No intravenous contrast was administered. COMPARISON:  None. FINDINGS: Alignment: Physiologic. Vertebrae: No fracture, evidence of discitis, or bone lesion. Cord: Normal signal and morphology. Posterior Fossa, vertebral arteries, paraspinal tissues: Negative. Disc levels: C1-2: Unremarkable. C2-3: Normal disc space and facet joints. There is no spinal canal stenosis. No neural foraminal stenosis. C3-4: Normal disc space and facet joints. There is no spinal canal stenosis. No neural foraminal stenosis. C4-5: Small disc bulge with uncovertebral hypertrophy. There is no spinal canal stenosis. No neural foraminal stenosis. C5-6: Small disc bulge. There is no spinal canal stenosis. No neural foraminal stenosis. C6-7: Normal disc space and facet joints. There is no spinal canal stenosis. No neural foraminal stenosis. C7-T1: Normal disc space  and facet joints. There is no spinal canal stenosis. No neural foraminal stenosis. IMPRESSION: Mild cervical degenerative disc disease without spinal canal or neural foraminal stenosis. Electronically Signed   By: Deatra Robinson M.D.   On: 02/15/2021 21:29   MR LUMBAR SPINE WO CONTRAST  Result Date: 02/15/2021 CLINICAL DATA:  Left lower extremity weakness EXAM: MRI LUMBAR SPINE WITHOUT CONTRAST  TECHNIQUE: Multiplanar, multisequence MR imaging of the lumbar spine was performed. No intravenous contrast was administered. COMPARISON:  None. FINDINGS: Segmentation:  Standard. Alignment:  Physiologic. Vertebrae:  No fracture, evidence of discitis, or bone lesion. Conus medullaris and cauda equina: Conus extends to the L1 level. Conus and cauda equina appear normal. Paraspinal and other soft tissues: 2.4 cm left renal cyst. Disc levels: T12-L1: Normal disc space and facets. No spinal canal or neuroforaminal stenosis. L1-L2: Normal disc space and facets. No spinal canal or neuroforaminal stenosis. L2-L3: Normal disc space and facets. No spinal canal or neuroforaminal stenosis. L3-L4: Normal disc space and facets. No spinal canal or neuroforaminal stenosis. L4-L5: Normal disc space and facets. No spinal canal or neuroforaminal stenosis. L5-S1: Mild facet hypertrophy and small central disc protrusion. No spinal canal or neural foraminal stenosis. Visualized sacrum: Normal. IMPRESSION: Mild lower lumbar degenerative disc disease without spinal canal or neural foraminal stenosis. Electronically Signed   By: Deatra Robinson M.D.   On: 02/15/2021 21:33   CT HEAD CODE STROKE WO CONTRAST  Result Date: 02/15/2021 CLINICAL DATA:  Code stroke. Acute neuro deficit. Left leg heaviness. Slurred speech. EXAM: CT HEAD WITHOUT CONTRAST TECHNIQUE: Contiguous axial images were obtained from the base of the skull through the vertex without intravenous contrast. COMPARISON:  CT head 06/19/2007 FINDINGS: Brain: Progressive atrophy. Marked atrophy of the hippocampus with large mint of the temporal horns bilaterally which has developed in the interval. Mild white matter hypodensity bilaterally is symmetric and has progressed in the interval. Negative for acute infarct, hemorrhage, mass Vascular: Negative for hyperdense vessel Skull: Negative Sinuses/Orbits: Paranasal sinuses clear. Bilateral cataract extraction Other: None ASPECTS  (Alberta Stroke Program Early CT Score) - Ganglionic level infarction (caudate, lentiform nuclei, internal capsule, insula, M1-M3 cortex): 7 - Supraganglionic infarction (M4-M6 cortex): 3 Total score (0-10 with 10 being normal): 10 IMPRESSION: 1. No acute abnormality 2. ASPECTS is 10 3. Progressive atrophy especially in the hippocampus. Correlate with dementia. Bilateral white matter hypodensity most consistent with chronic microvascular ischemia. 4. These results were called by telephone at the time of interpretation on 02/15/2021 at 5:20 pm to provider Pfeiffer , who verbally acknowledged these results. Electronically Signed   By: Marlan Palau M.D.   On: 02/15/2021 17:20   PHYSICAL EXAM Constitutional: Pleasant elderly Caucasian male appears well-developed and well-nourished.  Psych: Affect appropriate to situation, pleasant and cooperative Eyes: No scleral injection HENT: No oropharyngeal obstruction.  Good dentition MSK: no joint deformities.  Cardiovascular: Normal rate and regular rhythm.  Respiratory: Effort normal, non-labored breathing GI: Soft.  No distension. There is no tenderness.  Skin: Warm dry and intact visible skin  Neuro: Mental Status: Patient is awake, alert, oriented to person, place, month, year, and situation. Patient is able to give a clear and coherent history. No signs of aphasia or neglect Cranial Nerves: II: Visual Fields are full. Pupils are equal, round, and reactive to light.  He does have a left eye afferent pupillary defect III,IV, VI: EOMI without ptosis or diploplia, though he appears to have some mild proptosis.  V: Facial sensation is symmetric to light touch  VII: Facial movement is symmetric.  VIII: hearing is intact to voice X: Uvula elevates symmetrically XI: Shoulder shrug is symmetric. XII: tongue is midline without atrophy or fasciculations.  Motor: Tone is normal. Bulk is normal. 5/5 strength was present in all four extremities except there  is subtle left lower extremity drift on double simultaneous testing and 4+/5 weakness.  Diminished fine finger movements on the left and orbits right over left upper extremity. Sensory:` Sensation is symmetric to light touch and temperature in the arms and legs. Plantars: Toes are downgoing bilaterally.  Cerebellar: FNF and HKS are intact bilaterally Gait: Able to rise on heels and toes.  Steady casual gait, normal base.  Able to tandem  ASSESSMENT/PLAN Philip Parsons is a 80 y.o. male with a past medical history significant for CAD (s/p LAD drug-eluting stent 2013), hyperlipidemia, remote smoking (2.5 pack years while in middle school/high school), borderline hypertension, CRVO, who presents with sudden onset left leg weakness.  Right thalamic lacunar stroke from small vessel disease  Code Stroke, CT head No acute abnormality. Small vessel disease. Atrophy.    CTA head & neck: No large vessel intracranial occlusion.  MRI: Stroke labs HgbA1c, fasting lipid panel  MRA: Suspected severe left distal P2 PCA stenosis.  2D Echo: Pending  LDL 156  HgbA1c 5.7  VTE prophylaxis - SCDs    Diet   Diet Heart Room service appropriate? Yes; Fluid consistency: Thin   On ASA 81mg  prior to admission per record but evidently was not taking it  DAPT: Aspirin 81mg , Plavix 75mg  x 3 weeks followed by aspirin alone  Therapy recommendations:  Outpatient PT, No OT  Disposition:  Home  Hypertension  Stable . Permissive hypertension (OK if < 220/120) but gradually normalize in 5-7 days . Long-term BP goal normotensive  Hyperlipidemia  Home meds: None     LDL 156, goal < 70   High intensity statin: Lipitor 40mg  daily on board  Continue statin at discharge  Other Stroke Risk Factors  Advanced Age >/= 55   Cigarette smoker: former  associated with increased stroke risk, recommend weight loss, diet and exercise as appropriate   Hx stroke/TIA  Coronary artery disease  Other Active  Problems    Hospital day # 1  I have personally obtained history,examined this patient, reviewed notes, independently viewed imaging studies, participated in medical decision making and plan of care.ROS completed by me personally and pertinent positives fully documented  I have made any additions or clarifications directly to the above note. Agree with note above.  He presented with left leg weakness secondary to right thalamic infarct from small vessel disease.  Continue strict blood pressure monitoring and close neurological monitoring as per post tPA protocol.  Mobilize out of bed.  Therapy consults.  Aspirin and Plavix for 3 weeks followed by aspirin alone.  Continue ongoing stroke work-up.This patient is critically ill and at significant risk of neurological worsening, death and care requires constant monitoring of vital signs, hemodynamics,respiratory and cardiac monitoring, extensive review of multiple databases, frequent neurological assessment, discussion with family, other specialists and medical decision making of high complexity.I have made any additions or clarifications directly to the above note.This critical care time does not reflect procedure time, or teaching time or supervisory time of PA/NP/Med Resident etc but could involve care discussion time.  I spent 30 minutes of neurocritical care time  in the care of  this patient.      , MD Medical Director  Stroke Center Pager: [phone] 02/16/2021 4:53 PM   To contact Stroke Continuity provider, please refer to [email or web address]. After hours, contact General Neurology

## 2021-02-16 NOTE — TOC Initial Note (Signed)
Transition of Care [facility]) - Initial/Assessment Note    Patient Details  Name: Philip Parsons MRN: [id number] Date of Birth: [birth year]  Transition of Care [facility] CM/SW Contact:    Kermit Balo, RN Phone Number: 02/16/2021, 4:03 PM  Clinical Narrative:                 Patient lives at home with his spouse. He states she can provide up to 24 hour supervision. Pt denies issues with transportation. He states he has only been taking vitamins up to this hospitalization.  Pt agreeable to outpatient rehab at [facility]. Orders in Epic and information on the AVS. TOC following.  Expected Discharge Plan: OP Rehab Barriers to Discharge: Continued Medical Work up   Patient Goals and CMS Choice     Choice offered to / list presented to : Patient  Expected Discharge Plan and Services Expected Discharge Plan: OP Rehab   Discharge Planning Services: CM Consult   Living arrangements for the past 2 months: Single Family Home                                      Prior Living Arrangements/Services Living arrangements for the past 2 months: Single Family Home Lives with:: Spouse Patient language and need for interpreter reviewed:: Yes Do you feel safe going back to the place where you live?: Yes        Care giver support system in place?: Yes (comment) Current home services: DME (walker) Criminal Activity/Legal Involvement Pertinent to Current Situation/Hospitalization: No - Comment as needed  Activities of Daily Living      Permission Sought/Granted                  Emotional Assessment Appearance:: Appears stated age Attitude/Demeanor/Rapport: Engaged Affect (typically observed): Accepting Orientation: : Oriented to Self,Oriented to Place,Oriented to  Time,Oriented to Situation   Psych Involvement: No (comment)  Admission diagnosis:  Neurologic gait dysfunction [R26.9] Acute CVA (cerebrovascular accident) (HCC) [I63.9] Weakness of left lower extremity  [R29.898] Patient Active Problem List   Diagnosis Date Noted  . Acute CVA (cerebrovascular accident) (HCC) 02/15/2021  . Gout 02/15/2021  . AKI (acute kidney injury) (HCC) 02/15/2021  . Hyperlipidemia 10/17/2013  . 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 Coronary atherosclerosis of native coronary artery 05/10/2013  . Myocardial infarction, anterolateral wall, acute (HCC) 02/03/2012   PCP:  Blair Heys, MD Pharmacy:   [id number] [place], [place] - [address] [place] [address] Phone: [phone] Fax: [phone]     Social Determinants of Health (SDOH) Interventions    Readmission Risk Interventions No flowsheet data found.

## 2021-02-17 DIAGNOSIS — I25118 Atherosclerotic heart disease of native coronary artery with other forms of angina pectoris: Secondary | ICD-10-CM | POA: Diagnosis not present

## 2021-02-17 DIAGNOSIS — E7849 Other hyperlipidemia: Secondary | ICD-10-CM | POA: Diagnosis not present

## 2021-02-17 DIAGNOSIS — N179 Acute kidney failure, unspecified: Secondary | ICD-10-CM | POA: Diagnosis not present

## 2021-02-17 MED ORDER — CLOPIDOGREL BISULFATE 75 MG PO TABS: 75.0000 mg | ORAL_TABLET | Freq: Every day | ORAL | 0 refills | Status: AC

## 2021-02-17 MED ORDER — ASPIRIN EC 81 MG PO TBEC: 81.0000 mg | DELAYED_RELEASE_TABLET | Freq: Every day | ORAL | 3 refills | Status: AC

## 2021-02-17 MED ORDER — ATORVASTATIN CALCIUM 40 MG PO TABS: 40.0000 mg | ORAL_TABLET | Freq: Every day | ORAL | 3 refills | Status: DC

## 2021-02-17 NOTE — Progress Notes (Signed)
Philip Parsons  D/C'd Home per MD order.  Discussed with the patient and all questions fully answered.  VSS, Skin clean, dry and intact without evidence of skin break down, no evidence of skin tears noted. IV catheter discontinued intact. Site without signs and symptoms of complications. Dressing and pressure applied.  An After Visit Summary was printed and given to the patient. Patient received prescription.  D/c education completed with patient/family including follow up instructions, medication list, d/c activities limitations if indicated, with other d/c instructions as indicated by MD - patient able to verbalize understanding, all questions fully answered.   Patient instructed to return to ED, call 911, or call MD for any changes in condition.   Patient escorted via WC, and D/C home at 1155 via private auto.  Melvenia Needles 02/17/2021 11:53 AM

## 2021-02-17 NOTE — Discharge Summary (Addendum)
Physician Discharge Summary  [name] YQM:[id number] DOB: Dec 21, 1940 DOA: 02/15/2021  PCP: Blair Heys, MD  Admit date: 02/15/2021 Discharge date: 02/17/2021  Admitted From: Home  Discharge disposition: home  Recommendations for Outpatient Follow-Up:   Follow up with your primary care provider in one week.  Check CBC, BMP, magnesium in the next visit Follow-up with [facility] in 3 to 4 weeks.  Discharge Diagnosis:   Principal Problem:   Acute CVA (cerebrovascular accident) [facility]) Active Problems:   Coronary atherosclerosis of native coronary artery   Hyperlipidemia   AKI (acute kidney injury) (HCC)  Discharge Condition: Improved.  Diet recommendation: Low sodium, heart healthy.    Wound care: None.  Code status: Full.   History of Present Illness:   Philip Parsons is a 80 y.o. male with medical history significant of coronary artery disease, hypertension, gout, hyperlipidemia was brought into the hospital with sudden onset of left-sided weakness while he was sitting at the dining table talking to his family members.  Patient presented to the ED outside the tPA window.  Initial CT scan was negative.    Neurology was consulted and recommended admission and work-up.  EKG showed normal sinus rhythm.    Hospital Course:   Following conditions were addressed during hospitalization as listed below,  Acute right thalamic infarct:   Patient presented with extremity weakness.  CT head with no acute abnormality but chronic microvascular changes.  MRI of the brain showed small area of acute ischemia in the right thalamic area. Continue aspirin and statin. MRI of the cervical spine showed mild degenerative changes without spinal canal stenosis.  Lumbar spine MRI also showed degenerative changes but no large vessel intracranial occlusion and suspected severe left distal P2 PCA stenosis.  Moderate stenosis of the mid basilar artery.    Neurology recommends aspirin  and Plavix for 3 weeks followed by aspirin alone.  Physical therapy saw the patient during hospitalization and recommend outpatient physical therapy.  History of retinal ischemia.  Spoke with patient's primary ophthalmologist Dr Laverna Peace at [facility] due to patient's concerns of some bleeding in the eye which was likely proliferative changes.  Dr. Luberta Mutter was okay with continuation of aspirin and Plavix  Hyperlipidemia:  Continue Lipitor.   Coronary artery disease: Stable.    Continue aspirin and Lipitor.   Hypertension:  Currently controlled.  Not on any medications.   Mild AKI.  Present on.  Received IV fluid hydration.  Improved after hydration.  Disposition.  At this time, patient is stable for disposition home with outpatient PT.  Patient will follow up with ophthalmology, neurology and PCP as outpatient.  Medical Consultants:   Neurology  Procedures:    None Subjective:   Today, patient was seen and examined at bedside.  Patient denies any dizziness lightheadedness headache.  Feels strong and wants to go home.  Discharge Exam:   Vitals:   02/17/21 0424 02/17/21 0740  BP: (!) 166/70 (!) 150/80  Pulse: 61 60  Resp: 18 17  Temp: 98.4 F (36.9 C) 97.7 F (36.5 C)  SpO2: 97% 97%   Vitals:   02/16/21 1921 02/17/21 0013 02/17/21 0424 02/17/21 0740  BP: (!) 151/70 (!) 160/81 (!) 166/70 (!) 150/80  Pulse: 64 65 61 60  Resp: 18 18 18 17   Temp: 98.2 F (36.8 C) 98.3 F (36.8 C) 98.4 F (36.9 C) 97.7 F (36.5 C)  TempSrc: Oral Oral Oral Oral  SpO2: 97% 96% 97% 97%  Weight:  Height:       General: Alert awake, not in obvious distress HENT: pupils equally reacting to light,  No scleral pallor or icterus noted. Oral mucosa is moist.  Chest:  Clear breath sounds.  Diminished breath sounds bilaterally. No crackles or wheezes.  CVS: S1 &S2 heard. No murmur.  Regular rate and rhythm. Abdomen: Soft, nontender, nondistended.  Bowel sounds are heard.    Extremities: No cyanosis, clubbing or edema.  Peripheral pulses are palpable. Psych: Alert, awake and oriented, normal mood CNS:  No cranial nerve deficits.  Power equal in all extremities.   Skin: Warm and dry.  No rashes noted.  The results of significant diagnostics from this hospitalization (including imaging, microbiology, ancillary and laboratory) are listed below for reference.     Diagnostic Studies:   MR ANGIO HEAD WO CONTRAST  Result Date: 02/16/2021 CLINICAL DATA:  Neuro deficit, acute stroke suspected. EXAM: MRA NECK WITHOUT AND WITH CONTRAST MRA HEAD WITHOUT CONTRAST TECHNIQUE: Multiplanar and multiecho pulse sequences of the neck were obtained without and with intravenous contrast. Angiographic images of the neck were obtained using MRA technique without and with intravenous contrast; Angiographic images of the Circle of Willis were obtained using MRA technique without intravenous contrast. CONTRAST:  7.22mL GADAVIST GADOBUTROL 1 MMOL/ML IV SOLN COMPARISON:  MRI head Feb 15, 2021. FINDINGS: Mildly motion limited exam in the head and neck. MRA NECK FINDINGS Great vessel origins are patent. Bilateral common carotid and internal carotid arteries are patent. Mild narrowing at the carotid bifurcations without greater than 50% stenosis. Codominant vertebral arteries which are patent without evidence of significant (greater than 50%) stenosis. MRA HEAD FINDINGS Anterior circulation: No large vessel occlusion, proximal hemodynamically significant stenosis or visible aneurysm. Posterior circulation: No large vessel occlusion. Irregular basilar artery, likely related to atherosclerosis. Mild to moderate (approximately 30-40%)a stenosis of the mid basilar artery. Bilateral posterior cerebral arteries are patent. Suspected severe stenosis of the distal left P2 PCA. IMPRESSION: MRA Headl: 1. No large vessel intracranial occlusion. 2. Suspected severe left distal P2 PCA stenosis. 3. Mild to moderate  (approximately 30-40%) stenosis of the mid basilar artery. MRA Neck: No visible hemodynamically significant stenosis. Electronically Signed   By: Feliberto Harts MD   On: 02/16/2021 06:46   MR ANGIO NECK W WO CONTRAST  Result Date: 02/16/2021 CLINICAL DATA:  Neuro deficit, acute stroke suspected. EXAM: MRA NECK WITHOUT AND WITH CONTRAST MRA HEAD WITHOUT CONTRAST TECHNIQUE: Multiplanar and multiecho pulse sequences of the neck were obtained without and with intravenous contrast. Angiographic images of the neck were obtained using MRA technique without and with intravenous contrast; Angiographic images of the Circle of Willis were obtained using MRA technique without intravenous contrast. CONTRAST:  7.95mL GADAVIST GADOBUTROL 1 MMOL/ML IV SOLN COMPARISON:  MRI head Feb 15, 2021. FINDINGS: Mildly motion limited exam in the head and neck. MRA NECK FINDINGS Great vessel origins are patent. Bilateral common carotid and internal carotid arteries are patent. Mild narrowing at the carotid bifurcations without greater than 50% stenosis. Codominant vertebral arteries which are patent without evidence of significant (greater than 50%) stenosis. MRA HEAD FINDINGS Anterior circulation: No large vessel occlusion, proximal hemodynamically significant stenosis or visible aneurysm. Posterior circulation: No large vessel occlusion. Irregular basilar artery, likely related to atherosclerosis. Mild to moderate (approximately 30-40%)a stenosis of the mid basilar artery. Bilateral posterior cerebral arteries are patent. Suspected severe stenosis of the distal left P2 PCA. IMPRESSION: MRA Headl: 1. No large vessel intracranial occlusion. 2. Suspected severe left distal  P2 PCA stenosis. 3. Mild to moderate (approximately 30-40%) stenosis of the mid basilar artery. MRA Neck: No visible hemodynamically significant stenosis. Electronically Signed   By: Feliberto HartsFrederick S Jones MD   On: 02/16/2021 06:46   MR BRAIN WO CONTRAST  Result Date:  02/15/2021 CLINICAL DATA:  Foot drop and left lower extremity weakness EXAM: MRI HEAD WITHOUT CONTRAST TECHNIQUE: Multiplanar, multiecho pulse sequences of the brain and surrounding structures were obtained without intravenous contrast. COMPARISON:  None. FINDINGS: Brain: Small area of abnormal diffusion restriction within the right thalamus. No acute or chronic hemorrhage. There is multifocal hyperintense T2-weighted signal within the white matter. Generalized volume loss without a clear lobar predilection. The midline structures are normal. Vascular: Major flow voids are preserved. Skull and upper cervical spine: Normal calvarium and skull base. Visualized upper cervical spine and soft tissues are normal. Sinuses/Orbits:No paranasal sinus fluid levels or advanced mucosal thickening. No mastoid or middle ear effusion. Normal orbits. IMPRESSION: 1. Small area of acute ischemia within the right thalamus. No hemorrhage or mass effect. 2. Findings of chronic small vessel disease and volume loss. Electronically Signed   By: Deatra RobinsonKevin  Herman M.D.   On: 02/15/2021 21:25   MR Cervical Spine Wo Contrast  Result Date: 02/15/2021 CLINICAL DATA:  Left lower extremity weakness EXAM: MRI CERVICAL SPINE WITHOUT CONTRAST TECHNIQUE: Multiplanar, multisequence MR imaging of the cervical spine was performed. No intravenous contrast was administered. COMPARISON:  None. FINDINGS: Alignment: Physiologic. Vertebrae: No fracture, evidence of discitis, or bone lesion. Cord: Normal signal and morphology. Posterior Fossa, vertebral arteries, paraspinal tissues: Negative. Disc levels: C1-2: Unremarkable. C2-3: Normal disc space and facet joints. There is no spinal canal stenosis. No neural foraminal stenosis. C3-4: Normal disc space and facet joints. There is no spinal canal stenosis. No neural foraminal stenosis. C4-5: Small disc bulge with uncovertebral hypertrophy. There is no spinal canal stenosis. No neural foraminal stenosis. C5-6:  Small disc bulge. There is no spinal canal stenosis. No neural foraminal stenosis. C6-7: Normal disc space and facet joints. There is no spinal canal stenosis. No neural foraminal stenosis. C7-T1: Normal disc space and facet joints. There is no spinal canal stenosis. No neural foraminal stenosis. IMPRESSION: Mild cervical degenerative disc disease without spinal canal or neural foraminal stenosis. Electronically Signed   By: Deatra RobinsonKevin  Herman M.D.   On: 02/15/2021 21:29   MR LUMBAR SPINE WO CONTRAST  Result Date: 02/15/2021 CLINICAL DATA:  Left lower extremity weakness EXAM: MRI LUMBAR SPINE WITHOUT CONTRAST TECHNIQUE: Multiplanar, multisequence MR imaging of the lumbar spine was performed. No intravenous contrast was administered. COMPARISON:  None. FINDINGS: Segmentation:  Standard. Alignment:  Physiologic. Vertebrae:  No fracture, evidence of discitis, or bone lesion. Conus medullaris and cauda equina: Conus extends to the L1 level. Conus and cauda equina appear normal. Paraspinal and other soft tissues: 2.4 cm left renal cyst. Disc levels: T12-L1: Normal disc space and facets. No spinal canal or neuroforaminal stenosis. L1-L2: Normal disc space and facets. No spinal canal or neuroforaminal stenosis. L2-L3: Normal disc space and facets. No spinal canal or neuroforaminal stenosis. L3-L4: Normal disc space and facets. No spinal canal or neuroforaminal stenosis. L4-L5: Normal disc space and facets. No spinal canal or neuroforaminal stenosis. L5-S1: Mild facet hypertrophy and small central disc protrusion. No spinal canal or neural foraminal stenosis. Visualized sacrum: Normal. IMPRESSION: Mild lower lumbar degenerative disc disease without spinal canal or neural foraminal stenosis. Electronically Signed   By: Deatra RobinsonKevin  Herman M.D.   On: 02/15/2021 21:33   ECHOCARDIOGRAM  COMPLETE  Result Date: 02/16/2021    ECHOCARDIOGRAM REPORT   Patient Name:   [place] SWAYZE PRIES Date of Exam: 02/16/2021 Medical Rec #:  [id number]     Height:       69.0 in Accession #:    [id number]   Weight:       165.0 lb Date of Birth:  1941-06-22    BSA:          1.904 m Patient Age:    80 years     BP:           148/77 mmHg Patient Gender: M            HR:           62 bpm. Exam Location:  Inpatient Procedure: 2D Echo, 3D Echo, Cardiac Doppler and Color Doppler Indications:    Stroke  History:        Patient has no prior history of Echocardiogram examinations. CAD                 and Previous Myocardial Infarction, Stroke; Risk                 Factors:Dyslipidemia.  Sonographer:    Sheralyn Boatman RDCS Referring Phys: [id number] SRISHTI L BHAGAT  Sonographer Comments: Suboptimal parasternal window. IMPRESSIONS  1. Left ventricular ejection fraction, by estimation, is 65 to 70%. The left ventricle has normal function. The left ventricle has no regional wall motion abnormalities. There is mild left ventricular hypertrophy. Left ventricular diastolic parameters are consistent with Grade I diastolic dysfunction (impaired relaxation).  2. Right ventricular systolic function is normal. The right ventricular size is normal. Tricuspid regurgitation signal is inadequate for assessing PA pressure.  3. The mitral valve is normal in structure. Trivial mitral valve regurgitation. No evidence of mitral stenosis.  4. The aortic valve is tricuspid. Aortic valve regurgitation is not visualized. Mild to moderate aortic valve sclerosis/calcification is present, without any evidence of aortic stenosis.  5. The inferior vena cava is normal in size with greater than 50% respiratory variability, suggesting right atrial pressure of 3 mmHg. FINDINGS  Left Ventricle: Left ventricular ejection fraction, by estimation, is 65 to 70%. The left ventricle has normal function. The left ventricle has no regional wall motion abnormalities. The left ventricular internal cavity size was normal in size. There is  mild left ventricular hypertrophy. Left ventricular diastolic parameters are consistent with  Grade I diastolic dysfunction (impaired relaxation). Right Ventricle: The right ventricular size is normal. No increase in right ventricular wall thickness. Right ventricular systolic function is normal. Tricuspid regurgitation signal is inadequate for assessing PA pressure. Left Atrium: Left atrial size was normal in size. Right Atrium: Right atrial size was normal in size. Pericardium: There is no evidence of pericardial effusion. Mitral Valve: The mitral valve is normal in structure. There is mild calcification of the mitral valve leaflet(s). Trivial mitral valve regurgitation. No evidence of mitral valve stenosis. Tricuspid Valve: The tricuspid valve is normal in structure. Tricuspid valve regurgitation is trivial. Aortic Valve: The aortic valve is tricuspid. Aortic valve regurgitation is not visualized. Mild to moderate aortic valve sclerosis/calcification is present, without any evidence of aortic stenosis. Aortic valve mean gradient measures 5.0 mmHg. Aortic valve peak gradient measures 7.8 mmHg. Aortic valve area, by VTI measures 2.51 cm. Pulmonic Valve: The pulmonic valve was normal in structure. Pulmonic valve regurgitation is not visualized. Aorta: The aortic root is normal in size and structure. Venous: The inferior vena cava  is normal in size with greater than 50% respiratory variability, suggesting right atrial pressure of 3 mmHg. IAS/Shunts: No atrial level shunt detected by color flow Doppler.  LEFT VENTRICLE PLAX 2D LVIDd:         3.90 cm     Diastology LVIDs:         1.95 cm     LV e' medial:    7.26 cm/s LV PW:         1.50 cm     LV E/e' medial:  12.6 LV IVS:        1.20 cm     LV e' lateral:   8.76 cm/s LVOT diam:     2.00 cm     LV E/e' lateral: 10.4 LV SV:         76 LV SV Index:   40 LVOT Area:     3.14 cm  LV Volumes (MOD) LV vol d, MOD A2C: 68.6 ml LV vol d, MOD A4C: 64.7 ml LV vol s, MOD A2C: 19.6 ml LV vol s, MOD A4C: 20.0 ml LV SV MOD A2C:     49.0 ml LV SV MOD A4C:     64.7 ml LV SV  MOD BP:      48.0 ml RIGHT VENTRICLE             IVC RV S prime:     15.10 cm/s  IVC diam: 1.20 cm TAPSE (M-mode): 2.5 cm LEFT ATRIUM             Index       RIGHT ATRIUM           Index LA diam:        3.10 cm 1.63 cm/m  RA Area:     13.00 cm LA Vol (A2C):   39.7 ml 20.85 ml/m RA Volume:   31.80 ml  16.70 ml/m LA Vol (A4C):   34.8 ml 18.28 ml/m LA Biplane Vol: 40.5 ml 21.27 ml/m  AORTIC VALVE AV Area (Vmax):    2.67 cm AV Area (Vmean):   2.00 cm AV Area (VTI):     2.51 cm AV Vmax:           140.00 cm/s AV Vmean:          107.000 cm/s AV VTI:            0.303 m AV Peak Grad:      7.8 mmHg AV Mean Grad:      5.0 mmHg LVOT Vmax:         119.00 cm/s LVOT Vmean:        68.200 cm/s LVOT VTI:          0.242 m LVOT/AV VTI ratio: 0.80  AORTA Ao Root diam: 3.30 cm Ao Asc diam:  3.20 cm MITRAL VALVE MV Area (PHT): 2.95 cm     SHUNTS MV Decel Time: 257 msec     Systemic VTI:  0.24 m MV E velocity: 91.20 cm/s   Systemic Diam: 2.00 cm MV A velocity: 119.00 cm/s MV E/A ratio:  0.77 Marca Ancona MD Electronically signed by Marca Ancona MD Signature Date/Time: 02/16/2021/7:52:55 PM    Final    CT HEAD CODE STROKE WO CONTRAST  Result Date: 02/15/2021 CLINICAL DATA:  Code stroke. Acute neuro deficit. Left leg heaviness. Slurred speech. EXAM: CT HEAD WITHOUT CONTRAST TECHNIQUE: Contiguous axial images were obtained from the base of the skull through the vertex without intravenous contrast. COMPARISON:  CT  head 06/19/2007 FINDINGS: Brain: Progressive atrophy. Marked atrophy of the hippocampus with large mint of the temporal horns bilaterally which has developed in the interval. Mild white matter hypodensity bilaterally is symmetric and has progressed in the interval. Negative for acute infarct, hemorrhage, mass Vascular: Negative for hyperdense vessel Skull: Negative Sinuses/Orbits: Paranasal sinuses clear. Bilateral cataract extraction Other: None ASPECTS (Alberta Stroke Program Early CT Score) - Ganglionic level  infarction (caudate, lentiform nuclei, internal capsule, insula, M1-M3 cortex): 7 - Supraganglionic infarction (M4-M6 cortex): 3 Total score (0-10 with 10 being normal): 10 IMPRESSION: 1. No acute abnormality 2. ASPECTS is 10 3. Progressive atrophy especially in the hippocampus. Correlate with dementia. Bilateral white matter hypodensity most consistent with chronic microvascular ischemia. 4. These results were called by telephone at the time of interpretation on 02/15/2021 at 5:20 pm to provider Pfeiffer , who verbally acknowledged these results. Electronically Signed   By: Marlan Palau M.D.   On: 02/15/2021 17:20     Labs:   Basic Metabolic Panel: Recent Labs  Lab 02/15/21 1706 02/15/21 1725 02/16/21 0557  NA 141 142  --   K 3.7 3.6  --   CL 107 106  --   CO2 29  --   --   GLUCOSE 81 74  --   BUN 25* 25*  --   CREATININE 1.45* 1.40* 1.30*  CALCIUM 8.7*  --   --    GFR Estimated Creatinine Clearance: 43.8 mL/min (A) (by C-G formula based on SCr of 1.3 mg/dL (H)). Liver Function Tests: Recent Labs  Lab 02/15/21 1706  AST 19  ALT 11  ALKPHOS 51  BILITOT 0.5  PROT 7.5  ALBUMIN 3.8   No results for input(s): LIPASE, AMYLASE in the last 168 hours. No results for input(s): AMMONIA in the last 168 hours. Coagulation profile Recent Labs  Lab 02/15/21 1706  INR 1.0    CBC: Recent Labs  Lab 02/15/21 1706 02/15/21 1725 02/16/21 0557  WBC 8.1  --  7.7  NEUTROABS 4.1  --   --   HGB 12.9* 12.9* 13.2  HCT 39.9 38.0* 39.7  MCV 94.8  --  91.3  PLT 294  --  301   Cardiac Enzymes: No results for input(s): CKTOTAL, CKMB, CKMBINDEX, TROPONINI in the last 168 hours. BNP: Invalid input(s): POCBNP CBG: Recent Labs  Lab 02/15/21 1720  GLUCAP 68*   D-Dimer No results for input(s): DDIMER in the last 72 hours. Hgb A1c Recent Labs    02/16/21 0557  HGBA1C 5.7*   Lipid Profile Recent Labs    02/16/21 0557  CHOL 237*  HDL 62  LDLCALC 156*  TRIG 94  CHOLHDL 3.8    Thyroid function studies No results for input(s): TSH, T4TOTAL, T3FREE, THYROIDAB in the last 72 hours.  Invalid input(s): FREET3 Anemia work up No results for input(s): VITAMINB12, FOLATE, FERRITIN, TIBC, IRON, RETICCTPCT in the last 72 hours. Microbiology Recent Results (from the past 240 hour(s))  Resp Panel by RT-PCR (Flu A&B, Covid) Nasopharyngeal Swab     Status: None   Collection Time: 02/15/21  5:06 PM   Specimen: Nasopharyngeal Swab; Nasopharyngeal(NP) swabs in vial transport medium  Result Value Ref Range Status   SARS Coronavirus 2 by RT PCR NEGATIVE NEGATIVE Final    Comment: (NOTE) SARS-CoV-2 target nucleic acids are NOT DETECTED.  The SARS-CoV-2 RNA is generally detectable in upper respiratory specimens during the acute phase of infection. The lowest concentration of SARS-CoV-2 viral copies this assay can detect is  138 copies/mL. A negative result does not preclude SARS-Cov-2 infection and should not be used as the sole basis for treatment or other patient management decisions. A negative result may occur with  improper specimen collection/handling, submission of specimen other than nasopharyngeal swab, presence of viral mutation(s) within the areas targeted by this assay, and inadequate number of viral copies(<138 copies/mL). A negative result must be combined with clinical observations, patient history, and epidemiological information. The expected result is Negative.  Fact Sheet for Patients:  BloggerCourse.com  Fact Sheet for Healthcare Providers:  SeriousBroker.it  This test is no t yet approved or cleared by the [place] FDA and  has been authorized for detection and/or diagnosis of SARS-CoV-2 by FDA under an Emergency Use Authorization (EUA). This EUA will remain  in effect (meaning this test can be used) for the duration of the COVID-19 declaration under Section 564(b)(1) of the Act,  21 U.S.C.section 360bbb-3(b)(1), unless the authorization is terminated  or revoked sooner.       Influenza A by PCR NEGATIVE NEGATIVE Final   Influenza B by PCR NEGATIVE NEGATIVE Final    Comment: (NOTE) The Xpert Xpress SARS-CoV-2/FLU/RSV plus assay is intended as an aid in the diagnosis of influenza from Nasopharyngeal swab specimens and should not be used as a sole basis for treatment. Nasal washings and aspirates are unacceptable for Xpert Xpress SARS-CoV-2/FLU/RSV testing.  Fact Sheet for Patients: BloggerCourse.com  Fact Sheet for Healthcare Providers: SeriousBroker.it  This test is not yet approved or cleared by the [place] FDA and has been authorized for detection and/or diagnosis of SARS-CoV-2 by FDA under an Emergency Use Authorization (EUA). This EUA will remain in effect (meaning this test can be used) for the duration of the COVID-19 declaration under Section 564(b)(1) of the Act, 21 U.S.C. section 360bbb-3(b)(1), unless the authorization is terminated or revoked.  Performed at [facility], [address] [place], [place] [address]      Discharge Instructions:   Discharge Instructions     Ambulatory referral to Neurology   Complete by: As directed    An appointment is requested in approximately: 4 weeks   Ambulatory referral to Physical Therapy   Complete by: As directed    Diet - low sodium heart healthy   Complete by: As directed    Discharge instructions   Complete by: As directed    Follow-up with your primary care physician in 1 week for regular check up and blood work.  Continue aspirin and Plavix as instructed.  Follow-up with neurology in 3 to 4 weeks (office to call for an appointment).  Seek medical attention for worsening symptoms.  Follow-up with your eye doctor as scheduled by you.   Increase activity slowly   Complete by: As directed       Allergies as of  02/17/2021       Reactions   Meloxicam Other (See Comments)   Feels bad (reported by [facility] 2021)        Medication List     TAKE these medications    aspirin EC 81 MG tablet Take 1 tablet (81 mg total) by mouth daily. Swallow whole. What changed: when to take this   atorvastatin 40 MG tablet Commonly known as: LIPITOR Take 1 tablet (40 mg total) by mouth daily.   clopidogrel 75 MG tablet Commonly known as: PLAVIX Take 1 tablet (75 mg total) by mouth daily for 21 days.   COQ10 PO Take 1 capsule by mouth  daily.   latanoprost 0.005 % ophthalmic solution Commonly known as: XALATAN Place 1 drop into the left eye at bedtime.   MAGNESIUM PO Take 1 tablet by mouth daily.   nitroGLYCERIN 0.4 MG SL tablet Commonly known as: NITROSTAT PLACE 1 TABLET UNDER THE TONGUE EVERY 5 MINUTES AS NEEDED FOR CHEST PAIN What changed: See the new instructions.   VITAMIN C PO Take 1 tablet by mouth daily.   VITAMIN D PO Take 1 tablet by mouth daily.        Follow-up Information     [facility] Follow [facility].   Specialty: Rehabilitation Why: The outpatient rehab will contact you for the first appointment Contact information: [address] [id number] mc [place] [address] [phone]        Blair Heys, MD. Schedule an appointment as soon as possible for a visit in 1 week(s).   Specialty: Family Medicine Why: regular followup, blood work Contact information: [address] [place] [address] [phone]         Lyn Records, MD .   Specialty: Cardiology Contact information: [phone] [address] [place] [address] [phone]                  Time coordinating discharge: 39 minutes  Signed:  Macaela Presas  [facility] 02/17/2021, 8:39 AM

## 2021-02-17 NOTE — Plan of Care (Signed)
  Problem: Education: Goal: Knowledge of General Education information will improve Description: Including pain rating scale, medication(s)/side effects and non-pharmacologic comfort measures Outcome: Adequate for Discharge   Problem: Health Behavior/Discharge Planning: Goal: Ability to manage health-related needs will improve Outcome: Adequate for Discharge   Problem: Clinical Measurements: Goal: Ability to maintain clinical measurements within normal limits will improve Outcome: Adequate for Discharge Goal: Will remain free from infection Outcome: Adequate for Discharge Goal: Diagnostic test results will improve Outcome: Adequate for Discharge Goal: Respiratory complications will improve Outcome: Adequate for Discharge Goal: Cardiovascular complication will be avoided Outcome: Adequate for Discharge   Problem: Safety: Goal: Ability to remain free from injury will improve Outcome: Adequate for Discharge   Problem: Ischemic Stroke/TIA Tissue Perfusion: Goal: Complications of ischemic stroke/TIA will be minimized Outcome: Adequate for Discharge

## 2021-02-17 NOTE — TOC Transition Note (Signed)
Transition of Care [facility]) - CM/SW Discharge Note   Patient Details  Name: Philip Parsons MRN: [id number] Date of Birth: 1940-11-04  Transition of Care [facility]) CM/SW Contact:  Kermit Balo, RN Phone Number: 02/17/2021, 10:02 AM   Clinical Narrative:    Patient is discharging home with outpatient therapy through [facility]. Information on the AVS.  Pt has transportation home today.   Final next level of care: OP Rehab Barriers to Discharge: No Barriers Identified   Patient Goals and CMS Choice     Choice offered to / list presented to : Patient  Discharge Placement                       Discharge Plan and Services   Discharge Planning Services: CM Consult                                 Social Determinants of Health (SDOH) Interventions     Readmission Risk Interventions No flowsheet data found.

## 2021-02-25 ENCOUNTER — Encounter: Payer: Self-pay | Admitting: Physical Therapy

## 2021-02-25 ENCOUNTER — Ambulatory Visit: Payer: Medicare Other | Attending: Internal Medicine | Admitting: Physical Therapy

## 2021-02-25 ENCOUNTER — Other Ambulatory Visit: Payer: Self-pay

## 2021-02-25 DIAGNOSIS — M6281 Muscle weakness (generalized): Secondary | ICD-10-CM | POA: Diagnosis not present

## 2021-02-25 DIAGNOSIS — R2681 Unsteadiness on feet: Secondary | ICD-10-CM | POA: Diagnosis not present

## 2021-02-25 DIAGNOSIS — E7800 Pure hypercholesterolemia, unspecified: Secondary | ICD-10-CM | POA: Diagnosis not present

## 2021-02-25 DIAGNOSIS — R2689 Other abnormalities of gait and mobility: Secondary | ICD-10-CM | POA: Insufficient documentation

## 2021-02-25 DIAGNOSIS — Z8673 Personal history of transient ischemic attack (TIA), and cerebral infarction without residual deficits: Secondary | ICD-10-CM | POA: Diagnosis not present

## 2021-02-25 DIAGNOSIS — R7303 Prediabetes: Secondary | ICD-10-CM | POA: Diagnosis not present

## 2021-02-25 DIAGNOSIS — I6990 Unspecified sequelae of unspecified cerebrovascular disease: Secondary | ICD-10-CM | POA: Diagnosis not present

## 2021-02-25 DIAGNOSIS — I25119 Atherosclerotic heart disease of native coronary artery with unspecified angina pectoris: Secondary | ICD-10-CM | POA: Diagnosis not present

## 2021-02-25 NOTE — Therapy (Signed)
[facility] [address] [place], [place], [address] Phone: [phone]   Fax:  [phone]  Physical Therapy Evaluation  Patient Details  Name: Philip Parsons MRN: [id number] Date of Birth: 02-21-1941 Referring Provider (PT): Blair Heys, MD (PCP)   Encounter Date: 02/25/2021   PT End of Session - 02/25/21 1832    Visit Number 1    Number of Visits 9    Date for PT Re-Evaluation 03/27/21    Authorization Type Medicare    PT Start Time 1706    PT Stop Time 1748    PT Time Calculation (min) 42 min    Activity Tolerance Patient tolerated treatment well    Behavior During Therapy [facility] for tasks assessed/performed           Past Medical History:  Diagnosis Date  . Coronary artery disease   . History of intravascular stent placement   . No pertinent past medical history     Past Surgical History:  Procedure Laterality Date  . LEFT HEART CATH N/A 02/02/2012   Procedure: LEFT HEART CATH;  Surgeon: Robynn Pane, MD;  Location: [facility] C[facility] LAB;  Service: Cardiovascular;  Laterality: N/A;  . LEFT HEART CATHETERIZATION WITH CORONARY ANGIOGRAM N/A 05/10/2013   Procedure: LEFT HEART CATHETERIZATION WITH CORONARY ANGIOGRAM;  Surgeon: Lesleigh Noe, MD;  Location: [facility] CATH LAB;  Service: Cardiovascular;  Laterality: N/A;  . NO PAST SURGERIES      There were no vitals filed for this visit.    Subjective Assessment - 02/25/21 1709    Subjective Pt reports having waekness in LLE while eating dinner at home.  Went to ED, and a few days after, the left side wasn't as strong as it was.  Feel like I still shuffle a little when I walk.  Daughter reports mis-step on the steps-caught the L foot on the edge of the step and went down on knees.  Didn't hurt anything.  I feel like I'm picking up my foot, but it's not lifting as high as I think.    Patient is accompained by: Family member   daughter   Pertinent History PMH CAD, HTN,  gout, hyperlipidemia    Patient Stated Goals Pt's goals for therapy are to stop the shuffling.    Currently in Pain? No/denies   Occasional pain in L low back-feels like back is tight; as I move around it gets better; PT will monitor             [facility] PT Assessment - 02/25/21 1713      Assessment   Medical Diagnosis LLE weakness, acute CVA    Referring Provider (PT) Blair Heys, MD (PCP)    Onset Date/Surgical Date 02/15/21    Hand Dominance Right      Precautions   Precautions Fall    Precaution Comments --      Balance Screen   Has the patient fallen in the past 6 months No    Has the patient had a decrease in activity level because of a fear of falling?  No    Is the patient reluctant to leave their home because of a fear of falling?  No      Home Nurse, mental health Private residence    Living Arrangements Spouse/significant other    Available Help at Discharge Family    Type of Home House    Home Access Stairs to enter    Entrance Stairs-Number  of Steps 2    Entrance Stairs-Rails None    Home Layout Two level;Bed/bath upstairs    Home Equipment Walker - 2 wheels   Walking poles     Prior Function   Level of Independence Independent    Vocation Retired    [employer] Was in [employer] examiner; Psychologist, educational    Leisure Walking in neighborhood 1-3 miles per day.      Observation/Other Assessments   Focus on Therapeutic Outcomes (FOTO)  Functional Intake score :  65      Sensation   Light Touch Appears Intact    Proprioception Appears Intact      Posture/Postural Control   Posture/Postural Control Postural limitations    Postural Limitations Posterior pelvic tilt   sitting in chair; able to correct with cues     ROM / Strength   AROM / PROM / Strength Strength      Strength   Overall Strength Within functional limits for tasks performed      Transfers   Transfers Sit to Stand;Stand to Sit    Sit to Stand 6: Modified independent  (Device/Increase time);From chair/3-in-1;Without upper extremity assist    Five time sit to stand comments  10.56    Stand to Sit 6: Modified independent (Device/Increase time);Without upper extremity assist;To chair/3-in-1      Ambulation/Gait   Ambulation/Gait Yes    Ambulation/Gait Assistance 5: Supervision    Ambulation Distance (Feet) 300 Feet    Assistive device None    Gait Pattern Step-through pattern;Decreased step length - left;Poor foot clearance - left    Ambulation Surface Level;Indoor    Gait velocity 10.93 sec= 3 ft/sec      Standardized Balance Assessment   Standardized Balance Assessment Timed Up and Go Test      Timed Up and Go Test   Normal TUG (seconds) 12.72      Functional Gait  Assessment   Gait assessed  Yes    Gait Level Surface Walks 20 ft in less than 7 sec but greater than 5.5 sec, uses assistive device, slower speed, mild gait deviations, or deviates 6-10 in outside of the 12 in walkway width.   6.75   Change in Gait Speed Able to smoothly change walking speed without loss of balance or gait deviation. Deviate no more than 6 in outside of the 12 in walkway width.    Gait with Horizontal Head Turns Performs head turns smoothly with slight change in gait velocity (eg, minor disruption to smooth gait path), deviates 6-10 in outside 12 in walkway width, or uses an assistive device.   8.6   Gait with Vertical Head Turns Performs task with slight change in gait velocity (eg, minor disruption to smooth gait path), deviates 6 - 10 in outside 12 in walkway width or uses assistive device   7.6   Gait and Pivot Turn Pivot turns safely within 3 sec and stops quickly with no loss of balance.    Step Over Obstacle Is able to step over one shoe box (4.5 in total height) without changing gait speed. No evidence of imbalance.    Gait with Narrow Base of Support Is able to ambulate for 10 steps heel to toe with no staggering.    Gait with Eyes Closed Walks 20 ft, uses assistive  device, slower speed, mild gait deviations, deviates 6-10 in outside 12 in walkway width. Ambulates 20 ft in less than 9 sec but greater than 7 sec.   9.38  Ambulating Backwards Walks 20 ft, no assistive devices, good speed, no evidence for imbalance, normal gait    Steps Alternating feet, must use rail.    Total Score 24    FGA comment: Scores <22/30 indicate increased fall risk.                      Objective measurements completed on examination: See above findings.               PT Education - 02/25/21 1830    Education Details Cues for increased foot clearance:  audio cues (try not to hear your foot scuff the floor); sensory/perception cues (feel like you are moving your left foot bigger than you should); education on foot placement and foot clearance with stair negotiation    Person(s) Educated Patient;Child(ren)   daughter   Methods Explanation;Demonstration;Verbal cues    Comprehension Verbalized understanding;Returned demonstration;Need further instruction               PT Long Term Goals - 02/25/21 1841      PT LONG TERM GOAL #1   Title Pt will be independent with HEP for improved balance and gait.  TARGET 03/27/2021    Time 5    Period Weeks    Status New      PT LONG TERM GOAL #2   Title Pt will improve FGA score to at least 27/30 to decrease fall risk.    Baseline 24/30    Time 5    Period Weeks    Status New      PT LONG TERM GOAL #3   Title to be assessed and goal to be written as appropriate.    Time 5    Period Weeks    Status New      PT LONG TERM GOAL #4   Title Pt will negotiate at least 12 steps, using rail, with step through pattern, no LOB, mod I for improved stair negotiation for home.    Baseline supervision, has had a near fall on steps since d/c from hosptial    Time 5    Period Weeks    Status New      PT LONG TERM GOAL #5   Title Pt will ambulate at least 1000 ft indoor and outdoor surfaces, independently for  improved return to walking for exercise in community.    Time 5    Period Weeks    Status New      Additional Long Term Goals   Additional Long Term Goals Yes      PT LONG TERM GOAL #6   Title Pt will improve FOTO score to at least 78 to demonstrate improved percieved functional mobility, at discharge.    Baseline 65    Time 5    Period Weeks    Status New                  Plan - 02/25/21 1833    Clinical Impression Statement Pt is an 80 year old male who presents to OPPT with history of acute CVA (thalamic infarct) with LLE involvement, 02/15/21.  He was discharged home with recommendations for OPPT.  He presents to OPPT with abnormality of gait, decreased timing and coordination of gait, decreased high level balance.  He demo and reports sensory/perception mismatch, with pt feeling he is making normal motion with LLE, but in fact, he demo shuffling pattern with decreased LLE foot clearance and step length.  With cues, he is able to respond to improved foot clearance and step length.  Prior to CVA, he was independent and walking daily (1-3 miles).  He will benefit from skilled PT to address the above stated deficits for improved functional mobility, decreased fall risk and return to prior level of independent function.    Personal Factors and Comorbidities Comorbidity 3+    Comorbidities PMH CAD, HTN, gout, hyperlipidemia    Examination-Activity Limitations Locomotion Level;Stairs    Examination-Participation Restrictions Community Activity;Other   Exercise/walking in neighborhood-1-3 miles/day   Stability/Clinical Decision Making Evolving/Moderate complexity    Clinical Decision Making Moderate    Rehab Potential Good    PT Frequency Other (comment)   1x/wk (including eval), then 2x/wk for 4 weeks   PT Duration Other (comment)   5 weeks total POC   PT Treatment/Interventions ADLs/Self Care Home Management;Gait training;Stair training;Functional mobility training;Therapeutic  activities;Therapeutic exercise;Balance training;Neuromuscular re-education;Patient/family education    PT Next Visit Plan Perform and write goal as appropriate.  Review cues and stair training for imrpoved foot clearance/foot placement.  Initiate HEP for improved foot clearance, more BIG/deliberate movement patterns on LLE; stair training, gait training outdoors (pt has walking poles at home (new) and can use if needed)    Consulted and Agree with Plan of Care Patient;Family member/caregiver    Family Member Consulted daughter           Patient will benefit from skilled therapeutic intervention in order to improve the following deficits and impairments:  Abnormal gait,Decreased coordination,Difficulty walking,Impaired perceived functional ability,Decreased balance  Visit Diagnosis: Other abnormalities of gait and mobility  Unsteadiness on feet     Problem List Patient Active Problem List   Diagnosis Date Noted  . Acute CVA (cerebrovascular accident) (HCC) 02/15/2021  . Gout 02/15/2021  . AKI (acute kidney injury) (HCC) 02/15/2021  . Hyperlipidemia 10/17/2013  . 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 Coronary atherosclerosis of native coronary artery 05/10/2013  . Myocardial infarction, anterolateral wall, acute (HCC) 02/03/2012    Andrus Sharp W. 02/25/2021, 6:47 PM  Gean Maidens., PT   [facility] [address] [place], [place], [address] Phone: [phone]   Fax:  [phone]  Name: SHADI SESSLER MRN: [id number] Date of Birth: [birth year]

## 2021-03-04 ENCOUNTER — Ambulatory Visit: Payer: Medicare Other

## 2021-03-04 ENCOUNTER — Other Ambulatory Visit: Payer: Self-pay

## 2021-03-04 DIAGNOSIS — R2689 Other abnormalities of gait and mobility: Secondary | ICD-10-CM | POA: Diagnosis not present

## 2021-03-04 DIAGNOSIS — R2681 Unsteadiness on feet: Secondary | ICD-10-CM

## 2021-03-04 DIAGNOSIS — M6281 Muscle weakness (generalized): Secondary | ICD-10-CM

## 2021-03-04 NOTE — Therapy (Signed)
[facility] [address] [place], [place], [address] Phone: [phone]   Fax:  [phone]  Physical Therapy Treatment  Patient Details  Name: Philip Parsons MRN: [id number] Date of Birth: 1941/06/14 Referring Provider (PT): Blair Heys, MD (PCP)   Encounter Date: 03/04/2021   PT End of Session - 03/04/21 1417    Visit Number 2    Number of Visits 9    Date for PT Re-Evaluation 03/27/21    Authorization Type Medicare    PT Start Time 1145    PT Stop Time 1230    PT Time Calculation (min) 45 min    Equipment Utilized During Treatment Gait belt    Activity Tolerance Patient tolerated treatment well    Behavior During Therapy [facility] for tasks assessed/performed           Past Medical History:  Diagnosis Date  . Coronary artery disease   . History of intravascular stent placement   . No pertinent past medical history     Past Surgical History:  Procedure Laterality Date  . LEFT HEART CATH N/A 02/02/2012   Procedure: LEFT HEART CATH;  Surgeon: Robynn Pane, MD;  Location: [facility] CATH LAB;  Service: Cardiovascular;  Laterality: N/A;  . LEFT HEART CATHETERIZATION WITH CORONARY ANGIOGRAM N/A 05/10/2013   Procedure: LEFT HEART CATHETERIZATION WITH CORONARY ANGIOGRAM;  Surgeon: Lesleigh Noe, MD;  Location: [facility] CATH LAB;  Service: Cardiovascular;  Laterality: N/A;  . NO PAST SURGERIES      There were no vitals filed for this visit.   Subjective Assessment - 03/04/21 1152    Subjective No falls, medication changes of LOB, feels LLE has regained strength since last session and no longer scuffs his heel    Patient is accompained by: Family member   daughter   Pertinent History PMH CAD, HTN, gout, hyperlipidemia    Patient Stated Goals Pt's goals for therapy are to stop the shuffling.              [facility] PT Assessment - 03/04/21 0001      6 Minute Walk- Baseline   6 Minute Walk- Baseline yes    BP (mmHg) 152/78     HR (bpm) 85    02 Sat (%RA) 95 %      6 Minute walk- Post Test   6 Minute Walk Post Test yes    BP (mmHg) 163/85    HR (bpm) 90    02 Sat (%RA) 95 %      6 minute walk test results    Aerobic Endurance Distance Walked 1125    Endurance additional comments no need of AD                         [facility] Adult PT Treatment/Exercise - 03/04/21 0001      Transfers   Transfers Sit to Stand    Sit to Stand 6: Modified independent (Device/Increase time)    Comments 10 reps      Knee/Hip Exercises: Standing   Heel Raises 15 reps;Limitations    Heel Raises Limitations UE support    Other Standing Knee Exercises marching, 15x2 with UE support    Other Standing Knee Exercises tandem standing 30s hold 2x ea. position               Balance Exercises - 03/04/21 0001      Balance Exercises: Standing   Tandem Stance Eyes  open;30 secs   30s hold   Sidestepping Upper extremity support;5 reps;Limitations    Sidestepping Limitations performed at counter                  PT Long Term Goals - 03/04/21 1421      PT LONG TERM GOAL #3   Title to be assessed and goal to be written as appropriate. 03/04/21 Reassess a DC to monitor BP    Baseline 1125 ft walked with vital signs assessed    Time 5    Period Weeks    Status New                 Plan - 03/04/21 1419    Clinical Impression Statement Todays session consisted of assessment of 6 MWT with goal set, instruction in HEP and answering any questions, required some cuing in marching to match LLE ROM to R    Personal Factors and Comorbidities Comorbidity 3+    Comorbidities PMH CAD, HTN, gout, hyperlipidemia    Examination-Activity Limitations Locomotion Level;Stairs    Examination-Participation Restrictions Community Activity;Other   Exercise/walking in neighborhood-1-3 miles/day   Stability/Clinical Decision Making Evolving/Moderate complexity    Rehab Potential Good    PT Frequency Other (comment)    1x/wk (including eval), then 2x/wk for 4 weeks   PT Duration Other (comment)   5 weeks total POC   PT Treatment/Interventions ADLs/Self Care Home Management;Gait training;Stair training;Functional mobility training;Therapeutic activities;Therapeutic exercise;Balance training;Neuromuscular re-education;Patient/family education    PT Next Visit Plan Perform and write goal as appropriate.  Review cues and stair training for imrpoved foot clearance/foot placement.  Initiate HEP for improved foot clearance, more BIG/deliberate movement patterns on LLE; stair training, gait training outdoors (pt has walking poles at home (new) and can use if needed)    Consulted and Agree with Plan of Care Patient;Family member/caregiver    Family Member Consulted daughter           Patient will benefit from skilled therapeutic intervention in order to improve the following deficits and impairments:  Abnormal gait,Decreased coordination,Difficulty walking,Impaired perceived functional ability,Decreased balance  Visit Diagnosis: Unsteadiness on feet  Muscle weakness (generalized)  Other abnormalities of gait and mobility     Problem List Patient Active Problem List   Diagnosis Date Noted  . Acute CVA (cerebrovascular accident) (HCC) 02/15/2021  . Gout 02/15/2021  . AKI (acute kidney injury) (HCC) 02/15/2021  . Hyperlipidemia 10/17/2013  . 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 Coronary atherosclerosis of native coronary artery 05/10/2013  . Myocardial infarction, anterolateral wall, acute (HCC) 02/03/2012    Hildred Laser 03/04/2021, 2:25 PM  [facility] [address] [place], [place], [address] Phone: [phone]   Fax:  [phone]  Name: Philip Parsons MRN: [id number] Date of Birth: 10/06/1940

## 2021-03-10 ENCOUNTER — Other Ambulatory Visit: Payer: Self-pay

## 2021-03-10 ENCOUNTER — Ambulatory Visit: Payer: Medicare Other

## 2021-03-10 DIAGNOSIS — R2681 Unsteadiness on feet: Secondary | ICD-10-CM | POA: Diagnosis not present

## 2021-03-10 DIAGNOSIS — M6281 Muscle weakness (generalized): Secondary | ICD-10-CM | POA: Diagnosis not present

## 2021-03-10 DIAGNOSIS — R2689 Other abnormalities of gait and mobility: Secondary | ICD-10-CM

## 2021-03-10 NOTE — Therapy (Signed)
[facility] [address] [place], [place], [address] Phone: [phone]   Fax:  [phone]  Physical Therapy Treatment  Patient Details  Name: Philip Parsons MRN: [id number] Date of Birth: 05/31/1941 Referring Provider (PT): Blair Heys, MD (PCP)   Encounter Date: 03/10/2021   PT End of Session - 03/10/21 1111     Visit Number 3    Number of Visits 9    Date for PT Re-Evaluation 03/27/21    Authorization Type Medicare    PT Start Time 1110    PT Stop Time 1145    PT Time Calculation (min) 35 min    Equipment Utilized During Treatment Gait belt    Activity Tolerance Patient tolerated treatment well    Behavior During Therapy WFL for tasks assessed/performed             Past Medical History:  Diagnosis Date   Coronary artery disease    History of intravascular stent placement    No pertinent past medical history     Past Surgical History:  Procedure Laterality Date   LEFT HEART CATH N/A 02/02/2012   Procedure: LEFT HEART CATH;  Surgeon: Robynn Pane, MD;  Location: [facility] CATH LAB;  Service: Cardiovascular;  Laterality: N/A;   LEFT HEART CATHETERIZATION WITH CORONARY ANGIOGRAM N/A 05/10/2013   Procedure: LEFT HEART CATHETERIZATION WITH CORONARY ANGIOGRAM;  Surgeon: Lesleigh Noe, MD;  Location: [facility] CATH LAB;  Service: Cardiovascular;  Laterality: N/A;   NO PAST SURGERIES      There were no vitals filed for this visit.   Subjective Assessment - 03/10/21 1110     Subjective No falls or medication changes, rates himself at 98%.  Feels L sided symptoms have resolved.    Patient is accompained by: Family member   daughter   Pertinent History PMH CAD, HTN, gout, hyperlipidemia    Patient Stated Goals Pt's goals for therapy are to stop the shuffling.                               [facility] Adult PT Treatment/Exercise - 03/10/21 0001       Transfers   Transfers Sit to Stand    Sit to  Stand 6: Modified independent (Device/Increase time)    Stand to Sit 6: Modified independent (Device/Increase time);Without upper extremity assist;To chair/3-in-1    Comments 10 reps from [employer] no UE support      Ambulation/Gait   Ambulation/Gait Yes    Ambulation/Gait Assistance 6: Modified independent (Device/Increase time)    Ambulation Distance (Feet) 1000 Feet    Assistive device None    Gait Pattern Step-through pattern;Decreased step length - left;Poor foot clearance - left    Ambulation Surface Level;Unlevel;Indoor;Outdoor;Grass;Paved    Gait velocity 3 ft/s    Stairs Yes    Stairs Assistance 6: Modified independent (Device/Increase time)    Stair Management Technique No rails;Alternating pattern    Number of Stairs 16    Gait Comments Able to ambulate across grassy surfaces as well as perform fig 8s, OH reaching from inclines and downslopes w/o LOB                         PT Long Term Goals - 03/04/21 1421       PT LONG TERM GOAL #3   Title to be assessed and goal to be written as appropriate.  03/04/21 Reassess a DC to monitor BP    Baseline 1125 ft walked with vital signs assessed    Time 5    Period Weeks    Status New                   Plan - 03/10/21 1118     Clinical Impression Statement Todays session consisted of aerobic conditioning on [place], assessment of stair climbing, outside ambulaiton across various surfaces, OH reaching tasks from sloped ground and directional changes, functional testing reviewed and progress noted.  Patient currently performing at a high level    Personal Factors and Comorbidities Comorbidity 3+    Comorbidities PMH CAD, HTN, gout, hyperlipidemia    Examination-Activity Limitations Locomotion Level;Stairs    Examination-Participation Restrictions Community Activity;Other   Exercise/walking in neighborhood-1-3 miles/day   Stability/Clinical Decision Making Evolving/Moderate complexity    Rehab Potential Good     PT Frequency Other (comment)   1x/wk (including eval), then 2x/wk for 4 weeks   PT Duration Other (comment)   5 weeks total POC   PT Treatment/Interventions ADLs/Self Care Home Management;Gait training;Stair training;Functional mobility training;Therapeutic activities;Therapeutic exercise;Balance training;Neuromuscular re-education;Patient/family education    PT Next Visit Plan Assess remaining goals including DGI, add high level balance tasks to determine deficits    PT Home Exercise Plan [email or web address]    Consulted and Agree with Plan of Care Patient;Family member/caregiver    Family Member Consulted daughter             Patient will benefit from skilled therapeutic intervention in order to improve the following deficits and impairments:  Abnormal gait, Decreased coordination, Difficulty walking, Impaired perceived functional ability, Decreased balance  Visit Diagnosis: Unsteadiness on feet  Other abnormalities of gait and mobility  Muscle weakness (generalized)     Problem List Patient Active Problem List   Diagnosis Date Noted   Acute CVA (cerebrovascular accident) (HCC) 02/15/2021   Gout 02/15/2021   AKI (acute kidney injury) (HCC) 02/15/2021   Hyperlipidemia 10/17/2013   Coronary stent restenosis due to progression of disease 05/10/2013   Coronary atherosclerosis of native coronary artery 05/10/2013   Myocardial infarction, anterolateral wall, acute (HCC) 02/03/2012    Hildred Laser PT 03/10/2021, 12:22 PM  [facility] [address] [place], [place], [address] Phone: [phone]   Fax:  [phone]  Name: Philip Parsons MRN: [id number] Date of Birth: Aug 01, 1941

## 2021-03-10 NOTE — Patient Instructions (Signed)
Access Code: [email or web address] URL: [email or web address] Date: 03/10/2021 Prepared by: Gustavus Bryant  Exercises Sit to Stand with Arms Crossed - 2 x daily - 7 x weekly - 1 sets - 10 reps Heel Toe Raises with Counter Support - 2 x daily - 7 x weekly - 1 sets - 15 reps Standing Marching - 2 x daily - 7 x weekly - 1 sets - 15 reps Tandem Stance with Support - 2 x daily - 7 x weekly - 2 sets - 2 reps - 30s hold Side Stepping with Counter Support - 2 x daily - 7 x weekly - 1 sets - 5 reps

## 2021-03-13 ENCOUNTER — Other Ambulatory Visit: Payer: Self-pay

## 2021-03-13 ENCOUNTER — Encounter: Payer: Self-pay | Admitting: Physical Therapy

## 2021-03-13 ENCOUNTER — Ambulatory Visit: Payer: Medicare Other | Admitting: Physical Therapy

## 2021-03-13 DIAGNOSIS — R2681 Unsteadiness on feet: Secondary | ICD-10-CM

## 2021-03-13 DIAGNOSIS — R2689 Other abnormalities of gait and mobility: Secondary | ICD-10-CM

## 2021-03-13 DIAGNOSIS — M6281 Muscle weakness (generalized): Secondary | ICD-10-CM | POA: Diagnosis not present

## 2021-03-16 NOTE — Therapy (Signed)
[facility] [address] [place], [place], [address] Phone: [phone]   Fax:  [phone]  Physical Therapy Treatment  Patient Details  Name: Philip Parsons MRN: [id number] Date of Birth: 10-14-1940 Referring Provider (PT): Gaynelle Arabian, MD (PCP)   Encounter Date: 03/13/2021    03/13/21 1323  PT Visits / Re-Eval  Visit Number 4  Number of Visits 9  Date for PT Re-Evaluation 03/27/21  Authorization  Authorization Type Medicare  PT Time Calculation  PT Start Time 1318  PT Stop Time 5456 (discharge visit, not all time was needed)  PT Time Calculation (min) 33 min  PT - End of Session  Equipment Utilized During Treatment Gait belt  Activity Tolerance Patient tolerated treatment well  Behavior During Therapy [facility] for tasks assessed/performed    Past Medical History:  Diagnosis Date   Coronary artery disease    History of intravascular stent placement    No pertinent past medical history     Past Surgical History:  Procedure Laterality Date   LEFT HEART CATH N/A 02/02/2012   Procedure: LEFT HEART CATH;  Surgeon: Clent Demark, MD;  Location: [facility] CATH LAB;  Service: Cardiovascular;  Laterality: N/A;   LEFT HEART CATHETERIZATION WITH CORONARY ANGIOGRAM N/A 05/10/2013   Procedure: LEFT HEART CATHETERIZATION WITH CORONARY ANGIOGRAM;  Surgeon: Sinclair Grooms, MD;  Location: [facility] CATH LAB;  Service: Cardiovascular;  Laterality: N/A;   NO PAST SURGERIES      There were no vitals filed for this visit.     03/13/21 1323  Symptoms/Limitations  Subjective No new complaint. No falls or pain to report.  Pertinent History PMH CAD, HTN, gout, hyperlipidemia  Patient Stated Goals Pt's goals for therapy are to stop the shuffling.  Pain Assessment  Currently in Pain? No/denies      03/13/21 1325  6 Minute Walk- Baseline  6 Minute Walk- Baseline y  BP (mmHg) 127/62  HR (bpm) 71  Modified Borg Scale for Dyspnea 0-  Nothing at all  Perceived Rate of Exertion (Borg) 6-  6 Minute walk- Post Test  6 Minute Walk Post Test y  BP (mmHg) 159/71  HR (bpm) 77  Modified Borg Scale for Dyspnea 0- Nothing at all  Perceived Rate of Exertion (Borg) 6-  6 minute walk test results   Aerobic Endurance Distance Walked 1572  Endurance additional comments no AD used  Functional Gait  Assessment  Gait assessed  Yes  Gait Level Surface 3 (4.69)  Change in Gait Speed 3  Gait with Horizontal Head Turns 3  Gait with Vertical Head Turns 3  Gait and Pivot Turn 3  Step Over Obstacle 3  Gait with Narrow Base of Support 3  Gait with Eyes Closed 3 (6.10 sec's)  Ambulating Backwards 3 (9.60)  Steps 3  Total Score 30  FGA comment: 30/30      03/13/21 1325  Transfers  Transfers Sit to Stand;Stand to Sit  Sit to Stand 6: Modified independent (Device/Increase time)  Stand to Sit 6: Modified independent (Device/Increase time);Without upper extremity assist;To chair/3-in-1  Ambulation/Gait  Ambulation/Gait Yes  Ambulation/Gait Assistance 6: Modified independent (Device/Increase time)  Assistive device None  Gait Pattern Step-through pattern;Decreased step length - left;Poor foot clearance - left  Ambulation Surface Level;Indoor  Self-Care  Self-Care Other Self-Care Comments  Other Self-Care Comments  Administered FOTO CVA LE survey with pt's score 98 (improved from 62)         PT Long Term  Goals - 03/13/21 1324       PT LONG TERM GOAL #1   Title Pt will be independent with HEP for improved balance and gait.  TARGET 03/27/2021    Baseline 03/13/21: met with current program    Status Achieved      PT LONG TERM GOAL #2   Title Pt will improve FGA score to at least 27/30 to decrease fall risk.    Baseline 03/13/21: 30/30 scored today    Status Achieved      PT LONG TERM GOAL #3   Title 6MWT to be assessed and goal to be written as appropriate. 03/04/21 Reassess at DC to monitor BP    Baseline 03/13/21: 1572  feet with VVS, improved from 1125 feet at last assessment on 03/04/21    Time --    Period --    Status Achieved      PT LONG TERM GOAL #4   Title Pt will negotiate at least 12 steps, using rail, with step through pattern, no LOB, mod I for improved stair negotiation for home.    Baseline 03/13/21: met with stairs in FGA + 3 more reps    Status Achieved      PT LONG TERM GOAL #5   Title Pt will ambulate at least 1000 ft indoor and outdoor surfaces, independently for improved return to walking for exercise in community.    Baseline 03/13/21: met today    Status Achieved      PT LONG TERM GOAL #6   Title Pt will improve FOTO score to at least 78 to demonstrate improved percieved functional mobility, at discharge.    Baseline 03/13/21: score today 98 (improved from 65)    Status Achieved              03/13/21 1324  Plan  Clinical Impression Statement Today's skilled session focused on progress toward LTGs due to significant progress. Pt met all goals with FGA score of 30/30, no issues noted with gait for distances on compliant surfaces, improved 6 minute walk distance and improved FOTO score to 98 from 65. Patient agreeable to discharge today. Primary PT aware of progress and agreeable to discharge.  Personal Factors and Comorbidities Comorbidity 3+  Comorbidities PMH CAD, HTN, gout, hyperlipidemia  Examination-Activity Limitations Locomotion Level;Stairs  Examination-Participation Restrictions Community Activity;Other (Exercise/walking in neighborhood-1-3 miles/day)  Pt will benefit from skilled therapeutic intervention in order to improve on the following deficits Abnormal gait;Decreased coordination;Difficulty walking;Impaired perceived functional ability;Decreased balance  Stability/Clinical Decision Making Evolving/Moderate complexity  Rehab Potential Good  PT Frequency Other (comment) (1x/wk (including eval), then 2x/wk for 4 weeks)  PT Duration Other (comment) (5 weeks total  POC)  PT Treatment/Interventions ADLs/Self Care Home Management;Gait training;Stair training;Functional mobility training;Therapeutic activities;Therapeutic exercise;Balance training;Neuromuscular re-education;Patient/family education  PT Next Visit Plan discharge with goals met  PT [place] and Agree with Plan of Care Patient;Family member/caregiver  Family Member Consulted daughter          Patient will benefit from skilled therapeutic intervention in order to improve the following deficits and impairments:  Abnormal gait, Decreased coordination, Difficulty walking, Impaired perceived functional ability, Decreased balance  Visit Diagnosis: Unsteadiness on feet  Other abnormalities of gait and mobility  Muscle weakness (generalized)     Problem List Patient Active Problem List   Diagnosis Date Noted   Acute CVA (cerebrovascular accident) ([place]) 02/15/2021   Gout 02/15/2021   AKI (acute kidney injury) ([place]) 02/15/2021   Hyperlipidemia  10/17/2013   Coronary stent restenosis due to progression of disease 05/10/2013   Coronary atherosclerosis of native coronary artery 05/10/2013   Myocardial infarction, anterolateral wall, acute ([place]) 02/03/2012   Willow Ora, PTA, [facility] [address], [place], [place] [address] [phone] 03/17/21, 10:32 AM    Name: Philip Parsons MRN: [id number] Date of Birth: [birth year]

## 2021-03-17 ENCOUNTER — Ambulatory Visit: Payer: Medicare Other

## 2021-03-19 ENCOUNTER — Ambulatory Visit: Payer: Medicare Other

## 2021-03-19 ENCOUNTER — Encounter: Payer: Self-pay | Admitting: Physical Therapy

## 2021-03-19 NOTE — Therapy (Signed)
[facility] [address] [place], [place], [address] Phone: [phone]   Fax:  [phone]  Patient Details  Name: Philip Parsons MRN: [id number] Date of Birth: January 25, 1941 Referring Provider:  No ref. provider found  Encounter Date: 03/19/2021  PHYSICAL THERAPY DISCHARGE SUMMARY  Visits from Start of Care: 4  Current functional level related to goals / functional outcomes:  PT Long Term Goals - 03/13/21 1324       PT LONG TERM GOAL #1   Title Pt will be independent with HEP for improved balance and gait.  TARGET 03/27/2021    Baseline 03/13/21: met with current program    Status Achieved      PT LONG TERM GOAL #2   Title Pt will improve FGA score to at least 27/30 to decrease fall risk.    Baseline 03/13/21: 30/30 scored today    Status Achieved      PT LONG TERM GOAL #3   Title 6MWT to be assessed and goal to be written as appropriate. 03/04/21 Reassess at DC to monitor BP    Baseline 03/13/21: 1572 feet with VVS, improved from 1125 feet at last assessment on 03/04/21    Time --    Period --    Status Achieved      PT LONG TERM GOAL #4   Title Pt will negotiate at least 12 steps, using rail, with step through pattern, no LOB, mod I for improved stair negotiation for home.    Baseline 03/13/21: met with stairs in FGA + 3 more reps    Status Achieved      PT LONG TERM GOAL #5   Title Pt will ambulate at least 1000 ft indoor and outdoor surfaces, independently for improved return to walking for exercise in community.    Baseline 03/13/21: met today    Status Achieved      PT LONG TERM GOAL #6   Title Pt will improve FOTO score to at least 78 to demonstrate improved percieved functional mobility, at discharge.    Baseline 03/13/21: score today 98 (improved from 65)    Status Achieved            Pt has met all LTGs   Remaining deficits: Balance and gait has returned to [facility] at discharge   Education /  Equipment: Educated in HEP   Patient agrees to discharge. Patient goals were met. Patient is being discharged due to meeting the stated rehab goals.    Devynn Scheff W. 03/19/2021, 8:16 AM Frazier Butt PT   [facility] [address] [place], [place], [address] Phone: [phone]   Fax:  [phone]

## 2021-03-24 ENCOUNTER — Ambulatory Visit: Payer: Medicare Other

## 2021-03-25 ENCOUNTER — Inpatient Hospital Stay (HOSPITAL_COMMUNITY)
Admission: EM | Admit: 2021-03-25 | Discharge: 2021-03-27 | DRG: 250 | Disposition: A | Discharge status: Home / Self Care | Payer: Medicare Other | Attending: Cardiovascular Disease | Admitting: Cardiovascular Disease

## 2021-03-25 ENCOUNTER — Inpatient Hospital Stay (HOSPITAL_COMMUNITY)
Admission: EM | Disposition: A | Discharge status: Home / Self Care | Payer: Self-pay | Source: Home / Self Care | Attending: Cardiology

## 2021-03-25 ENCOUNTER — Ambulatory Visit (HOSPITAL_COMMUNITY): Admit: 2021-03-25 | Payer: Medicare Other | Admitting: Cardiology

## 2021-03-25 DIAGNOSIS — R9431 Abnormal electrocardiogram [ECG] [EKG]: Secondary | ICD-10-CM | POA: Diagnosis not present

## 2021-03-25 DIAGNOSIS — R7303 Prediabetes: Secondary | ICD-10-CM | POA: Diagnosis present

## 2021-03-25 DIAGNOSIS — R001 Bradycardia, unspecified: Secondary | ICD-10-CM | POA: Diagnosis present

## 2021-03-25 DIAGNOSIS — I2510 Atherosclerotic heart disease of native coronary artery without angina pectoris: Secondary | ICD-10-CM | POA: Diagnosis not present

## 2021-03-25 DIAGNOSIS — I358 Other nonrheumatic aortic valve disorders: Secondary | ICD-10-CM | POA: Diagnosis not present

## 2021-03-25 DIAGNOSIS — Z9861 Coronary angioplasty status: Secondary | ICD-10-CM

## 2021-03-25 DIAGNOSIS — Z8673 Personal history of transient ischemic attack (TIA), and cerebral infarction without residual deficits: Secondary | ICD-10-CM | POA: Diagnosis not present

## 2021-03-25 DIAGNOSIS — I2109 ST elevation (STEMI) myocardial infarction involving other coronary artery of anterior wall: Secondary | ICD-10-CM | POA: Diagnosis present

## 2021-03-25 DIAGNOSIS — I129 Hypertensive chronic kidney disease with stage 1 through stage 4 chronic kidney disease, or unspecified chronic kidney disease: Secondary | ICD-10-CM | POA: Diagnosis present

## 2021-03-25 DIAGNOSIS — E782 Mixed hyperlipidemia: Secondary | ICD-10-CM | POA: Diagnosis not present

## 2021-03-25 DIAGNOSIS — T82867A Thrombosis of cardiac prosthetic devices, implants and grafts, initial encounter: Secondary | ICD-10-CM | POA: Diagnosis present

## 2021-03-25 DIAGNOSIS — I97190 Other postprocedural cardiac functional disturbances following cardiac surgery: Principal | ICD-10-CM | POA: Diagnosis present

## 2021-03-25 DIAGNOSIS — Y831 Surgical operation with implant of artificial internal device as the cause of abnormal reaction of the patient, or of later complication, without mention of misadventure at the time of the procedure: Secondary | ICD-10-CM | POA: Diagnosis present

## 2021-03-25 DIAGNOSIS — N1831 Chronic kidney disease, stage 3a: Secondary | ICD-10-CM | POA: Diagnosis not present

## 2021-03-25 DIAGNOSIS — Z79899 Other long term (current) drug therapy: Secondary | ICD-10-CM | POA: Diagnosis not present

## 2021-03-25 DIAGNOSIS — R0789 Other chest pain: Secondary | ICD-10-CM | POA: Diagnosis not present

## 2021-03-25 DIAGNOSIS — Z886 Allergy status to analgesic agent status: Secondary | ICD-10-CM

## 2021-03-25 DIAGNOSIS — T82855A Stenosis of coronary artery stent, initial encounter: Secondary | ICD-10-CM

## 2021-03-25 DIAGNOSIS — Z7982 Long term (current) use of aspirin: Secondary | ICD-10-CM | POA: Diagnosis not present

## 2021-03-25 DIAGNOSIS — I2102 ST elevation (STEMI) myocardial infarction involving left anterior descending coronary artery: Secondary | ICD-10-CM | POA: Diagnosis not present

## 2021-03-25 DIAGNOSIS — Z8249 Family history of ischemic heart disease and other diseases of the circulatory system: Secondary | ICD-10-CM | POA: Diagnosis not present

## 2021-03-25 DIAGNOSIS — I21A9 Other myocardial infarction type: Secondary | ICD-10-CM | POA: Diagnosis present

## 2021-03-25 DIAGNOSIS — Z20822 Contact with and (suspected) exposure to covid-19: Secondary | ICD-10-CM | POA: Diagnosis not present

## 2021-03-25 DIAGNOSIS — E785 Hyperlipidemia, unspecified: Secondary | ICD-10-CM | POA: Diagnosis present

## 2021-03-25 DIAGNOSIS — I213 ST elevation (STEMI) myocardial infarction of unspecified site: Secondary | ICD-10-CM | POA: Diagnosis not present

## 2021-03-25 DIAGNOSIS — R079 Chest pain, unspecified: Secondary | ICD-10-CM | POA: Diagnosis not present

## 2021-03-25 HISTORY — PX: CORONARY/GRAFT ACUTE MI REVASCULARIZATION: CATH118305

## 2021-03-25 HISTORY — PX: LEFT HEART CATH AND CORONARY ANGIOGRAPHY: CATH118249

## 2021-03-25 LAB — LIPID PANEL
Cholesterol: 137 mg/dL (ref 0–200)
HDL: 52 mg/dL (ref >40)
LDL Cholesterol: 79 mg/dL (ref 0–99)
Total CHOL/HDL Ratio: 2.6 RATIO
Triglycerides: 31 mg/dL (ref <150)
VLDL: 6 mg/dL (ref 0–40)

## 2021-03-25 LAB — CBC WITH DIFFERENTIAL/PLATELET
Abs Immature Granulocytes: 0.01 10*3/uL (ref 0.00–0.07)
Basophils Absolute: 0 10*3/uL (ref 0.0–0.1)
Basophils Relative: 0 %
Eosinophils Absolute: 0.2 10*3/uL (ref 0.0–0.5)
Eosinophils Relative: 3 %
HCT: 36 % — ABNORMAL LOW (ref 39.0–52.0)
Hemoglobin: 11.8 g/dL — ABNORMAL LOW (ref 13.0–17.0)
Immature Granulocytes: 0 %
Lymphocytes Relative: 28 %
Lymphs Abs: 2.3 10*3/uL (ref 0.7–4.0)
MCH: 29.8 pg (ref 26.0–34.0)
MCHC: 32.8 g/dL (ref 30.0–36.0)
MCV: 90.9 fL (ref 80.0–100.0)
Monocytes Absolute: 0.9 10*3/uL (ref 0.1–1.0)
Monocytes Relative: 11 %
Neutro Abs: 4.8 10*3/uL (ref 1.7–7.7)
Neutrophils Relative %: 58 %
Platelets: 295 10*3/uL (ref 150–400)
RBC: 3.96 MIL/uL — ABNORMAL LOW (ref 4.22–5.81)
RDW: 12.2 % (ref 11.5–15.5)
WBC: 8.2 10*3/uL (ref 4.0–10.5)
nRBC: 0 % (ref 0.0–0.2)

## 2021-03-25 LAB — COMPREHENSIVE METABOLIC PANEL
ALT: 17 U/L (ref 0–44)
AST: 22 U/L (ref 15–41)
Albumin: 3 g/dL — ABNORMAL LOW (ref 3.5–5.0)
Alkaline Phosphatase: 53 U/L (ref 38–126)
Anion gap: 8 (ref 5–15)
BUN: 25 mg/dL — ABNORMAL HIGH (ref 8–23)
CO2: 23 mmol/L (ref 22–32)
Calcium: 8.5 mg/dL — ABNORMAL LOW (ref 8.9–10.3)
Chloride: 105 mmol/L (ref 98–111)
Creatinine, Ser: 1.33 mg/dL — ABNORMAL HIGH (ref 0.61–1.24)
GFR, Estimated: 53 mL/min — ABNORMAL LOW (ref >60)
Glucose, Bld: 244 mg/dL — ABNORMAL HIGH (ref 70–99)
Potassium: 3.4 mmol/L — ABNORMAL LOW (ref 3.5–5.1)
Sodium: 136 mmol/L (ref 135–145)
Total Bilirubin: 0.8 mg/dL (ref 0.3–1.2)
Total Protein: 6.2 g/dL — ABNORMAL LOW (ref 6.5–8.1)

## 2021-03-25 LAB — TROPONIN I (HIGH SENSITIVITY)
Troponin I (High Sensitivity): 131 ng/L (ref <18)
Troponin I (High Sensitivity): 1329 ng/L (ref <18)
Troponin I (High Sensitivity): 4938 ng/L (ref <18)

## 2021-03-25 LAB — POCT I-STAT, CHEM 8
BUN: 25 mg/dL — ABNORMAL HIGH (ref 8–23)
Calcium, Ion: 1.19 mmol/L (ref 1.15–1.40)
Chloride: 105 mmol/L (ref 98–111)
Creatinine, Ser: 1.3 mg/dL — ABNORMAL HIGH (ref 0.61–1.24)
Glucose, Bld: 244 mg/dL — ABNORMAL HIGH (ref 70–99)
HCT: 34 % — ABNORMAL LOW (ref 39.0–52.0)
Hemoglobin: 11.6 g/dL — ABNORMAL LOW (ref 13.0–17.0)
Potassium: 3.6 mmol/L (ref 3.5–5.1)
Sodium: 141 mmol/L (ref 135–145)
TCO2: 25 mmol/L (ref 22–32)

## 2021-03-25 LAB — POCT ACTIVATED CLOTTING TIME: Activated Clotting Time: 254 seconds

## 2021-03-25 LAB — RESP PANEL BY RT-PCR (FLU A&B, COVID) ARPGX2
Influenza A by PCR: NEGATIVE
Influenza B by PCR: NEGATIVE
SARS Coronavirus 2 by RT PCR: NEGATIVE

## 2021-03-25 LAB — PROTIME-INR
INR: 1 (ref 0.8–1.2)
Prothrombin Time: 13.5 seconds (ref 11.4–15.2)

## 2021-03-25 LAB — APTT: aPTT: 35 seconds (ref 24–36)

## 2021-03-25 SURGERY — LEFT HEART CATH AND CORONARY ANGIOGRAPHY
Anesthesia: LOCAL

## 2021-03-25 MED ORDER — IOHEXOL 350 MG/ML SOLN
INTRAVENOUS | Status: DC | PRN
  Administered 2021-03-25: 105 mL

## 2021-03-25 MED ORDER — ONDANSETRON HCL 4 MG/2ML IJ SOLN: 4.0000 mg | Freq: Four times a day (QID) | INTRAMUSCULAR | Status: DC | PRN

## 2021-03-25 MED ORDER — LIDOCAINE HCL (PF) 1 % IJ SOLN
INTRAMUSCULAR | Status: AC
  Filled 2021-03-25: qty 30

## 2021-03-25 MED ORDER — VERAPAMIL HCL 2.5 MG/ML IV SOLN
INTRAVENOUS | Status: AC
  Filled 2021-03-25: qty 2

## 2021-03-25 MED ORDER — HYDRALAZINE HCL 20 MG/ML IJ SOLN: 10.0000 mg | INTRAMUSCULAR | Status: AC | PRN

## 2021-03-25 MED ORDER — SODIUM CHLORIDE 0.9 % IV SOLN: 250.0000 mL | INTRAVENOUS | Status: DC | PRN

## 2021-03-25 MED ORDER — SODIUM CHLORIDE 0.9% FLUSH: 3.0000 mL | INTRAVENOUS | Status: DC | PRN

## 2021-03-25 MED ORDER — VERAPAMIL HCL 2.5 MG/ML IV SOLN
INTRAVENOUS | Status: DC | PRN
  Administered 2021-03-25: 10 mL via INTRA_ARTERIAL

## 2021-03-25 MED ORDER — ASPIRIN EC 81 MG PO TBEC
81.0000 mg | DELAYED_RELEASE_TABLET | Freq: Every day | ORAL | Status: DC
  Administered 2021-03-26 – 2021-03-27 (×2): 81 mg via ORAL
  Filled 2021-03-25 (×2): qty 1

## 2021-03-25 MED ORDER — HEPARIN (PORCINE) IN NACL 1000-0.9 UT/500ML-% IV SOLN
INTRAVENOUS | Status: DC | PRN
  Administered 2021-03-25 (×2): 500 mL

## 2021-03-25 MED ORDER — CLOPIDOGREL BISULFATE 75 MG PO TABS
75.0000 mg | ORAL_TABLET | Freq: Every day | ORAL | Status: DC
  Administered 2021-03-26: 75 mg via ORAL
  Filled 2021-03-25: qty 1

## 2021-03-25 MED ORDER — SODIUM CHLORIDE 0.9 % IV SOLN
INTRAVENOUS | Status: AC | PRN
  Administered 2021-03-25: 10 mL/h via INTRAVENOUS

## 2021-03-25 MED ORDER — ATORVASTATIN CALCIUM 40 MG PO TABS
40.0000 mg | ORAL_TABLET | Freq: Every day | ORAL | Status: DC
  Administered 2021-03-26 – 2021-03-27 (×2): 40 mg via ORAL
  Filled 2021-03-25 (×2): qty 1

## 2021-03-25 MED ORDER — LIDOCAINE HCL (PF) 1 % IJ SOLN
INTRAMUSCULAR | Status: DC | PRN
  Administered 2021-03-25: 2 mL

## 2021-03-25 MED ORDER — FENTANYL CITRATE (PF) 100 MCG/2ML IJ SOLN
INTRAMUSCULAR | Status: DC | PRN
  Administered 2021-03-25 (×2): 25 ug via INTRAVENOUS

## 2021-03-25 MED ORDER — HEPARIN SODIUM (PORCINE) 1000 UNIT/ML IJ SOLN
INTRAMUSCULAR | Status: AC
  Filled 2021-03-25: qty 1

## 2021-03-25 MED ORDER — SODIUM CHLORIDE 0.9 % WEIGHT BASED INFUSION
1.0000 mL/kg/h | INTRAVENOUS | Status: AC
  Administered 2021-03-25: 1 mL/kg/h via INTRAVENOUS

## 2021-03-25 MED ORDER — SODIUM CHLORIDE 0.9% FLUSH
3.0000 mL | Freq: Two times a day (BID) | INTRAVENOUS | Status: DC
  Administered 2021-03-26 – 2021-03-27 (×3): 3 mL via INTRAVENOUS

## 2021-03-25 MED ORDER — LATANOPROST 0.005 % OP SOLN
1.0000 [drp] | Freq: Every day | OPHTHALMIC | Status: DC
  Administered 2021-03-26: 1 [drp] via OPHTHALMIC
  Filled 2021-03-25: qty 2.5

## 2021-03-25 MED ORDER — HEPARIN (PORCINE) IN NACL 1000-0.9 UT/500ML-% IV SOLN
INTRAVENOUS | Status: AC
  Filled 2021-03-25: qty 1000

## 2021-03-25 MED ORDER — NITROGLYCERIN 0.4 MG SL SUBL: 0.4000 mg | SUBLINGUAL_TABLET | SUBLINGUAL | Status: DC | PRN

## 2021-03-25 MED ORDER — FENTANYL CITRATE (PF) 100 MCG/2ML IJ SOLN
INTRAMUSCULAR | Status: AC
  Filled 2021-03-25: qty 2

## 2021-03-25 MED ORDER — MAGNESIUM OXIDE -MG SUPPLEMENT 400 (240 MG) MG PO TABS
200.0000 mg | ORAL_TABLET | Freq: Every day | ORAL | Status: DC
  Administered 2021-03-26 – 2021-03-27 (×2): 200 mg via ORAL
  Filled 2021-03-25 (×2): qty 1

## 2021-03-25 MED ORDER — LABETALOL HCL 5 MG/ML IV SOLN: 10.0000 mg | INTRAVENOUS | Status: AC | PRN

## 2021-03-25 MED ORDER — ACETAMINOPHEN 325 MG PO TABS: 650.0000 mg | ORAL_TABLET | ORAL | Status: DC | PRN

## 2021-03-25 MED ORDER — CLOPIDOGREL BISULFATE 300 MG PO TABS
ORAL_TABLET | ORAL | Status: AC
  Filled 2021-03-25: qty 1

## 2021-03-25 MED ORDER — HEPARIN SODIUM (PORCINE) 1000 UNIT/ML IJ SOLN
INTRAMUSCULAR | Status: DC | PRN
  Administered 2021-03-25: 6000 [IU] via INTRAVENOUS
  Administered 2021-03-25: 2000 [IU] via INTRAVENOUS

## 2021-03-25 SURGICAL SUPPLY — 16 items
BALLN SAPPHIRE 3.0X15 (BALLOONS) ×2
BALLN SAPPHIRE ~~LOC~~ 3.75X15 (BALLOONS) ×1 IMPLANT
BALLOON SAPPHIRE 3.0X15 (BALLOONS) IMPLANT
CATH OPTITORQUE TIG 4.0 5F (CATHETERS) ×1 IMPLANT
CATH VISTA GUIDE 6FR XBLAD3.5 (CATHETERS) ×2 IMPLANT
DEVICE RAD COMP TR BAND LRG (VASCULAR PRODUCTS) ×1 IMPLANT
GLIDESHEATH SLEND SS 6F .021 (SHEATH) ×1 IMPLANT
KIT ENCORE 26 ADVANTAGE (KITS) ×2 IMPLANT
KIT HEART LEFT (KITS) ×2 IMPLANT
PACK CARDIAC CATHETERIZATION (CUSTOM PROCEDURE TRAY) ×2 IMPLANT
SHEATH PROBE COVER 6X72 (BAG) ×1 IMPLANT
TRANSDUCER W/STOPCOCK (MISCELLANEOUS) ×2 IMPLANT
TUBING CIL FLEX 10 FLL-RA (TUBING) ×2 IMPLANT
WIRE ASAHI PROWATER 180CM (WIRE) ×1 IMPLANT
WIRE EMERALD 3MM-J .035X260CM (WIRE) ×1 IMPLANT
WIRE HI TORQ VERSACORE-J 145CM (WIRE) ×1 IMPLANT

## 2021-03-25 NOTE — H&P (Addendum)
Cardiology Admission History and Physical:   Patient ID: Philip Parsons MRN: [id number]; DOB: 1941-02-17   Admission date: 03/25/2021  PCP:  Blair Heys, MD   [facility] Providers Cardiologist:  Lesleigh Noe, MD   Chief Complaint:  Chest pain/STEMI  Patient Profile:   Philip Parsons is a 80 y.o. male with PMH of CAD s/p LAD stenting '13, HTN, HLD who is being seen 03/25/2021 for the evaluation of chest pain/STEMI.  History of Present Illness:   Philip Parsons is an 80 yo male with PMH of CAD s/p LAD stenting back in 2013. He has been followed by Dr. Katrinka Blazing as an outpatient. He was last seen in the office on 3/22 and reported being in his usual state of health. Noted indicate he expressly wished not to be on statin lowering therapy on multiple occassions. He was most recently admitted on 02/15/21-02/17/21 with acute right thalamic infarct. He underwent neurology work up with recommendations for ASA/plavix for 3 weeks, then asa alone. Seen by PT with recommendations for HHPT. Noted indicate a hx of retinal ischemia which was discussed with his primary ophthalmologist, Dr. Luberta Mutter who was ok with ASA/plavix. Echo showed EF of 65-70% with G1DD.  On 6/29 he developed acute onset of chest pain around 12pm while he was eating lunch. EMS called. On arrival EKG showed SR with acute ST elevation in anteroseptal leads. CODE STEMI called in the field. Given 324 ASA and 3 SL nitro en route.   On arrival he was taken directly to the cath lab for emergent cardiac cath. Still with active chest pain on arrival to the cath lab.    Past Medical History:  Diagnosis Date   Coronary artery disease    History of intravascular stent placement    No pertinent past medical history     Past Surgical History:  Procedure Laterality Date   LEFT HEART CATH N/A 02/02/2012   Procedure: LEFT HEART CATH;  Surgeon: Robynn Pane, MD;  Location: [facility] CATH LAB;  Service: Cardiovascular;  Laterality: N/A;   LEFT HEART  CATHETERIZATION WITH CORONARY ANGIOGRAM N/A 05/10/2013   Procedure: LEFT HEART CATHETERIZATION WITH CORONARY ANGIOGRAM;  Surgeon: Lesleigh Noe, MD;  Location: [facility] CATH LAB;  Service: Cardiovascular;  Laterality: N/A;   NO PAST SURGERIES       Medications Prior to Admission: Prior to Admission medications   Medication Sig Start Date End Date Taking? Authorizing Provider  Ascorbic Acid (VITAMIN C PO) Take 1 tablet by mouth daily.    [provider]  aspirin EC 81 MG tablet Take 1 tablet (81 mg total) by mouth daily. Swallow whole. 02/17/21 02/17/22  Pokhrel, Rebekah Chesterfield, MD  atorvastatin (LIPITOR) 40 MG tablet Take 1 tablet (40 mg total) by mouth daily. 02/17/21 02/17/22  Pokhrel, Rebekah Chesterfield, MD  Coenzyme Q10 (COQ10 PO) Take 1 capsule by mouth daily.    [provider]  latanoprost (XALATAN) 0.005 % ophthalmic solution Place 1 drop into the left eye at bedtime. 01/20/21   [provider]  MAGNESIUM PO Take 1 tablet by mouth daily.    [provider]  nitroGLYCERIN (NITROSTAT) 0.4 MG SL tablet PLACE 1 TABLET UNDER THE TONGUE EVERY 5 MINUTES AS NEEDED FOR CHEST PAIN Patient taking differently: Place 0.4 mg under the tongue every 5 (five) minutes as needed for chest pain. 12/09/20   Lyn Records, MD  VITAMIN D PO Take 1 tablet by mouth daily.    [provider]  Allergies:    Allergies  Allergen Reactions   Meloxicam Other (See Comments)    Feels bad (reported by [facility] 2021)    Social History:   Social History   Socioeconomic History   Marital status: Married    Spouse name: Not on file   Number of children: Not on file   Years of education: Not on file   Highest education level: Not on file  Occupational History   Not on file  Tobacco Use   Smoking status: Former    Pack years: 0.00   Smokeless tobacco: Never  Substance and Sexual Activity   Alcohol use: No    Alcohol/week: 0.0 standard drinks   Drug use: No   Sexual activity:  Yes  Other Topics Concern   Not on file  Social History Narrative   Not on file   Social Determinants of Health   Financial Resource Strain: Not on file  Food Insecurity: Not on file  Transportation Needs: Not on file  Physical Activity: Not on file  Stress: Not on file  Social Connections: Not on file  Intimate Partner Violence: Not on file    Family History:   The patient's family history includes Arthritis in his mother; Healthy in his father; Heart disease in his sister.    ROS:  Please see the history of present illness.  All other ROS reviewed and negative.     Physical Exam/Data:   Vitals:   03/25/21 1300  Weight: 74.8 kg  Height: 5\' 9"  (1.753 m)   No intake or output data in the 24 hours ending 03/25/21 1404 Last 3 Weights 03/25/2021 02/15/2021 12/11/2020  Weight (lbs) 165 lb 165 lb 161 lb  Weight (kg) 74.844 kg 74.844 kg 73.029 kg     Body mass index is 24.37 kg/m.  General:  Older male -in notable distress, 8/10 chest pain;  HEENT: normal Lymph: no adenopathy Neck: no JVD Endocrine:  No thryomegaly Vascular: No carotid bruits; FA pulses 2+ bilaterally without bruits  Cardiac:  normal S1, S2; RRR; no murmur  Lungs:  clear to auscultation bilaterally, no wheezing, rhonchi or rales  Abd: soft, nontender, no hepatomegaly  Ext: no edema Musculoskeletal:  No deformities, BUE and BLE strength normal and equal Skin: warm and dry  Neuro:  CNs 2-12 intact, no focal abnormalities noted -> he claims he has no residual side effect from his recent stroke Psych:  Normal affect    EKG:  The ECG that was done 6/29 was personally reviewed and demonstrates SR with 2 to 3 mm ST elevation in anteroseptal leads.  Relevant CV Studies:  Echo: 02/16/21  IMPRESSIONS     1. Left ventricular ejection fraction, by estimation, is 65 to 70%. The  left ventricle has normal function. The left ventricle has no regional  wall motion abnormalities. There is mild left ventricular  hypertrophy.  Left ventricular diastolic parameters  are consistent with Grade I diastolic dysfunction (impaired relaxation).   2. Right ventricular systolic function is normal. The right ventricular  size is normal. Tricuspid regurgitation signal is inadequate for assessing  PA pressure.   3. The mitral valve is normal in structure. Trivial mitral valve  regurgitation. No evidence of mitral stenosis.   4. The aortic valve is tricuspid. Aortic valve regurgitation is not  visualized. Mild to moderate aortic valve sclerosis/calcification is  present, without any evidence of aortic stenosis.   5. The inferior vena cava is normal in size with greater than 50%  respiratory variability, suggesting right atrial pressure of 3 mmHg.   Laboratory Data:  High Sensitivity Troponin:  No results for input(s): TROPONINIHS in the last 720 hours.    ChemistryNo results for input(s): NA, K, CL, CO2, GLUCOSE, BUN, CREATININE, CALCIUM, GFRNONAA, GFRAA, ANIONGAP in the last 168 hours.  No results for input(s): PROT, ALBUMIN, AST, ALT, ALKPHOS, BILITOT in the last 168 hours. HematologyNo results for input(s): WBC, RBC, HGB, HCT, MCV, MCH, MCHC, RDW, PLT in the last 168 hours. BNPNo results for input(s): BNP, PROBNP in the last 168 hours.  DDimer No results for input(s): DDIMER in the last 168 hours.   Radiology/Studies:  No results found.   Assessment and Plan:   Philip Parsons is a 80 y.o. male with PMH of CAD s/p LAD stenting '13, HTN, HLD who is being seen 03/25/2021 for the evaluation of chest pain/STEMI.  Anterior STEMI: hx of stenting to the LAD '13. Developed onset of chest pain today around 12pm while eating lunch. EKG in the field showed anteroseptal ST elevation. STEMI called, given 324 ASA and 3 SL nitro. Brought directly to the cath lab for emergent cardiac cath. Further recommendations post cath. -- anticipate routine post MI care -- check echo  HTN: stable without medication at  present  HLD: LDL 156 (5/22) historically has not wanted to be on medications per notes from Dr. Katrinka Blazing -- will need to revisit this issue, needs to be on statin -- appears he was discharged on Lipitor 40mg  daily from CVA  Recent CVA: neurology work up 5/22-5/24 with recommendations for ASA/plavix.    Risk Assessment/Risk Scores:   TIMI Risk Score for ST  Elevation MI:   The patient's TIMI risk score is 5, which indicates a 12.4% risk of all cause mortality at 30 days. {  Severity of Illness: The appropriate patient status for this patient is INPATIENT. Inpatient status is judged to be reasonable and necessary in order to provide the required intensity of service to ensure the patient's safety. The patient's presenting symptoms, physical exam findings, and initial radiographic and laboratory data in the context of their chronic comorbidities is felt to place them at high risk for further clinical deterioration. Furthermore, it is not anticipated that the patient will be medically stable for discharge from the hospital within 2 midnights of admission. The following factors support the patient status of inpatient.   " The patient's presenting symptoms include chest pain. " The worrisome physical exam findings include stable exam. " The initial radiographic and laboratory data are worrisome because of EKG showing ST elevation in anteroseptal leads. " The chronic co-morbidities include CAD, HTN, HLD.   * I certify that at the point of admission it is my clinical judgment that the patient will require inpatient hospital care spanning beyond 2 midnights from the point of admission due to high intensity of service, high risk for further deterioration and high frequency of surveillance required.*   For questions or updates, please contact [facility] Please consult [email or web address] for contact info under     Signed, 02-01-1974, NP  03/25/2021 2:04 PM   ATTENDING ATTESTATION  I have  seen, examined and evaluated the patient this afternoon along with 03/27/2021, NP.  After reviewing all the available data and chart, we discussed the patients laboratory, study & physical findings as well as symptoms in detail. I agree with her findings, examination as well as impression recommendations as per our discussion.    Physical exam and plan  addended per our discussion.  Presented with acute anterior STEMI and found to have acute stent thrombosis of the ostial LAD stent treated with angioplasty only.  Thankfully, he was already on aspirin and Plavix-previous question of taking medications is essentially now a nonissue with him recently having a stroke.  He will continue on aspirin and Plavix for least 1 year based on ACS protocol, but if there is bruising potentially could stop aspirin after 6 to 9 months.  He has been reluctant to take any other medications, but was discharged on Lipitor on last discharge.  We will continue as long as he was willing to take it.  Anticipate relatively short discharge pending results of echocardiogram.   Bryan Lemma, M.D., M.S. Interventional Cardiologist   Pager # [phone] Phone # [phone] [address] [place], [place] [address]

## 2021-03-26 ENCOUNTER — Other Ambulatory Visit (HOSPITAL_COMMUNITY): Payer: Self-pay

## 2021-03-26 ENCOUNTER — Other Ambulatory Visit: Payer: Self-pay

## 2021-03-26 ENCOUNTER — Inpatient Hospital Stay (HOSPITAL_COMMUNITY): Payer: Medicare Other

## 2021-03-26 ENCOUNTER — Encounter (HOSPITAL_COMMUNITY): Payer: Self-pay | Admitting: Cardiology

## 2021-03-26 DIAGNOSIS — I2510 Atherosclerotic heart disease of native coronary artery without angina pectoris: Secondary | ICD-10-CM

## 2021-03-26 LAB — BASIC METABOLIC PANEL
Anion gap: 9 (ref 5–15)
BUN: 25 mg/dL — ABNORMAL HIGH (ref 8–23)
CO2: 24 mmol/L (ref 22–32)
Calcium: 8.2 mg/dL — ABNORMAL LOW (ref 8.9–10.3)
Chloride: 108 mmol/L (ref 98–111)
Creatinine, Ser: 1.31 mg/dL — ABNORMAL HIGH (ref 0.61–1.24)
GFR, Estimated: 54 mL/min — ABNORMAL LOW (ref >60)
Glucose, Bld: 91 mg/dL (ref 70–99)
Potassium: 3.5 mmol/L (ref 3.5–5.1)
Sodium: 141 mmol/L (ref 135–145)

## 2021-03-26 LAB — CBC
HCT: 32.8 % — ABNORMAL LOW (ref 39.0–52.0)
Hemoglobin: 10.9 g/dL — ABNORMAL LOW (ref 13.0–17.0)
MCH: 30.4 pg (ref 26.0–34.0)
MCHC: 33.2 g/dL (ref 30.0–36.0)
MCV: 91.6 fL (ref 80.0–100.0)
Platelets: 237 10*3/uL (ref 150–400)
RBC: 3.58 MIL/uL — ABNORMAL LOW (ref 4.22–5.81)
RDW: 12.3 % (ref 11.5–15.5)
WBC: 8.4 10*3/uL (ref 4.0–10.5)
nRBC: 0 % (ref 0.0–0.2)

## 2021-03-26 LAB — ECHOCARDIOGRAM COMPLETE
AR max vel: 2.42 cm2
AV Area VTI: 2.16 cm2
AV Area mean vel: 2.1 cm2
AV Mean grad: 5 mmHg
AV Peak grad: 9 mmHg
Ao pk vel: 1.5 m/s
Area-P 1/2: 3.85 cm2
Height: 69 in
S' Lateral: 2.6 cm
Weight: 2640 oz

## 2021-03-26 LAB — HEMOGLOBIN A1C
Hgb A1c MFr Bld: 6.1 % — ABNORMAL HIGH (ref 4.8–5.6)
Mean Plasma Glucose: 128 mg/dL

## 2021-03-26 MED ORDER — CHLORHEXIDINE GLUCONATE CLOTH 2 % EX PADS: 6.0000 | MEDICATED_PAD | Freq: Every day | CUTANEOUS | Status: DC

## 2021-03-26 MED ORDER — TICAGRELOR 90 MG PO TABS
90.0000 mg | ORAL_TABLET | Freq: Two times a day (BID) | ORAL | Status: DC
  Administered 2021-03-26 – 2021-03-27 (×2): 90 mg via ORAL
  Filled 2021-03-26 (×2): qty 1

## 2021-03-26 MED ORDER — METOPROLOL TARTRATE 12.5 MG HALF TABLET
12.5000 mg | ORAL_TABLET | Freq: Two times a day (BID) | ORAL | Status: DC
  Administered 2021-03-26: 12.5 mg via ORAL
  Filled 2021-03-26: qty 1

## 2021-03-26 MED ORDER — PERFLUTREN LIPID MICROSPHERE
1.0000 mL | INTRAVENOUS | Status: AC | PRN
  Administered 2021-03-26: 5 mL via INTRAVENOUS
  Filled 2021-03-26: qty 10

## 2021-03-26 MED FILL — Clopidogrel Bisulfate Tab 300 MG (Base Equiv): ORAL | Qty: 1 | Status: AC

## 2021-03-26 NOTE — Progress Notes (Signed)
CARDIAC REHAB PHASE I   PRE:  Rate/Rhythm: 56 SB    BP: lying 112/59    SaO2: 97 RA  MODE:  Ambulation: 370 ft   POST:  Rate/Rhythm: 82 SR    BP: sitting 133/67     SaO2: 99 RA  Tolerated well, no c/o. VSS although HR low at rest. Discussed MI, PTCA, Brilinta, restrictions, diet, exercise, NTG and CRPII. Pt receptive. Will refer to [place] CRPII.  Encouraged another walk. [id number]  Harriet Masson CES, ACSM 03/26/2021 11:25 AM

## 2021-03-26 NOTE — TOC Benefit Eligibility Note (Signed)
Patient Advocate Encounter  Insurance verification completed.    The patient is currently admitted and upon discharge could be taking Brilinta 90 mg.  The current 30 day co-pay is, $203.69.   The patient is insured through [employer]    Jilliam Bellmore, CPhT Pharmacy Patient Advocate Specialist [facility] Team Direct Number: [phone]  Fax: [phone]        

## 2021-03-26 NOTE — Progress Notes (Signed)
Progress Note  Patient Name: Philip Parsons Date of Encounter: 03/26/2021  [facility] Cardiologist: Lyn Records III, MD   Subjective   No chest pain or shortness of breath.  Inpatient Medications    Scheduled Meds:  aspirin EC  81 mg Oral Daily   atorvastatin  40 mg Oral Daily   Chlorhexidine Gluconate Cloth  6 each Topical Daily   clopidogrel  75 mg Oral Q breakfast   latanoprost  1 drop Left Eye QHS   magnesium oxide  200 mg Oral Daily   sodium chloride flush  3 mL Intravenous Q12H   Continuous Infusions:  sodium chloride Stopped (03/26/21 0328)   PRN Meds: sodium chloride, acetaminophen, nitroGLYCERIN, ondansetron (ZOFRAN) IV, sodium chloride flush   Vital Signs    Vitals:   03/26/21 0415 03/26/21 0500 03/26/21 0600 03/26/21 0748  BP: 124/66 (!) 117/57 (!) 108/43   Pulse: (!) 58 (!) 54 (!) 55   Resp: 17 13 12    Temp:    97.6 F (36.4 C)  TempSrc:    Oral  SpO2: 100% 97% 96%   Weight:      Height:        Intake/Output Summary (Last 24 hours) at 03/26/2021 0827 Last data filed at 03/26/2021 0600 Gross per 24 hour  Intake 928.77 ml  Output 275 ml  Net 653.77 ml   Last 3 Weights 03/25/2021 02/15/2021 12/11/2020  Weight (lbs) 165 lb 165 lb 161 lb  Weight (kg) 74.844 kg 74.844 kg 73.029 kg      Telemetry    Sinus rhythm no significant arrhythmia - Personally Reviewed  ECG    NSR with age indeterminate anteroseptal MI, ST-T consider anterior ischemia - Personally Reviewed  Physical Exam  Alert oriented elderly male in NAD GEN: No acute distress.   Neck: No JVD Cardiac: RRR, no murmurs, rubs, or gallops.  Respiratory: Clear to auscultation bilaterally. GI: Soft, nontender, non-distended  MS: No edema; No deformity. Right radial site clear Neuro:  Nonfocal  Psych: Normal affect   Labs    High Sensitivity Troponin:   Recent Labs  Lab 03/25/21 1329 03/25/21 1506 03/25/21 1756  TROPONINIHS 131* 1,329* 4,938*      Chemistry Recent Labs   Lab 03/25/21 1327 03/25/21 1329 03/26/21 0030  NA 141 136 141  K 3.6 3.4* 3.5  CL 105 105 108  CO2  --  23 24  GLUCOSE 244* 244* 91  BUN 25* 25* 25*  CREATININE 1.30* 1.33* 1.31*  CALCIUM  --  8.5* 8.2*  PROT  --  6.2*  --   ALBUMIN  --  3.0*  --   AST  --  22  --   ALT  --  17  --   ALKPHOS  --  53  --   BILITOT  --  0.8  --   GFRNONAA  --  53* 54*  ANIONGAP  --  8 9     Hematology Recent Labs  Lab 03/25/21 1327 03/25/21 1329 03/26/21 0030  WBC  --  8.2 8.4  RBC  --  3.96* 3.58*  HGB 11.6* 11.8* 10.9*  HCT 34.0* 36.0* 32.8*  MCV  --  90.9 91.6  MCH  --  29.8 30.4  MCHC  --  32.8 33.2  RDW  --  12.2 12.3  PLT  --  295 237    BNPNo results for input(s): BNP, PROBNP in the last 168 hours.   DDimer No results for input(s):  DDIMER in the last 168 hours.   Radiology    CARDIAC CATHETERIZATION  Result Date: 03/25/2021  CULPRIT LESION Ost LAD to Mid LAD lesion is 100% stenosed. (Very late stent thrombosis in the setting of known in-stent restenosis)  Balloon angioplasty was performed using a BALLOON SAPPHIRE 3.0X15 -> followed by a BALLOON SAPPHIRE [place] 3.75X15.  Post intervention, there is a ~0% residual stenosis.-No need for additional stent  -----------------------------  Dist Cx lesion is 60% stenosed with 60% stenosed side branch in 3rd Mrg.  -----------------------------  LV end diastolic pressure is normal.  There is no aortic valve stenosis.  SUMMARY  Severe single-vessel disease with 100% thrombotic occlusion of the ostial to proximal LAD stent treated successfully with balloon angioplasty restoring TIMI-3 flow.  Moderate residual distal LCx disease-treat medically.  Normal LVEDP-LV gram not performed RECOMMENDATIONS  Admit to  CVICU but likely transfer out tomorrow, depending results of echocardiogram, could consider fast-track discharge.  He has apparently been on aspirin and Plavix because of a stroke, would continue  Was also apparently started on  statin, will continue Lipitor 40 mg  Otherwise has been very reluctant to take other medications.  We will hold off for now. Bryan Lemma, MD   Cardiac Studies   Cardiac cath as above - images reviewed  Patient Profile     80 y.o. male with hx of LAD stenting in 2013 presents with anterior STEMI.   Assessment & Plan    Anterior STEMI - very late stent thrombosis of the proximal LAD treated with PTCA. On ASA/plavix/high-intensity statin. Favor change to ticagrelor since his event occurred on ASA and plavix. I discussed with Dr Katrinka Blazing and we are in agreement. Plan: tx tele today, echo today, Phase 1 cardiac rehab, add low dose metoprolol.  Mixed hyperlipidemia: LDL 156--->79 after recently starting statin. Continue atorvastatin, FU lipids 3 months.  Dispo: tx tele today, echo, cardiac rehab. Anticipate home tomorrow if no complications arise.   For questions or updates, please contact [facility] Please consult [email or web address] for contact info under        Signed, Tonny Bollman, MD  03/26/2021, 8:27 AM

## 2021-03-27 ENCOUNTER — Other Ambulatory Visit (HOSPITAL_COMMUNITY): Payer: Self-pay

## 2021-03-27 ENCOUNTER — Telehealth: Payer: Self-pay | Admitting: Interventional Cardiology

## 2021-03-27 MED ORDER — TICAGRELOR 90 MG PO TABS
90.0000 mg | ORAL_TABLET | Freq: Two times a day (BID) | ORAL | 2 refills | Status: DC
  Filled 2021-03-27: qty 60, 30d supply, fill #0

## 2021-03-27 MED ORDER — ATORVASTATIN CALCIUM 40 MG PO TABS
40.0000 mg | ORAL_TABLET | Freq: Every day | ORAL | 1 refills | Status: DC
  Filled 2021-03-27: qty 90, 90d supply, fill #0

## 2021-03-27 MED ORDER — NITROGLYCERIN 0.4 MG SL SUBL
0.4000 mg | SUBLINGUAL_TABLET | SUBLINGUAL | 2 refills | Status: AC | PRN
  Filled 2021-03-27: qty 25, 8d supply, fill #0

## 2021-03-27 NOTE — Telephone Encounter (Signed)
Pt c/o medication issue:  1. Name of Medication:  Brilinta   2. How are you currently taking this medication (dosage and times per day)? 1 tablet twice a day  3. Are you having a reaction (difficulty breathing--STAT)? no  4. What is your medication issue? Patient's daughter states the patient just started the medication this morning. She states he was on something similar for 7 days after his stroke. She states he had a heart attack Wednesday and was put on brilnta, because the hospital felt the other medication was not doing it's job. She states she is questioning the strength of the brilinta, because she does not think the hospital understood he only took the other medication for 7 days She states he is also taking atorvastatin 1 tablet daily and aspirin.

## 2021-03-27 NOTE — Progress Notes (Signed)
Patient educated on discharge instructions and verbalizes understanding. Awaiting pick up by daughter.

## 2021-03-27 NOTE — Plan of Care (Signed)

## 2021-03-27 NOTE — Progress Notes (Signed)
CARDIAC REHAB PHASE I   PRE:  Rate/Rhythm: 52 SB  BP:  Sitting: 154/77      SaO2: 98 RA  MODE:  Ambulation: 925 ft   POST:  Rate/Rhythm: 72 SR  BP:  Sitting: 163/73      SaO2: 97 RA  PT tolerated exercise well and ambulated 925 ft with standby assist. Pt had not rest breaks and no C/O of C/P, SOB, or dizziness. Reviewed education w/ pt and daughter. Pt gave verbal understanding of risk factors, exercise guidelines, NTG use, MI, restrictions, and nutrition. Reinforced Brilinta. Pt in recliner.  [id number]  Joya San, MS, ACM-CEP 03/27/2021 10:11 AM

## 2021-03-27 NOTE — Telephone Encounter (Signed)
Spoke with pt and obtained permission to speak with daughter.  Explained to daughter why Brilinta 90mg  is being used and the importance of taking this medicaiton vs changing him over to Plavix.  Daughter verbalized understanding and was appreciative for call.

## 2021-03-27 NOTE — Discharge Summary (Signed)
Discharge Summary    Patient ID: Philip [facility]ons MRN: [id number]; DOB: 1941-01-06  Admit date: 03/25/2021 Discharge date: 03/27/2021  PCP:  Philip Heys, MD   [facility] Providers Cardiologist:  Philip Noe, MD   {   Discharge Diagnoses    Principal Problem:   Acute ST elevation myocardial infarction (STEMI) due to occlusion of left anterior descending (LAD) coronary artery [facility]) Active Problems:   Coronary stent restenosis due to progression of disease   Hyperlipidemia with target LDL less than 70 - refuses medications   Coronary stent thrombosis   Acute ST elevation myocardial infarction (STEMI) of anterior wall (HCC)    Diagnostic Studies/Procedures    Echo from 03/26/21:   1. Left ventricular ejection fraction, by estimation, is 50 to 55%. The  left ventricle has low normal function. The left ventricle demonstrates  regional wall motion abnormalities (see scoring diagram/findings for  description). There is mild asymmetric  left ventricular hypertrophy of the basal-septal segment. Left ventricular  diastolic parameters are consistent with Grade I diastolic dysfunction  (impaired relaxation).   2. Right ventricular systolic function is normal. The right ventricular  size is mildly enlarged. Tricuspid regurgitation signal is inadequate for  assessing PA pressure.   3. The mitral valve is grossly normal. Trivial mitral valve  regurgitation. No evidence of mitral stenosis.   4. The aortic valve is tricuspid. Aortic valve regurgitation is not  visualized. Mild to moderate aortic valve sclerosis/calcification is  present, without any evidence of aortic stenosis.   5. The inferior vena cava is normal in size with greater than 50%  respiratory variability, suggesting right atrial pressure of 3 mmHg.   Comparison(s): Changes from prior study are noted. EF 50-55%. WMAs  described above.  _____________  Left cardiac cath on 03/25/21:  CULPRIT LESION Ost LAD to Mid  LAD lesion is 100% stenosed. (Very late stent thrombosis in the setting of known in-stent restenosis) Balloon angioplasty was performed using a BALLOON SAPPHIRE 3.0X15 -> followed by a BALLOON SAPPHIRE [place] 3.75X15. Post intervention, there is a ~0% residual stenosis.-No need for additional stent ----------------------------- Dist Cx lesion is 60% stenosed with 60% stenosed side branch in 3rd Mrg. ----------------------------- LV end diastolic pressure is normal. There is no aortic valve stenosis.   SUMMARY Severe single-vessel disease with 100% thrombotic occlusion of the ostial to proximal LAD stent treated successfully with balloon angioplasty restoring TIMI-3 flow. Moderate residual distal LCx disease-treat medically. Normal LVEDP-LV gram not performed     RECOMMENDATIONS Admit to  CVICU but likely transfer out tomorrow, depending results of echocardiogram, could consider fast-track discharge. He has apparently been on aspirin and Plavix because of a stroke, would continue Was also apparently started on statin, will continue Lipitor 40 mg Otherwise has been very reluctant to take other medications.  We will hold off for now.   Diagnostic Dominance: Right    Intervention      History of Present Illness     Philip [facility] is a 80 y.o. male with PMH of CAD s/p LAD stent in 2013, HLD, who is admitted on 03/25/21 for anterior STEMI.   He has been followed by Philip [facility] as an outpatient. He was last seen in the office on 3/22 and reported being in his usual state of health. Noted indicate he expressly wished not to be on statin lowering therapy on multiple occassions. He was most recently admitted on 02/15/21-02/17/21 with acute right thalamic infarct. He underwent neurology work up with  recommendations for ASA/plavix for 3 weeks, then ASA alone. Seen by PT with recommendations for HHPT. Noted indicate a hx of retinal ischemia which was discussed with his primary ophthalmologist, [facility]  who was ok with ASA/plavix. Echo showed EF of 65-70% with G1DD.   On 6/29 he developed acute onset of chest pain around 12pm while he was eating lunch. EMS called. On arrival EKG showed SR with acute ST elevation in anteroseptal leads. CODE STEMI called in the field. Given 324 ASA and 3 SL nitro en route.  On arrival he was taken directly to the cath lab for emergent cardiac cath. Still with active chest pain on arrival to the cath lab.   [facility] Course     Consultants: N/A   Anterior STEMI CAD with hx of LAD stent in 2013 - presented with chest pain while eating lunch; EKG in the field showed anteroseptal ST elevation. STEMI called, given 324 ASA and 3 SL nitro. Brought directly to the cath lab for emergent cardiac cath. HS Trop 131 >1329 >4938 - cath on 03/25/21 showed severe single-vessel disease with 100% thrombotic occlusion of the ostial to proximal LAD stent, treated successfully with balloon angioplasty restoring TIMI-3 flow. Moderate residual distal LCx disease-treat medically. Normal LVEDP - Echo  with LVEF is in the 50 to 55% with hypokinesis of the anterior wall and anteroapex. - LDL 79, started on lipitor 40mg  daily, repeat in 3 month  - A1C 6.1%, recommend [facility] diet, repeat in 3 month  - Bradycardia significant after receiving metoprolol 12.5mg , this is discontinued  - continue Philip therapy with DAPT including ASA 81mg , ticagrelor 90mg  BID, atorvastatin 40mg , and PRN Nitro at discharge, new script sent to [facility] pharmacy  - first available follow up appointment with cardiology made on 04/10/21 10AM at [facility] location, patient informed and agreeable    HLD -  historically has not wanted to be on medications per notes from Philip [facility] -  LDL 79 on 03/25/21, started on statin this admission, continue atorvastatin 40mg  daily  - repeat LFT in 1 month and lipid panel in 3 month   CKD stage III - renal index near baseline    Recent CVA  neurology work up 5/22-5/24 with recommendations  for ASA/Plavix and statin   Pre-diabetes - A1C 6.1%, recommend [facility] diet, repeat in 3 month      Did the patient have an acute coronary syndrome (MI, NSTEMI, STEMI, etc) this admission?:  Yes                               AHA/ACC Clinical Performance & Quality Measures: Aspirin prescribed? - Yes ADP Receptor Inhibitor (Plavix/Clopidogrel, Brilinta/Ticagrelor or Effient/Prasugrel) prescribed (includes medically managed patients)? - Yes Beta Blocker prescribed? - No - bradycardia  High Intensity Statin (Lipitor 40-80mg  or Crestor 20-40mg ) prescribed? - Yes EF assessed during THIS hospitalization? - Yes For EF <40%, was ACEI/ARB prescribed? - Not Applicable (EF >/= 40%) For EF <40%, Aldosterone Antagonist (Spironolactone or Eplerenone) prescribed? - Not Applicable (EF >/= 40%) Cardiac Rehab Phase II ordered (including medically managed patients)? - Yes      _____________  Discharge Vitals Blood pressure (!) 142/75, pulse 74, temperature (!) 97.4 F (36.3 C), resp. rate 16, height 5\' 9"  (1.753 m), weight 74.8 kg, SpO2 98 %.  Filed Weights   03/25/21 1300  Weight: 74.8 kg    Labs & Radiologic Studies    CBC Recent Labs  03/25/21 1329 03/26/21 0030  WBC 8.2 8.4  NEUTROABS 4.8  --   HGB 11.8* 10.9*  HCT 36.0* 32.8*  MCV 90.9 91.6  PLT 295 237   Basic Metabolic Panel Recent Labs    62/13/08 1329 03/26/21 0030  NA 136 141  K 3.4* 3.5  CL 105 108  CO2 23 24  GLUCOSE 244* 91  BUN 25* 25*  CREATININE 1.33* 1.31*  CALCIUM 8.5* 8.2*   Liver Function Tests Recent Labs    03/25/21 1329  AST 22  ALT 17  ALKPHOS 53  BILITOT 0.8  PROT 6.2*  ALBUMIN 3.0*   No results for input(s): LIPASE, AMYLASE in the last 72 hours. High Sensitivity Troponin:   Recent Labs  Lab 03/25/21 1329 03/25/21 1506 03/25/21 1756  TROPONINIHS 131* 1,329* 4,938*    BNP Invalid input(s): POCBNP D-Dimer No results for input(s): DDIMER in the last 72 hours. Hemoglobin A1C Recent  Labs    03/25/21 1329  HGBA1C 6.1*   Fasting Lipid Panel Recent Labs    03/25/21 1506  CHOL 137  HDL 52  LDLCALC 79  TRIG 31  CHOLHDL 2.6   Thyroid Function Tests No results for input(s): TSH, T4TOTAL, T3FREE, THYROIDAB in the last 72 hours.  Invalid input(s): FREET3 _____________  CARDIAC CATHETERIZATION  Result Date: 03/25/2021  CULPRIT LESION Ost LAD to Mid LAD lesion is 100% stenosed. (Very late stent thrombosis in the setting of known in-stent restenosis)  Balloon angioplasty was performed using a BALLOON SAPPHIRE 3.0X15 -> followed by a BALLOON SAPPHIRE [place] 3.75X15.  Post intervention, there is a ~0% residual stenosis.-No need for additional stent  -----------------------------  Dist Cx lesion is 60% stenosed with 60% stenosed side branch in 3rd Mrg.  -----------------------------  LV end diastolic pressure is normal.  There is no aortic valve stenosis.  SUMMARY  Severe single-vessel disease with 100% thrombotic occlusion of the ostial to proximal LAD stent treated successfully with balloon angioplasty restoring TIMI-3 flow.  Moderate residual distal LCx disease-treat medically.  Normal LVEDP-LV gram not performed RECOMMENDATIONS  Admit to  CVICU but likely transfer out tomorrow, depending results of echocardiogram, could consider fast-track discharge.  He has apparently been on aspirin and Plavix because of a stroke, would continue  Was also apparently started on statin, will continue Lipitor 40 mg  Otherwise has been very reluctant to take other medications.  We will hold off for now. Bryan Lemma, MD  ECHOCARDIOGRAM COMPLETE  Result Date: 03/26/2021    ECHOCARDIOGRAM REPORT   Patient Name:   Philip [facility]ons Date of Exam: 03/26/2021 Philip Rec #:  [id number]    Height:       69.0 in Accession #:    [id number]   Weight:       165.0 lb Date of Birth:  1940/12/10    BSA:          1.904 m Patient Age:    80 years     BP:           108/43 mmHg Patient Gender: M            HR:            55 bpm. Exam Location:  Inpatient Procedure: 2D Echo, Cardiac Doppler and Color Doppler Indications:    CAD  History:        Patient has prior history of Echocardiogram examinations, most                 recent  02/16/2021. CAD and Previous Myocardial Infarction,                 Stroke; Risk Factors:Dyslipidemia.  Sonographer:    Shirlean Kelly Referring Phys: [phone] DAVID W HARDING IMPRESSIONS  1. Left ventricular ejection fraction, by estimation, is 50 to 55%. The left ventricle has low normal function. The left ventricle demonstrates regional wall motion abnormalities (see scoring diagram/findings for description). There is mild asymmetric left ventricular hypertrophy of the basal-septal segment. Left ventricular diastolic parameters are consistent with Grade I diastolic dysfunction (impaired relaxation).  2. Right ventricular systolic function is normal. The right ventricular size is mildly enlarged. Tricuspid regurgitation signal is inadequate for assessing PA pressure.  3. The mitral valve is grossly normal. Trivial mitral valve regurgitation. No evidence of mitral stenosis.  4. The aortic valve is tricuspid. Aortic valve regurgitation is not visualized. Mild to moderate aortic valve sclerosis/calcification is present, without any evidence of aortic stenosis.  5. The inferior vena cava is normal in size with greater than 50% respiratory variability, suggesting right atrial pressure of 3 mmHg. Comparison(s): Changes from prior study are noted. EF 50-55%. WMAs described above. FINDINGS  Left Ventricle: Left ventricular ejection fraction, by estimation, is 50 to 55%. The left ventricle has low normal function. The left ventricle demonstrates regional wall motion abnormalities. The left ventricular internal cavity size was normal in size. There is mild asymmetric left ventricular hypertrophy of the basal-septal segment. Left ventricular diastolic parameters are consistent with Grade I diastolic dysfunction  (impaired relaxation).  LV Wall Scoring: The mid and distal anterior wall and apex are hypokinetic. Right Ventricle: The right ventricular size is mildly enlarged. No increase in right ventricular wall thickness. Right ventricular systolic function is normal. Tricuspid regurgitation signal is inadequate for assessing PA pressure. Left Atrium: Left atrial size was normal in size. Right Atrium: Right atrial size was normal in size. Pericardium: Trivial pericardial effusion is present. Mitral Valve: The mitral valve is grossly normal. Trivial mitral valve regurgitation. No evidence of mitral valve stenosis. Tricuspid Valve: The tricuspid valve is grossly normal. Tricuspid valve regurgitation is mild . No evidence of tricuspid stenosis. Aortic Valve: The aortic valve is tricuspid. Aortic valve regurgitation is not visualized. Mild to moderate aortic valve sclerosis/calcification is present, without any evidence of aortic stenosis. Aortic valve mean gradient measures 5.0 mmHg. Aortic valve peak gradient measures 9.0 mmHg. Aortic valve area, by VTI measures 2.16 cm. Pulmonic Valve: The pulmonic valve was grossly normal. Pulmonic valve regurgitation is not visualized. No evidence of pulmonic stenosis. Aorta: The aortic root and ascending aorta are structurally normal, with no evidence of dilitation. Venous: The inferior vena cava is normal in size with greater than 50% respiratory variability, suggesting right atrial pressure of 3 mmHg. IAS/Shunts: The atrial septum is grossly normal.  LEFT VENTRICLE PLAX 2D LVIDd:         4.10 cm  Diastology LVIDs:         2.60 cm  LV e' medial:    7.18 cm/s LV PW:         1.00 cm  LV E/e' medial:  12.9 LV IVS:        1.20 cm  LV e' lateral:   8.70 cm/s LVOT diam:     1.90 cm  LV E/e' lateral: 10.7 LV SV:         73 LV SV Index:   38 LVOT Area:     2.84 cm  RIGHT VENTRICLE  IVC RV Basal diam:  4.30 cm     IVC diam: 1.00 cm RV S prime:     11.40 cm/s TAPSE (M-mode): 2.6 cm  LEFT ATRIUM             Index       RIGHT ATRIUM           Index LA diam:        3.30 cm 1.73 cm/m  RA Area:     16.50 cm LA Vol (A2C):   65.8 ml 34.56 ml/m RA Volume:   45.70 ml  24.00 ml/m LA Vol (A4C):   62.0 ml 32.57 ml/m LA Biplane Vol: 66.5 ml 34.93 ml/m  AORTIC VALVE AV Area (Vmax):    2.42 cm AV Area (Vmean):   2.10 cm AV Area (VTI):     2.16 cm AV Vmax:           150.00 cm/s AV Vmean:          103.000 cm/s AV VTI:            0.337 m AV Peak Grad:      9.0 mmHg AV Mean Grad:      5.0 mmHg LVOT Vmax:         128.00 cm/s LVOT Vmean:        76.400 cm/s LVOT VTI:          0.257 m LVOT/AV VTI ratio: 0.76  AORTA Ao Root diam: 3.10 cm Ao Asc diam:  2.30 cm MITRAL VALVE MV Area (PHT): 3.85 cm     SHUNTS MV Decel Time: 197 msec     Systemic VTI:  0.26 m MV E velocity: 92.70 cm/s   Systemic Diam: 1.90 cm MV A velocity: 106.00 cm/s MV E/A ratio:  0.87 Lennie Odor MD Electronically signed by Lennie Odor MD Signature Date/Time: 03/26/2021/11:14:41 AM    Final    Disposition   Patient is seen by Dr Excell Seltzer today, deemed stable for discharge. He is agreeable with medication changes and follow up on 04/10/21. Pt is being discharged home today in good condition.  Follow-up Plans & Appointments     Follow-up Information     [facility] Follow up on 04/10/2021.   Specialty: Cardiology Why: please arrive at 10 AM for your post [facility] cardiology follow up appointment with NP Ms. Walker Contact information: [address] [place] [address] [phone]               Discharge Instructions     Amb Referral to Cardiac Rehabilitation   Complete by: As directed    Diagnosis:  STEMI PTCA     After initial evaluation and assessments completed: Virtual Based Care may be provided alone or in conjunction with Phase 2 Cardiac Rehab based on patient barriers.: Yes   Diet - low sodium heart healthy   Complete by: As directed     Discharge instructions   Complete by: As directed    PLEASE REMEMBER TO BRING ALL OF YOUR MEDICATIONS TO EACH OF YOUR FOLLOW-UP OFFICE VISITS.  PLEASE ATTEND ALL SCHEDULED FOLLOW-UP APPOINTMENTS.   Activity: Increase activity slowly as tolerated. You may shower, but no soaking baths (or swimming) for 1 week. No driving for 24 hours. No lifting over 5 lbs for 1 week. No sexual activity for 1 week.   You May Return to Work: in 2 week (if applicable)  Wound Care: You may wash cath site gently with soap and  water. Keep cath site clean and dry. If you notice pain, swelling, bleeding or pus at your cath site, please call [phone].   PLEASE DO NOT MISS ANY DOSES OF YOUR ASPIRIN/BRILINTA/!!!!!   Also keep a log of you blood pressures and bring back to your follow up appt. Please call the office with any questions.   Patients taking blood thinners should generally stay away from medicines like ibuprofen, Advil, Motrin, naproxen, and Aleve due to risk of stomach bleeding. You may take Tylenol as directed or talk to your primary doctor about alternatives.  PLEASE ENSURE THAT YOU DO NOT RUN OUT OF YOUR ASPIRIN/BRILINTA. This medication is very important to remain on for at least 12 months. IF you have issues obtaining this medication due to cost please CALL the office 3-5 business days prior to running out in order to prevent missing doses of this medication.    Radial Site Care  Refer to this sheet in the next few weeks. These instructions provide you with information on caring for yourself after your procedure. Your caregiver may also give you more specific instructions. Your treatment has been planned according to current [facility] practices, but problems sometimes occur. Call your caregiver if you have any problems or questions after your procedure.  HOME CARE INSTRUCTIONS You may shower the day after the procedure. Remove the bandage (dressing) and gently wash the site with plain soap and  water. Gently pat the site dry.  Do not apply powder or lotion to the site.  Do not submerge the affected site in water for 3 to 5 days.  Inspect the site at least twice daily.  Do not flex or bend the affected arm for 24 hours.  No lifting over 5 pounds (2.3 kg) for 5 days after your procedure.  Do not drive home if you are discharged the same day of the procedure. Have someone else drive you.  You may drive 24 hours after the procedure unless otherwise instructed by your caregiver.   What to expect: Any bruising will usually fade within 1 to 2 weeks.  Blood that collects in the tissue (hematoma) may be painful to the touch. It should usually decrease in size and tenderness within 1 to 2 weeks.   SEEK IMMEDIATE [facility] CARE IF: You have unusual pain at the radial site.  You have redness, warmth, swelling, or pain at the radial site.  You have drainage (other than a small amount of blood on the dressing).  You have chills.  You have a fever or persistent symptoms for more than 72 hours.  You have a fever and your symptoms suddenly get worse.  Your arm becomes pale, cool, tingly, or numb.  You have heavy bleeding from the site. Hold pressure on the site.   Increase activity slowly   Complete by: As directed        Discharge Medications   Allergies as of 03/27/2021       Reactions   Meloxicam Other (See Comments)   Feels bad ([facility] by [facility])        Medication List     TAKE these medications    aspirin EC 81 MG tablet Take 1 tablet (81 mg total) by mouth daily. Swallow whole.   atorvastatin 40 MG tablet Commonly known as: LIPITOR Take 1 tablet (40 mg total) by mouth daily.   CoQ10 100 MG Caps Take 100 mg by mouth daily.   latanoprost 0.005 % ophthalmic solution Commonly known as: Harrel LemonXALATAN  Place 1 drop into the left eye at bedtime.   MAGNESIUM PO Take 250 mg by mouth daily.   nitroGLYCERIN 0.4 MG SL tablet Commonly known as: NITROSTAT Place 1  tablet (0.4 mg total) under the tongue every 5 (five) minutes as needed for chest pain. What changed: See the new instructions.   ticagrelor 90 MG Tabs tablet Commonly known as: BRILINTA Take 1 tablet (90 mg total) by mouth 2 (two) times daily.   VITAMIN C PO Take 500 mg by mouth daily.   vitamin D3 50 MCG (2000 UT) Caps Take 2,000 Units by mouth daily.   vitamin E 180 MG (400 UNITS) capsule Take 400 Units by mouth daily.           Outstanding Labs/Studies   LFT, lipid, and A1C repeat in 1-3 month    Duration of Discharge Encounter   Greater than 30 minutes including physician time.  Signed, Cyndi Bender, NP 03/27/2021, 9:28 AM

## 2021-03-27 NOTE — Progress Notes (Signed)
Progress Note  Patient Name: Philip Parsons Date of Encounter: 03/27/2021  [facility] Cardiologist: Lesleigh Noe, MD   Subjective   Feels well.  No chest pain or shortness of breath.  No complaints this morning.  Bradycardia after receiving metoprolol yesterday noted.  Inpatient Medications    Scheduled Meds:  aspirin EC  81 mg Oral Daily   atorvastatin  40 mg Oral Daily   Chlorhexidine Gluconate Cloth  6 each Topical Daily   latanoprost  1 drop Left Eye QHS   magnesium oxide  200 mg Oral Daily   metoprolol tartrate  12.5 mg Oral BID   sodium chloride flush  3 mL Intravenous Q12H   ticagrelor  90 mg Oral BID   Continuous Infusions:  sodium chloride Stopped (03/26/21 0328)   PRN Meds: sodium chloride, acetaminophen, nitroGLYCERIN, ondansetron (ZOFRAN) IV, sodium chloride flush   Vital Signs    Vitals:   03/27/21 0000 03/27/21 0341 03/27/21 0400 03/27/21 0700  BP: 137/64 126/67    Pulse:      Resp: 20 12 15    Temp:  97.8 F (36.6 C)  (!) 97.4 F (36.3 C)  TempSrc:  Oral    SpO2:  98%    Weight:      Height:        Intake/Output Summary (Last 24 hours) at 03/27/2021 0821 Last data filed at 03/27/2021 0000 Gross per 24 hour  Intake 720 ml  Output --  Net 720 ml   Last 3 Weights 03/25/2021 02/15/2021 12/11/2020  Weight (lbs) 165 lb 165 lb 161 lb  Weight (kg) 74.844 kg 74.844 kg 73.029 kg      Telemetry    NSR with few PVC's, no sustained arrhythmia or consecutive PVC's, periods of bradycardia - Personally Reviewed   Physical Exam  Alert, oriented, in NAD GEN: No acute distress.   Neck: No JVD Cardiac: RRR, no murmurs, rubs, or gallops.  Respiratory: Clear to auscultation bilaterally. GI: Soft, nontender, non-distended  MS: No edema; No deformity. Neuro:  Nonfocal  Psych: Normal affect  Skin: warm/dry/no rash  Labs    High Sensitivity Troponin:   Recent Labs  Lab 03/25/21 1329 03/25/21 1506 03/25/21 1756  TROPONINIHS 131* 1,329* 4,938*       Chemistry Recent Labs  Lab 03/25/21 1327 03/25/21 1329 03/26/21 0030  NA 141 136 141  K 3.6 3.4* 3.5  CL 105 105 108  CO2  --  23 24  GLUCOSE 244* 244* 91  BUN 25* 25* 25*  CREATININE 1.30* 1.33* 1.31*  CALCIUM  --  8.5* 8.2*  PROT  --  6.2*  --   ALBUMIN  --  3.0*  --   AST  --  22  --   ALT  --  17  --   ALKPHOS  --  53  --   BILITOT  --  0.8  --   GFRNONAA  --  53* 54*  ANIONGAP  --  8 9     Hematology Recent Labs  Lab 03/25/21 1327 03/25/21 1329 03/26/21 0030  WBC  --  8.2 8.4  RBC  --  3.96* 3.58*  HGB 11.6* 11.8* 10.9*  HCT 34.0* 36.0* 32.8*  MCV  --  90.9 91.6  MCH  --  29.8 30.4  MCHC  --  32.8 33.2  RDW  --  12.2 12.3  PLT  --  295 237    BNPNo results for input(s): BNP, PROBNP in the last  168 hours.   DDimer No results for input(s): DDIMER in the last 168 hours.   Radiology    CARDIAC CATHETERIZATION  Result Date: 03/25/2021  CULPRIT LESION Ost LAD to Mid LAD lesion is 100% stenosed. (Very late stent thrombosis in the setting of known in-stent restenosis)  Balloon angioplasty was performed using a BALLOON SAPPHIRE 3.0X15 -> followed by a BALLOON SAPPHIRE [place] 3.75X15.  Post intervention, there is a ~0% residual stenosis.-No need for additional stent  -----------------------------  Dist Cx lesion is 60% stenosed with 60% stenosed side branch in 3rd Mrg.  -----------------------------  LV end diastolic pressure is normal.  There is no aortic valve stenosis.  SUMMARY  Severe single-vessel disease with 100% thrombotic occlusion of the ostial to proximal LAD stent treated successfully with balloon angioplasty restoring TIMI-3 flow.  Moderate residual distal LCx disease-treat medically.  Normal LVEDP-LV gram not performed RECOMMENDATIONS  Admit to  CVICU but likely transfer out tomorrow, depending results of echocardiogram, could consider fast-track discharge.  He has apparently been on aspirin and Plavix because of a stroke, would continue  Was  also apparently started on statin, will continue Lipitor 40 mg  Otherwise has been very reluctant to take other medications.  We will hold off for now. Bryan Lemma, MD  ECHOCARDIOGRAM COMPLETE  Result Date: 03/26/2021    ECHOCARDIOGRAM REPORT   Patient Name:   Philip Parsons Date of Exam: 03/26/2021 Medical Rec #:  [id number]    Height:       69.0 in Accession #:    [id number]   Weight:       165.0 lb Date of Birth:  1941-02-05    BSA:          1.904 m Patient Age:    80 years     BP:           108/43 mmHg Patient Gender: M            HR:           55 bpm. Exam Location:  Inpatient Procedure: 2D Echo, Cardiac Doppler and Color Doppler Indications:    CAD  History:        Patient has prior history of Echocardiogram examinations, most                 recent 02/16/2021. CAD and Previous Myocardial Infarction,                 Stroke; Risk Factors:Dyslipidemia.  Sonographer:    Shirlean Kelly Referring Phys: [phone] DAVID W HARDING IMPRESSIONS  1. Left ventricular ejection fraction, by estimation, is 50 to 55%. The left ventricle has low normal function. The left ventricle demonstrates regional wall motion abnormalities (see scoring diagram/findings for description). There is mild asymmetric left ventricular hypertrophy of the basal-septal segment. Left ventricular diastolic parameters are consistent with Grade I diastolic dysfunction (impaired relaxation).  2. Right ventricular systolic function is normal. The right ventricular size is mildly enlarged. Tricuspid regurgitation signal is inadequate for assessing PA pressure.  3. The mitral valve is grossly normal. Trivial mitral valve regurgitation. No evidence of mitral stenosis.  4. The aortic valve is tricuspid. Aortic valve regurgitation is not visualized. Mild to moderate aortic valve sclerosis/calcification is present, without any evidence of aortic stenosis.  5. The inferior vena cava is normal in size with greater than 50% respiratory variability, suggesting  right atrial pressure of 3 mmHg. Comparison(s): Changes from prior study are noted. EF 50-55%. WMAs described above.  FINDINGS  Left Ventricle: Left ventricular ejection fraction, by estimation, is 50 to 55%. The left ventricle has low normal function. The left ventricle demonstrates regional wall motion abnormalities. The left ventricular internal cavity size was normal in size. There is mild asymmetric left ventricular hypertrophy of the basal-septal segment. Left ventricular diastolic parameters are consistent with Grade I diastolic dysfunction (impaired relaxation).  LV Wall Scoring: The mid and distal anterior wall and apex are hypokinetic. Right Ventricle: The right ventricular size is mildly enlarged. No increase in right ventricular wall thickness. Right ventricular systolic function is normal. Tricuspid regurgitation signal is inadequate for assessing PA pressure. Left Atrium: Left atrial size was normal in size. Right Atrium: Right atrial size was normal in size. Pericardium: Trivial pericardial effusion is present. Mitral Valve: The mitral valve is grossly normal. Trivial mitral valve regurgitation. No evidence of mitral valve stenosis. Tricuspid Valve: The tricuspid valve is grossly normal. Tricuspid valve regurgitation is mild . No evidence of tricuspid stenosis. Aortic Valve: The aortic valve is tricuspid. Aortic valve regurgitation is not visualized. Mild to moderate aortic valve sclerosis/calcification is present, without any evidence of aortic stenosis. Aortic valve mean gradient measures 5.0 mmHg. Aortic valve peak gradient measures 9.0 mmHg. Aortic valve area, by VTI measures 2.16 cm. Pulmonic Valve: The pulmonic valve was grossly normal. Pulmonic valve regurgitation is not visualized. No evidence of pulmonic stenosis. Aorta: The aortic root and ascending aorta are structurally normal, with no evidence of dilitation. Venous: The inferior vena cava is normal in size with greater than 50%  respiratory variability, suggesting right atrial pressure of 3 mmHg. IAS/Shunts: The atrial septum is grossly normal.  LEFT VENTRICLE PLAX 2D LVIDd:         4.10 cm  Diastology LVIDs:         2.60 cm  LV e' medial:    7.18 cm/s LV PW:         1.00 cm  LV E/e' medial:  12.9 LV IVS:        1.20 cm  LV e' lateral:   8.70 cm/s LVOT diam:     1.90 cm  LV E/e' lateral: 10.7 LV SV:         73 LV SV Index:   38 LVOT Area:     2.84 cm  RIGHT VENTRICLE             IVC RV Basal diam:  4.30 cm     IVC diam: 1.00 cm RV S prime:     11.40 cm/s TAPSE (M-mode): 2.6 cm LEFT ATRIUM             Index       RIGHT ATRIUM           Index LA diam:        3.30 cm 1.73 cm/m  RA Area:     16.50 cm LA Vol (A2C):   65.8 ml 34.56 ml/m RA Volume:   45.70 ml  24.00 ml/m LA Vol (A4C):   62.0 ml 32.57 ml/m LA Biplane Vol: 66.5 ml 34.93 ml/m  AORTIC VALVE AV Area (Vmax):    2.42 cm AV Area (Vmean):   2.10 cm AV Area (VTI):     2.16 cm AV Vmax:           150.00 cm/s AV Vmean:          103.000 cm/s AV VTI:            0.337 m AV Peak Grad:  9.0 mmHg AV Mean Grad:      5.0 mmHg LVOT Vmax:         128.00 cm/s LVOT Vmean:        76.400 cm/s LVOT VTI:          0.257 m LVOT/AV VTI ratio: 0.76  AORTA Ao Root diam: 3.10 cm Ao Asc diam:  2.30 cm MITRAL VALVE MV Area (PHT): 3.85 cm     SHUNTS MV Decel Time: 197 msec     Systemic VTI:  0.26 m MV E velocity: 92.70 cm/s   Systemic Diam: 1.90 cm MV A velocity: 106.00 cm/s MV E/A ratio:  0.87 Lennie Odor MD Electronically signed by Lennie Odor MD Signature Date/Time: 03/26/2021/11:14:41 AM    Final     Cardiac Studies   Echo reviewed as above  Patient Profile     80 y.o. male with hx of LAD stenting in 2013 presents with anterior STEMI.   Assessment & Plan    Anterior STEMI: Patient doing very well.  Heart rhythm stable.  No recurrent chest pain.  Ambulated with cardiac rehab yesterday with no symptoms or problems.  Plans as outlined in yesterday's note with long-term antiplatelet  therapy using aspirin and ticagrelor after experiencing very late stent thrombosis.  The patient's 2D echocardiogram is reviewed and fortunately his LVEF is in the 50 to 55% range with hypokinesis of the anterior wall and anteroapex.  Suspect he has a good chance for complete recovery of LV function.  Patient is medically stable for discharge today.  We will arrange follow-up with Dr. Katrinka Blazing or his APP.  The patient experienced marked bradycardia yesterday after receiving metoprolol.  We will discontinue this as he only received the 12.5 mg dose and I do not think he will tolerate it well. Mixed hyperlipidemia: Continue high intensity statin therapy.  Follow-up lipids and LFTs in about 3 months.  Disposition: Stable for discharge today.  For questions or updates, please contact [facility] Please consult [email or web address] for contact info under        Signed, Tonny Bollman, MD  03/27/2021, 8:21 AM

## 2021-04-01 ENCOUNTER — Encounter: Payer: Self-pay | Admitting: Adult Health

## 2021-04-01 ENCOUNTER — Other Ambulatory Visit: Payer: Self-pay

## 2021-04-01 ENCOUNTER — Ambulatory Visit (INDEPENDENT_AMBULATORY_CARE_PROVIDER_SITE_OTHER): Payer: Medicare Other | Admitting: Adult Health

## 2021-04-01 VITALS — BP 126/64 | HR 72 | Ht 69.0 in | Wt 154.4 lb

## 2021-04-01 DIAGNOSIS — E785 Hyperlipidemia, unspecified: Secondary | ICD-10-CM | POA: Diagnosis not present

## 2021-04-01 DIAGNOSIS — I10 Essential (primary) hypertension: Secondary | ICD-10-CM | POA: Diagnosis not present

## 2021-04-01 DIAGNOSIS — I6381 Other cerebral infarction due to occlusion or stenosis of small artery: Secondary | ICD-10-CM

## 2021-04-01 DIAGNOSIS — G3184 Mild cognitive impairment, so stated: Secondary | ICD-10-CM

## 2021-04-01 DIAGNOSIS — I639 Cerebral infarction, unspecified: Secondary | ICD-10-CM | POA: Diagnosis not present

## 2021-04-01 NOTE — Patient Instructions (Signed)
Continue aspirin 81 mg daily  and atorvastatin 40 mg daily for secondary stroke prevention  you will continue Brilinta as advised by cardiology -ensure follow-up as scheduled  Continue to follow up with PCP regarding cholesterol, blood pressure and diabetes management  Maintain strict control of hypertension with blood pressure goal below 130/90, diabetes with hemoglobin A1c goal below 7% and cholesterol with LDL cholesterol (bad cholesterol) goal below 70 mg/dL.   In regards to your memory loss concerns, I recommend that you follow back up with your PCP to discuss specific lab work that can be completed to look for reversible causes and consider referral to neuropsychology for neurocognitive testing.  It is also very important to ensure that you manage stroke risk factors including blood pressure and cholesterol as well as maintaining a healthy diet, adequate exercise and good sleep hygiene.  Also continuing to do memory exercises will be very beneficial.      Followup in the future with me in 6 months or call earlier if needed       Thank you for coming to see [place] at [facility]. I hope we have been able to provide you high quality care today.  You may receive a patient satisfaction survey over the next few weeks. We would appreciate your feedback and comments so that we may continue to improve ourselves and the health of our patients.    Mild Neurocognitive Disorder Mild neurocognitive disorder, formerly known as mild cognitive impairment, is a disorder in which memory does not work as well as it should. This disorder may also cause problems with other mental functions, including thought, communication, behavior, and completion of tasks. These problems can be noticed and measured, but they usually do not interfere with daily activities or theability to live independently. Mild neurocognitive disorder typically develops after 82 years of age, but it can also develop at  younger ages. It is not as serious as major neurocognitive disorder, also known as dementia, but it may be the first sign of it. Generally, symptoms of this condition get worse over time. In rare cases,symptoms can get better. What are the causes? This condition may be caused by: Brain disorders like Alzheimer's disease, Parkinson's disease, and other conditions that gradually damage nerve cells (neurodegenerative conditions). Diseases that affect blood vessels in the brain and result in small strokes. Certain infections, such as HIV. Traumatic brain injury. Other medical conditions, such as brain tumors, underactive thyroid (hypothyroidism), and vitamin B12 deficiency. Use of certain drugs or prescription medicines. What increases the risk? The following factors may make you more likely to develop this condition: Being older than 65 years. Being male. Low education level. Diabetes, high blood pressure, high cholesterol, and other conditions that increase the risk for blood vessel diseases. Untreated or undertreated sleep apnea. Having a certain type of gene that can be passed from parent to child (inherited). Chronic health problems such as heart disease, lung disease, liver disease, kidney disease, or depression. What are the signs or symptoms? Symptoms of this condition include: Difficulty remembering. You may: Forget names, phone numbers, or details of recent events. Forget social events and appointments. Repeatedly forget where you put your car keys or other items. Difficulty thinking and solving problems. You may have trouble with complex tasks, such as: Paying bills. Driving in unfamiliar places. Difficulty communicating. You may have trouble: Finding the right word or naming an object. Forming a sentence that makes sense, or understanding what you read or hear. Changes in your  behavior or personality. When this happens, you may: Lose interest in the things that you used to  enjoy. Withdraw from social situations. Get angry more easily than usual. Act before thinking. How is this diagnosed? This condition is diagnosed based on: Your symptoms. Your health care provider may ask you and the people you spend time with, such as family and friends, about your symptoms. Evaluation of mental functions (neuropsychological testing). Your health care provider may refer you to a neurologist or mental health specialist to evaluate your mental functions in detail. To identify the cause of your condition, your health care provider may: Get a detailed medical history. Ask about use of alcohol, drugs, and prescription medicines. Do a physical exam. Order blood tests and brain imaging exams. How is this treated? Mild neurocognitive disorder that is caused by medicine use, drug use, infection, or another medical condition may improve when the cause is treated, or when medicines or drugs are stopped. If this disorder has another cause, it generally does not improve and may get worse. In these cases, the goal of treatment is to help you manage the loss of mental function. Treatments in these cases include: Medicine. Medicine mainly helps memory and behavior symptoms. Talk therapy. Talk therapy provides education, emotional support, memory aids, and other ways of making up for problems with mental function. Lifestyle changes, including: Getting regular exercise. Eating a healthy diet that includes omega-3 fatty acids. Challenging your thinking and memory skills. Having more social interaction. Follow these instructions at home: Eating and drinking  Drink enough fluid to keep your urine pale yellow. Eat a healthy diet that includes omega-3 fatty acids. These can be found in: Fish. Nuts. Leafy vegetables. Vegetable oils. If you drink alcohol: Limit how much you use to: 0-1 drink a day for women. 0-2 drinks a day for men. Be aware of how much alcohol is in your drink. In the  [place] one drink equals one 12 oz bottle of beer (355 mL), one 5 oz glass of wine (148 mL), or one 1 oz glass of hard liquor (44 mL).  Lifestyle  Get regular exercise as told by your health care provider. Do not use any products that contain nicotine or tobacco, such as cigarettes, e-cigarettes, and chewing tobacco. If you need help quitting, ask your health care provider. Practice ways to manage stress. If you need help managing stress, ask your health care provider. Continue to have social interaction. Keep your mind active with stimulating activities you enjoy, such as reading or playing games. Make sure to get quality sleep. Follow these tips: Avoid napping during the day. Keep your sleeping area dark and cool. Avoid exercising during the few hours before you go to bed. Avoid caffeine products in the evening.  General instructions Take over-the-counter and prescription medicines only as told by your health care provider. Your health care provider may recommend that you avoid taking medicines that can affect thinking, such as pain medicines or sleep medicines. Work with your health care provider to find out what you need help with and what your safety needs are. Keep all follow-up visits. This is important. Where to find more information [employer] on Aging: https://walker.com/ Contact a health care provider if: You have any new symptoms. Get help right away if: You develop new confusion or your confusion gets worse. You act in ways that place you or your family in danger. Summary Mild neurocognitive disorder is a disorder in which memory does not work as well as  it should. Mild neurocognitive disorder can have many causes. It may be the first stage of dementia. To manage your condition, get regular exercise, keep your mind active, get quality sleep, and eat a healthy diet. This information is not intended to replace advice given to you by your health care provider. Make sure you  discuss any questions you have with your healthcare provider. Document Revised: 01/28/2020 Document Reviewed: 01/28/2020 Elsevier Patient Education  2022 [employer].

## 2021-04-01 NOTE — Progress Notes (Addendum)
[facility] [address] [place]. [place] [address] [phone]       HOSPITAL FOLLOW UP NOTE  Mr. Philip Parsons Date of Birth:  January 04, 1941 Medical Record Number:  [id number]   Reason for Referral:  hospital stroke follow up    SUBJECTIVE:   CHIEF COMPLAINT:  Chief Complaint  Patient presents with   Hospital FU    Rm 3, dgtr- Terri  "doing well, completed PT"    HPI:   Philip Parsons is a 80 y.o. male with a past medical history significant for CAD (s/p LAD drug-eluting stent 2013), hyperlipidemia, remote smoking (2.5 pack years while in middle school/high school), borderline hypertension, and CRVO, who presented to [facility] ED on 02/15/2021 with sudden onset left leg weakness.  Personally reviewed hospitalization pertinent progress notes, lab work and imaging with summary provided.  Evaluated by Dr. Pearlean Brownie with stroke work-up revealing right thalamic lacunar stroke s/p tPA secondary to small vessel disease.  LDL 156 -initiate atorvastatin 40 mg daily.  HTN stable.  Other stroke risk factors include advanced age, history of stroke/TIA and CAD.  Right thalamic lacunar stroke from small vessel disease Code Stroke, CT head No acute abnormality. Small vessel disease. Atrophy.   MRI: Small area of acute ischemia within the right thalamus MRA: Suspected severe left distal P2 PCA stenosis.  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  He reports he has recovered well without residual stroke deficits and denies new stroke/TIA symptoms.  He has returned back to all prior activities without difficulty.  He continues to reside with  his wife and is able to maintain ADLs and IADLs independently.  Reports compliance with aspirin and atorvastatin without associated side effects.  Blood pressure today 126/64 -routinely monitors at home and typically stable.  He was evaluated in the ED on 03/25/2021 with acute onset of chest pain with anterior STEMI requiring emergent cardiac cath with severe single-vessel disease with 100% thrombotic occlusion of proximal LAD stent s/p balloon angioplasty and placed on Brilinta in addition to aspirin.  He has since recovered well and has follow-up with cardiology scheduled 7/15.   Daughter also mentions memory loss concerns that have been present "at least over the past few years" and have gradually declined.  Denies any worsening with recent stroke.  Patient describes his memory loss as more of a delayed recall such as forgetting directions to a specific well-known place but will eventually come to him.  He is unaware of any family history of dementia.  Denies depression or anxiety.  Reports he sleeps well and has adequate appetite. Daughter was under the impression that today's visit was also to evaluate memory loss as she believed PCP placed referral for today's visit (unable to verify this - no referral from PCP received).   No further concerns at this time     ROS:   14 system review of systems performed and negative with exception of those listed in HPI  PMH:  Past Medical History:  Diagnosis Date   Coronary artery disease    History of intravascular stent placement    No pertinent past  medical history     PSH:  Past Surgical History:  Procedure Laterality Date   CORONARY/GRAFT ACUTE MI REVASCULARIZATION N/A 03/25/2021   Procedure: Coronary/Graft Acute MI Revascularization;  Surgeon: Marykay Lex, MD;  Location[facility] INVASIVE CV LAB;  Service: Cardiovascular;  Laterality: N/A;   LEFT HEART CATH N/A 02/02/2012   Procedure: LEFT HEART CATH;  Surgeon: Robynn Pane, MD;  Location: [facility]  CATH LAB;  Service: Cardiovascular;  Laterality: N/A;   LEFT HEART CATH AND CORONARY ANGIOGRAPHY N/A 03/25/2021   Procedure: LEFT HEART CATH AND CORONARY ANGIOGRAPHY;  Surgeon: Marykay Lex, MD;  Location: [facility] INVASIVE CV LAB;  Service: Cardiovascular;  Laterality: N/A;   LEFT HEART CATHETERIZATION WITH CORONARY ANGIOGRAM N/A 05/10/2013   Procedure: LEFT HEART CATHETERIZATION WITH CORONARY ANGIOGRAM;  Surgeon: Lesleigh Noe, MD;  Location: [facility] CATH LAB;  Service: Cardiovascular;  Laterality: N/A;   NO PAST SURGERIES      Social History:  Social History   Socioeconomic History   Marital status: Married    Spouse name: Not on file   Number of children: 3   Years of education: Not on file   Highest education level: Bachelor's degree (e.g., BA, AB, BS)  Occupational History   Not on file  Tobacco Use   Smoking status: Former    Pack years: 0.00   Smokeless tobacco: Never  Substance and Sexual Activity   Alcohol use: No    Alcohol/week: 0.0 standard drinks   Drug use: No   Sexual activity: Yes  Other Topics Concern   Not on file  Social History Narrative   Lives with wife   Social Determinants of Health   Financial Resource Strain: Not on file  Food Insecurity: Not on file  Transportation Needs: Not on file  Physical Activity: Not on file  Stress: Not on file  Social Connections: Not on file  Intimate Partner Violence: Not on file    Family History:  Family History  Problem Relation Age of Onset   Arthritis Mother    Healthy Father    Heart disease Sister     Medications:   Current Outpatient Medications on File Prior to Visit  Medication Sig Dispense Refill   Ascorbic Acid (VITAMIN C PO) Take 500 mg by mouth daily.     aspirin EC 81 MG tablet Take 1 tablet (81 mg total) by mouth daily. Swallow whole. 90 tablet 3   atorvastatin (LIPITOR) 40 MG tablet Take 1 tablet (40 mg total) by mouth daily. 90 tablet 1   Cholecalciferol (VITAMIN D3) 50 MCG (2000 UT) CAPS  Take 2,000 Units by mouth daily.     Coenzyme Q10 (COQ10) 100 MG CAPS Take 100 mg by mouth daily.     latanoprost (XALATAN) 0.005 % ophthalmic solution Place 1 drop into the left eye at bedtime.     MAGNESIUM PO Take 250 mg by mouth daily.     nitroGLYCERIN (NITROSTAT) 0.4 MG SL tablet Place 1 tablet (0.4 mg total) under the tongue every 5 (five) minutes as needed for chest pain. 25 tablet 2   ticagrelor (BRILINTA) 90 MG TABS tablet Take 1 tablet (90 mg total) by mouth 2 (two) times daily. 60 tablet 2   vitamin E 180 MG (400 UNITS) capsule Take 400 Units by mouth daily.     No current facility-administered medications on file prior to visit.    Allergies:   Allergies  Allergen Reactions   Meloxicam Other (See Comments)  Feels bad (reported by [facility] 2021)      OBJECTIVE:  Physical Exam  Vitals:   04/01/21 1052  BP: 126/64  Pulse: 72  Weight: 154 lb 6.4 oz (70 kg)  Height: 5\' 9"  (1.753 m)   Body mass index is 22.8 kg/m. No results found.  Post stroke PHQ 2/9 Depression screen PHQ 2/9 04/01/2021  Decreased Interest 0  Down, Depressed, Hopeless 0  PHQ - 2 Score 0     General: well developed, well nourished, very pleasant elderly African-American male, seated, in no evident distress Head: head normocephalic and atraumatic.   Neck: supple with no carotid or supraclavicular bruits Cardiovascular: regular rate and rhythm, no murmurs Musculoskeletal: no deformity Skin:  no rash/petichiae Vascular:  Normal pulses all extremities   Neurologic Exam Mental Status: Awake and fully alert.  Fluent speech and language.  Oriented to place and time. Recent subjectively mildly impaired and remote memory intact. Attention span, concentration and fund of knowledge appropriate during visit. Mood and affect appropriate.  Cranial Nerves: Fundoscopic exam reveals sharp disc margins. Pupils equal, briskly reactive to light. Extraocular movements full without nystagmus. Visual  fields full to confrontation.  Mild HOH bilaterally. Facial sensation intact. Face, tongue, palate moves normally and symmetrically.  Motor: Normal bulk and tone. Normal strength in all tested extremity muscles Sensory.: intact to touch , pinprick , position and vibratory sensation.  Coordination: Rapid alternating movements normal in all extremities. Finger-to-nose and heel-to-shin performed accurately bilaterally. Gait and Station: Arises from chair without difficulty. Stance is normal. Gait demonstrates normal stride length and balance without use of assistive device.  Reflexes: 1+ and symmetric. Toes downgoing.     NIHSS  0 Modified Rankin  0      ASSESSMENT: Philip Parsons is a 80 y.o. year old male with recent right thalamic lacunar stroke s/p tPA secondary to small vessel disease on 02/15/2021. Vascular risk factors include borderline HTN, HLD, hx of stroke/TIA advanced age and CAD as well as recent STEMI due to occlusion of left anterior descending coronary artery on 03/25/2021.      PLAN:  Right thalamic stroke:  Recovered well without residual deficit.   Continue aspirin 81 mg daily  and atorvastatin 40 mg daily for secondary stroke prevention.   Discussed secondary stroke prevention measures and importance of close PCP follow up for aggressive stroke risk factor management  HTN: BP goal <130/90.  Stable on current regimen per PCP HLD: LDL goal <70. Recent LDL 79 down from 156 -continue atorvastatin 40 mg daily.  Memory loss concerns: Present over the past 3 to 4 years with gradual decline but thankfully no decline with recent stroke.  Likely in setting mild cognitive impairment vs age related. MR brain did show findings of chronic small vessel disease and volume loss and discussed importance of managing blood pressure and cholesterol in hopes of preventing worsening. It was recommended to follow up with PCP as unable to view labs that may have already been completed to look for  reversible causes as present over the past few years such as with dementia panel. If PCP would like our office to further evaluate memory, a new referral will need to be placed.  As memory concerns are only on occasion more so with a delayed recall, I do not believe use of Namenda or Aricept would be beneficial.  May benefit from neurocognitive evaluation which will be deferred to PCP.  Discussed importance of routine physical activity, adequate sleep, healthy diet, managing  stroke risk factors and routine memory exercises.      Follow up in 6 months or call earlier if needed   CC:  [facility] provider: Dr. Pearlean Brownie PCP: Philip Heys, MD    I spent 56 minutes of face-to-face and non-face-to-face time with patient and daughter.  This included previsit chart review including review of recent hospitalization, lab review, study review, electronic health record documentation, and prolonged patient and daughter education regarding recent stroke and likely etiology, secondary stroke prevention and importance of aggressive stroke risk factor management, memory loss concerns and further evaluation and answered all other questions to patient and daughters satisfaction  Ihor Austin, AGNP-BC  [facility] [address] [place], [place] [address]  Phone [phone] Fax [phone] Note: This document was prepared with digital dictation and possible smart phrase technology. Any transcriptional errors that result from this process are unintentional.

## 2021-04-03 ENCOUNTER — Telehealth (HOSPITAL_COMMUNITY): Payer: Self-pay

## 2021-04-03 NOTE — Telephone Encounter (Signed)
Pt insurance is active and benefits verified through Medicare a/b Co-pay 0, DED $233/$233 met, out of pocket 0/0 met, co-insurance 20%. no pre-authorization required. Passport, 04/03/2021'@2' :26pm, REF# [id number]  2ndary insurance is active and benefits verified through [employer]. Co-pay 0, DED $350/0 met, out of pocket $6,000/$20 met, co-insurance 35%. No pre-authorization required. Passport, 04/03/2021'@2' :31pm, REF# [id number]   Will contact patient to see if he is interested in the Cardiac Rehab Program. If interested, patient will need to complete follow up appt. Once completed, patient will be contacted for scheduling upon review by the RN Navigator.

## 2021-04-03 NOTE — Telephone Encounter (Signed)
Pt is not interested in the cardiac rehab program. Closed referral 

## 2021-04-07 ENCOUNTER — Other Ambulatory Visit (HOSPITAL_COMMUNITY): Payer: Self-pay

## 2021-04-07 ENCOUNTER — Other Ambulatory Visit (HOSPITAL_BASED_OUTPATIENT_CLINIC_OR_DEPARTMENT_OTHER): Payer: Self-pay

## 2021-04-07 ENCOUNTER — Telehealth (HOSPITAL_BASED_OUTPATIENT_CLINIC_OR_DEPARTMENT_OTHER): Payer: Self-pay | Admitting: Pharmacist

## 2021-04-07 NOTE — Progress Notes (Signed)
I agree with the above plan 

## 2021-04-07 NOTE — Telephone Encounter (Signed)
Pharmacy Transitions of Care Follow-up Telephone Call  Date of discharge: 03/27/2021  Discharge Diagnosis: STEMI   How have you been since you were released from the hospital? Doing well, no signs or symptoms of bleeding. Has already had one follow-up with neurology since discharge.    Medication changes made at discharge:  - START: Brilinta 90 mg twice daily   Medication changes verified by the patient? Yes    Medication Accessibility:  [facility] [address]   Was the patient provided with refills on discharged medications? Yes   Have all prescriptions been transferred from [facility] to home pharmacy? Yes -completed now    Is the patient able to afford medications? Yes - patient has Nurse, learning disability Notable copays: ~200 per month with just insurance, I have obtained a monthly copay card for him to use with the Brilinta and [facility] this card info to [facility] with the transfers. This copay card will allow for a $5 monthly copay card     Medication Review:  TICAGRELOR (BRILINTA) Ticagrelor 90 mg BID. - Educated patient on potential duration of therapy of aspirin with ticagrelor. - Discussed importance of taking medication around the same time every day, - Reviewed potential DDIs with patient - Advised patient of medications to avoid (NSAIDs, aspirin maintenance doses>100 mg daily) - Educated that Tylenol (acetaminophen) will be the preferred analgesic to prevent risk of bleeding  - Emphasized importance of monitoring for signs and symptoms of bleeding (abnormal bruising, prolonged bleeding, nose bleeds, bleeding from gums, discolored urine, black tarry stools)  - Educated patient to notify doctor if shortness of breath or abnormal heartbeat occur - Advised patient to alert all providers of antiplatelet therapy prior to starting a new medication or having a procedure    Follow-up Appointments:  Patient unaware of Cardiology appointment scheduled for 04/10/2021 at 10:00 am with  [facility]. I have given him the phone number to this office to discuss.   If their condition worsens, is the pt aware to call PCP or go to the Emergency Dept.? Yes

## 2021-04-10 ENCOUNTER — Ambulatory Visit (INDEPENDENT_AMBULATORY_CARE_PROVIDER_SITE_OTHER): Payer: Medicare Other | Admitting: Family

## 2021-04-10 ENCOUNTER — Other Ambulatory Visit: Payer: Self-pay

## 2021-04-10 ENCOUNTER — Encounter (HOSPITAL_BASED_OUTPATIENT_CLINIC_OR_DEPARTMENT_OTHER): Payer: Self-pay | Admitting: Family

## 2021-04-10 VITALS — BP 146/76 | HR 62 | Ht 69.0 in | Wt 151.0 lb

## 2021-04-10 DIAGNOSIS — I2510 Atherosclerotic heart disease of native coronary artery without angina pectoris: Secondary | ICD-10-CM | POA: Diagnosis not present

## 2021-04-10 DIAGNOSIS — Z8673 Personal history of transient ischemic attack (TIA), and cerebral infarction without residual deficits: Secondary | ICD-10-CM | POA: Diagnosis not present

## 2021-04-10 DIAGNOSIS — E785 Hyperlipidemia, unspecified: Secondary | ICD-10-CM | POA: Diagnosis not present

## 2021-04-10 DIAGNOSIS — R7303 Prediabetes: Secondary | ICD-10-CM

## 2021-04-10 DIAGNOSIS — N1832 Chronic kidney disease, stage 3b: Secondary | ICD-10-CM

## 2021-04-10 MED ORDER — TICAGRELOR 90 MG PO TABS: 90.0000 mg | ORAL_TABLET | Freq: Two times a day (BID) | ORAL | 5 refills | Status: DC

## 2021-04-10 NOTE — Patient Instructions (Addendum)
Medication Instructions:  Continue your current medications.   *If you need a refill on your cardiac medications before your next appointment, please call your pharmacy*   Lab Work: Your physician recommends that you return for lab work in 3 months for fasting lipid panel, CMP, hemoglobin A1c  This will let us recheck your blood sugar, kidneys, liver, and cholesterol. Our goal is for your LDL to be less than 70. When checked in May it was 156 and in June had improved to 79.   If you have labs (blood work) drawn today and your tests are completely normal, you will receive your results only by: MyChart Message (if you have MyChart) OR A paper copy in the mail If you have any lab test that is abnormal or we need to change your treatment, we will call you to review the results.   Testing/Procedures: Your EKG today showed stable changes from your heart attack. We will continue to monitor this with periodic EKGs.   Follow-Up: At [facility], you and your health needs are our priority.  As part of our continuing mission to provide you with exceptional heart care, we have created designated Provider Care Teams.  These Care Teams include your primary Cardiologist (physician) and Advanced Practice Providers (APPs -  Physician Assistants and Nurse Practitioners) who all work together to provide you with the care you need, when you need it.  We recommend signing up for the patient portal called "MyChart".  Sign up information is provided on this After Visit Summary.  MyChart is used to connect with patients for Virtual Visits (Telemedicine).  Patients are able to view lab/test results, encounter notes, upcoming appointments, etc.  Non-urgent messages can be sent to your provider as well.   To learn more about what you can do with MyChart, go to [email or web address].    Your next appointment:   3 month(s)  The format for your next appointment:   In Person  Provider:   You may see Lesleigh Noe, MD or one of the following Advanced Practice Providers on your designated Care Team:   Nada Boozer, NP   Other Instructions  Heart Healthy Diet Recommendations: A low-salt diet is recommended. Meats should be grilled, baked, or boiled. Avoid fried foods. Focus on lean protein sources like fish or chicken with vegetables and fruits. The American Heart Association is a Chief Technology Officer!  American Heart Association Diet and Lifeystyle Recommendations   Exercise recommendations: The American Heart Association recommends 150 minutes of moderate intensity exercise weekly. Try 30 minutes of moderate intensity exercise 4-5 times per week. This could include walking, jogging, or swimming. Try to gradually increase to 150 minutes of exercise each week.   For coronary artery disease often called "heart disease" we aim for optimal guideline directed medical therapy. We use the "A, B, C"s to help keep [place] on track!  A = Aspirin 81mg  daily B = Blood pressure control. If your blood pressure at home is consistently more than 130/80, please call C = Cholesterol control. You take Atorvastatin to help control your cholesterol.  D = Don't forget nitroglycerin! This is an emergency tablet to be used if you have chest pain. E = Extras. In your case, this is Brilinta - for one year. We take take for one year after intervention to protect the stent. In 1 year we will consider reduced dose versus discontinuation. It can cause a bit of bruising.

## 2021-04-10 NOTE — Progress Notes (Signed)
Office Visit    Patient Name: Philip Parsons Date of Encounter: 04/10/2021  PCP:  Blair Heys, MD   [facility]  Cardiologist:  Lesleigh Noe, MD  Advanced Practice Provider:  No care team member to display Electrophysiologist:  None    Chief Complaint    Philip Parsons is a 80 y.o. male with a hx of CAD, hypertension, hyperlipidemia presents today for follow-up after PCI  Past Medical History    Past Medical History:  Diagnosis Date   Coronary artery disease    History of intravascular stent placement    No pertinent past medical history    Past Surgical History:  Procedure Laterality Date   CORONARY/GRAFT ACUTE MI REVASCULARIZATION N/A 03/25/2021   Procedure: Coronary/Graft Acute MI Revascularization;  Surgeon: Marykay Lex, MD;  Location: [facility] INVASIVE CV LAB;  Service: Cardiovascular;  Laterality: N/A;   LEFT HEART CATH N/A 02/02/2012   Procedure: LEFT HEART CATH;  Surgeon: Robynn Pane, MD;  Location: [facility] CATH LAB;  Service: Cardiovascular;  Laterality: N/A;   LEFT HEART CATH AND CORONARY ANGIOGRAPHY N/A 03/25/2021   Procedure: LEFT HEART CATH AND CORONARY ANGIOGRAPHY;  Surgeon: Marykay Lex, MD;  Location: [facility] INVASIVE CV LAB;  Service: Cardiovascular;  Laterality: N/A;   LEFT HEART CATHETERIZATION WITH CORONARY ANGIOGRAM N/A 05/10/2013   Procedure: LEFT HEART CATHETERIZATION WITH CORONARY ANGIOGRAM;  Surgeon: Lesleigh Noe, MD;  Location: [facility] CATH LAB;  Service: Cardiovascular;  Laterality: N/A;   NO PAST SURGERIES      Allergies  Allergies  Allergen Reactions   Meloxicam Other (See Comments)    Feels bad (reported by [facility] 2021)    History of Present Illness    Philip Parsons is a 80 y.o. male with a hx of CAD, hypertension, hyperlipidemia last seen while hospitalized.  He had previous stenting to the LAD in 2013.  He has followed with Dr. Katrinka Blazing as an outpatient.  He has previously declined statin therapy.  He  was hospitalized May 2022 with acute right thalamic infarct.  He underwent neurology work-up with recommendation for aspirin and Plavix for 3 weeks and aspirin alone.  He was recommended for home health PT.  Echocardiogram during that admission with LVEF 65-70% with grade 1 diastolic dysfunction.  He presented 03/25/21 with acute onset chest pain while eating lunch.  EKG on EMS arrival showed sinus rhythm with acute ST elevation anterior lateral leads.  Code STEMI was called.  He underwent emergent cardiac catheterization showing severe single-vessel disease with 100% thrombotic occlusion of ostial to proximal LAD stent treated with successful balloon angioplasty. He had moderate residual distal LCx disease (distal LCx 60%, 60% stenosed side branch in 3rd marg) recommended for medication management. Echo 03/26/21 LVEF 50-55%, RWMA, mild asymmetric LVH of basal septal segment, gr1DD, trivial MR.   He presents today for follow-up.  He reports feeling overall well.  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  He is excited as his daughter is coming to visit from [place] today.  Tells me his BP at home well controlled when checked with arm cuff routinely less than 130/80.  We reviewed medication and hospitalization in detail.  He was very appreciative of the explanation. Reports no shortness of breath nor dyspnea on exertion. Reports no chest pain, pressure, or tightness. No edema, orthopnea, PND. Reports no palpitations.  He has been walking for exercise since hospital discharge without difficulty.  EKGs/Labs/Other Studies Reviewed:  The following studies were reviewed today: Echo from 03/26/21:    1. Left ventricular ejection fraction, by estimation, is 50 to 55%. The  left ventricle has low normal function. The left ventricle demonstrates  regional wall motion abnormalities (see scoring diagram/findings for  description). There is mild asymmetric  left ventricular hypertrophy of the basal-septal segment. Left  ventricular  diastolic parameters are consistent with Grade I diastolic dysfunction  (impaired relaxation).   2. Right ventricular systolic function is normal. The right ventricular  size is mildly enlarged. Tricuspid regurgitation signal is inadequate for  assessing PA pressure.   3. The mitral valve is grossly normal. Trivial mitral valve  regurgitation. No evidence of mitral stenosis.   4. The aortic valve is tricuspid. Aortic valve regurgitation is not  visualized. Mild to moderate aortic valve sclerosis/calcification is  present, without any evidence of aortic stenosis.   5. The inferior vena cava is normal in size with greater than 50%  respiratory variability, suggesting right atrial pressure of 3 mmHg.   Comparison(s): Changes from prior study are noted. EF 50-55%. WMAs  described above. _____________   Left cardiac cath on 03/25/21:   CULPRIT LESION Ost LAD to Mid LAD lesion is 100% stenosed. (Very late stent thrombosis in the setting of known in-stent restenosis) Balloon angioplasty was performed using a BALLOON SAPPHIRE 3.0X15 -> followed by a BALLOON SAPPHIRE [place] 3.75X15. Post intervention, there is a ~0% residual stenosis.-No need for additional stent ----------------------------- Dist Cx lesion is 60% stenosed with 60% stenosed side branch in 3rd Mrg. ----------------------------- LV end diastolic pressure is normal. There is no aortic valve stenosis.   SUMMARY Severe single-vessel disease with 100% thrombotic occlusion of the ostial to proximal LAD stent treated successfully with balloon angioplasty restoring TIMI-3 flow. Moderate residual distal LCx disease-treat medically. Normal LVEDP-LV gram not performed     RECOMMENDATIONS Admit to  CVICU but likely transfer out tomorrow, depending results of echocardiogram, could consider fast-track discharge. He has apparently been on aspirin and Plavix because of a stroke, would continue Was also apparently started on  statin, will continue Lipitor 40 mg Otherwise has been very reluctant to take other medications.  We will hold off for now.   Diagnostic Dominance: Right      Intervention         EKG:  EKG is ordered today.  The ekg ordered today demonstrates normal sinus and 62 bpm with anterior T wave inversion.  Recent Labs: 03/25/2021: ALT 17 03/26/2021: BUN 25; Creatinine, Ser 1.31; Hemoglobin 10.9; Platelets 237; Potassium 3.5; Sodium 141  Recent Lipid Panel    Component Value Date/Time   CHOL 137 03/25/2021 1506   CHOL 235 (H) 07/30/2019 0826   TRIG 31 03/25/2021 1506   HDL 52 03/25/2021 1506   HDL 59 07/30/2019 0826   CHOLHDL 2.6 03/25/2021 1506   VLDL 6 03/25/2021 1506   LDLCALC 79 03/25/2021 1506   LDLCALC 154 (H) 07/30/2019 0826   LDLDIRECT 152.5 10/15/2013 1131    Home Medications   Current Meds  Medication Sig   Ascorbic Acid (VITAMIN C PO) Take 500 mg by mouth daily.   aspirin EC 81 MG tablet Take 1 tablet (81 mg total) by mouth daily. Swallow whole.   atorvastatin (LIPITOR) 40 MG tablet Take 1 tablet (40 mg total) by mouth daily.   Cholecalciferol (VITAMIN D3) 50 MCG (2000 UT) CAPS Take 2,000 Units by mouth daily.   Coenzyme Q10 (COQ10) 100 MG CAPS Take 100 mg by mouth  daily.   latanoprost (XALATAN) 0.005 % ophthalmic solution Place 1 drop into the left eye at bedtime.   MAGNESIUM PO Take 250 mg by mouth daily.   nitroGLYCERIN (NITROSTAT) 0.4 MG SL tablet Place 1 tablet (0.4 mg total) under the tongue every 5 (five) minutes as needed for chest pain.   vitamin E 180 MG (400 UNITS) capsule Take 400 Units by mouth daily.   [DISCONTINUED] ticagrelor (BRILINTA) 90 MG TABS tablet Take 1 tablet (90 mg total) by mouth 2 (two) times daily.     Review of Systems      All other systems reviewed and are otherwise negative except as noted above.  Physical Exam    VS:  BP (!) 146/76   Pulse 62   Ht 5\' 9"  (1.753 m)   Wt 151 lb (68.5 kg)   BMI 22.30 kg/m  , BMI Body mass  index is 22.3 kg/m.  Wt Readings from Last 3 Encounters:  04/10/21 151 lb (68.5 kg)  04/01/21 154 lb 6.4 oz (70 kg)  03/25/21 165 lb (74.8 kg)    GEN: Well nourished, well developed, in no acute distress. HEENT: normal. Neck: Supple, no JVD, carotid bruits, or masses. Cardiac: RRR, no murmurs, rubs, or gallops. No clubbing, cyanosis, edema.  Radials/PT 2+ and equal bilaterally.  Respiratory:  Respirations regular and unlabored, clear to auscultation bilaterally. GI: Soft, nontender, nondistended. MS: No deformity or atrophy. Skin: Warm and dry, no rash. Neuro:  Strength and sensation are intact. Psych: Normal affect.  Assessment & Plan    CAD - Stable with no anginal symptoms. No indication for ischemic evaluation.  R radial cath site healing appropriately. GDMT with recommendation for DAPT for at least 1 year on Brilinta/Aspirin. Reports mild bruising which is overall not bothersome. No shortness of breath. Additional GDMT includes Atorvastatin. No beta blocker due to baseline low heart rate.   HLD, LDL goal <70 - Continue Atorvastatin 40mg  QD. Repeat lipid panel, CMP in 3 months. If LDL not at goal, consider increased dose vs addition of Zetia. Discussed the importance of cholesterol control to prevent further cardiovascular disease and he verbalized understanding.   Prediabetes - Recent A1c of 6.1.  Low carbohydrate diet and regular cardiovascular exercise encouraged. Repeat A1c in 3 months.   History of CVA - Continue to follow with neurology. Continue Aspirin, Atorvastatin.   Disposition: Follow up in 3 month(s) with Dr. 03/27/21 or APP.  Signed, , NP 04/10/2021, 1:20 PM [facility]

## 2021-04-20 ENCOUNTER — Telehealth: Payer: Self-pay | Admitting: Interventional Cardiology

## 2021-04-20 NOTE — Telephone Encounter (Signed)
New Message:     Patient's daughter wants to know if theer is a Patient Assistance Program for Atorvastatin and Brilinta please? She says the medicine is so expensive.

## 2021-04-20 NOTE — Telephone Encounter (Signed)
**Note De-Identified  Obfuscation** The pts daughter Philip Parsons) states that [facility] telling [facility] that the pt cannot get another refill of Atorvastatin until August and she is concerned that he will run out. She is advised that we [facility] day supply on [facility]/27/21 with 1 refill which is enough for 6 months. She states that she will call her dad to see if he picked up a 30 day supply or a 90 day supply of his Atorvastatin and will call [facility] if he only picked up a 30 day supply.  We discussed [place]st [place] for [place]il[place].  I gave her their phone number and advised her to call them with questions about their Brilinta program and the pts eligibility to be approved for the program.  Philip Parsons is aware that the pt can apply for [place]st with [place] over [place] phone and to call us to let us know if the pt was approved so we can send his BrilintaRX to [facility] which is the mail pharmacy for the [place] program.  Philip Parsons is also aware to call us back if the pt is not eligible for asst with Brilinta and we can discuss the pt switching to generic Clopidogrel if Dr Katrinka Blazing is in agreement.  She verbalized understanding and thanked me for calling her with options to lower the cost for the pts antiplatelet.

## 2021-04-21 ENCOUNTER — Telehealth: Payer: Self-pay

## 2021-04-21 MED ORDER — TICAGRELOR 90 MG PO TABS: 90.0000 mg | ORAL_TABLET | Freq: Two times a day (BID) | ORAL | 3 refills | Status: DC

## 2021-04-21 NOTE — Telephone Encounter (Signed)
Patient's daughter Philip Parsons was calling to talk with Ms. Larita Fife

## 2021-04-21 NOTE — Telephone Encounter (Signed)
**Note De-Identified  Obfuscation** See phone from 07/25 for update.

## 2021-04-21 NOTE — Telephone Encounter (Signed)
**Note De-Identified  Obfuscation** The pts daughter [name]) called me back and stated that the pts yearly [place] is greater [place] for [place].  She states that she called [employer] and after discussing the cost for Brilinta with them she and the pt have decided to get the pts medication from their mail pharmacy as the cost for a 90 day supply of Brilinta is $125 which is more affordable for him.  She is requesting that I call [phone] which is the phone number for [facility] mail pharmacy. I called the number and s/w Leanne at [facility] who advised that I could e-scribe the pts Brilinta RX to them for a 90 day supply and that they will ship it out to the pt as soon as I send the refill request to them.  I have e-scribed the pts Brilinta to [facility] to fill for #180 with 3 refills.

## 2021-04-22 ENCOUNTER — Telehealth: Payer: Self-pay | Admitting: Interventional Cardiology

## 2021-04-22 MED ORDER — ATORVASTATIN CALCIUM 40 MG PO TABS: 40.0000 mg | ORAL_TABLET | Freq: Every day | ORAL | 3 refills | Status: DC

## 2021-04-22 NOTE — Telephone Encounter (Signed)
Pt's daughter is calling to go over her dad's medication with Larita Fife

## 2021-04-22 NOTE — Telephone Encounter (Signed)
**Note De-Identified  Obfuscation** The pts daughter (DPR) Selena Batten is calling to request that I e-scribe the pts Atorvastatin refill to [facility] as he will get it from them free of charge.  I have e-scribed Atorvastatin 40 mg #90 with 3 refills to [facility] as requested.

## 2021-04-23 DIAGNOSIS — H401112 Primary open-angle glaucoma, right eye, moderate stage: Secondary | ICD-10-CM | POA: Diagnosis not present

## 2021-04-30 DIAGNOSIS — R7303 Prediabetes: Secondary | ICD-10-CM | POA: Diagnosis not present

## 2021-04-30 DIAGNOSIS — R413 Other amnesia: Secondary | ICD-10-CM | POA: Diagnosis not present

## 2021-04-30 DIAGNOSIS — Z1389 Encounter for screening for other disorder: Secondary | ICD-10-CM | POA: Diagnosis not present

## 2021-04-30 DIAGNOSIS — Z8673 Personal history of transient ischemic attack (TIA), and cerebral infarction without residual deficits: Secondary | ICD-10-CM | POA: Diagnosis not present

## 2021-04-30 DIAGNOSIS — I25119 Atherosclerotic heart disease of native coronary artery with unspecified angina pectoris: Secondary | ICD-10-CM | POA: Diagnosis not present

## 2021-04-30 DIAGNOSIS — N1831 Chronic kidney disease, stage 3a: Secondary | ICD-10-CM | POA: Diagnosis not present

## 2021-04-30 DIAGNOSIS — I208 Other forms of angina pectoris: Secondary | ICD-10-CM | POA: Diagnosis not present

## 2021-04-30 DIAGNOSIS — E7800 Pure hypercholesterolemia, unspecified: Secondary | ICD-10-CM | POA: Diagnosis not present

## 2021-04-30 DIAGNOSIS — Z0000 Encounter for general adult medical examination without abnormal findings: Secondary | ICD-10-CM | POA: Diagnosis not present

## 2021-05-13 ENCOUNTER — Telehealth: Payer: Self-pay | Admitting: Interventional Cardiology

## 2021-05-13 NOTE — Telephone Encounter (Signed)
Pt c/o medication issue:  1. Name of Medication:  aspirin EC 81 MG tablet ticagrelor (BRILINTA) 90 MG TABS tablet atorvastatin (LIPITOR) 40 MG tablet  2. How are you currently taking this medication (dosage and times per day)? Brilinta 1 tablet twice a day, 1 tablet daily for the other medications  3. Are you having a reaction (difficulty breathing--STAT)? no  4. What is your medication issue? Patient states he is having blood spots on hands and arms, and one under his right eye. He states he has not had any injuries to cause them and is wondering if he is on too much blood thinner.

## 2021-05-13 NOTE — Telephone Encounter (Signed)
Spoke with the patient who states that he has had some mild bruising ever since starting on the blood thinners. He states that he has a few spots on his arms about the size of pencil erasers. He woke up this morning with a dark spot under his eye and he does not recall injuring the area. Patient would like to know if he can reduce the dose of either his aspirin or brilinta. I advised him that he needed to continue on his current doses. He will let us know if bruising worsens or he develops any new signs of bleeding or bruising.

## 2021-05-14 ENCOUNTER — Other Ambulatory Visit: Payer: Self-pay

## 2021-05-14 ENCOUNTER — Other Ambulatory Visit: Payer: Medicare Other

## 2021-05-14 ENCOUNTER — Other Ambulatory Visit: Payer: Self-pay | Admitting: *Deleted

## 2021-05-14 DIAGNOSIS — T148XXA Other injury of unspecified body region, initial encounter: Secondary | ICD-10-CM

## 2021-05-14 DIAGNOSIS — I2510 Atherosclerotic heart disease of native coronary artery without angina pectoris: Secondary | ICD-10-CM

## 2021-05-14 LAB — CBC
Hematocrit: 37 % — ABNORMAL LOW (ref 37.5–51.0)
Hemoglobin: 12.2 g/dL — ABNORMAL LOW (ref 13.0–17.7)
MCH: 29.8 pg (ref 26.6–33.0)
MCHC: 33 g/dL (ref 31.5–35.7)
MCV: 91 fL (ref 79–97)
Platelets: 333 10*3/uL (ref 150–450)
RBC: 4.09 x10E6/uL — ABNORMAL LOW (ref 4.14–5.80)
RDW: 13.4 % (ref 11.6–15.4)
WBC: 7.9 10*3/uL (ref 3.4–10.8)

## 2021-05-14 NOTE — Telephone Encounter (Signed)
Labs reviewed and looked fine.  Hgb improved since last check.  Spoke with pt and made him aware to continue Brilinta and ASA at least until he sees Vernona Rieger in October.  Reviewed we want to do this due to the increased risk of stent thrombosis if we switch him to Plavix too early.  Pt verbalized understanding and was appreciative for call.

## 2021-05-16 ENCOUNTER — Emergency Department (HOSPITAL_COMMUNITY)
Admission: EM | Admit: 2021-05-16 | Discharge: 2021-05-16 | Disposition: A | Discharge status: Home / Self Care | Payer: Medicare Other | Attending: Emergency Medicine | Admitting: Emergency Medicine

## 2021-05-16 ENCOUNTER — Other Ambulatory Visit: Payer: Self-pay

## 2021-05-16 ENCOUNTER — Encounter (HOSPITAL_COMMUNITY): Payer: Self-pay | Admitting: Emergency Medicine

## 2021-05-16 DIAGNOSIS — H5712 Ocular pain, left eye: Secondary | ICD-10-CM | POA: Diagnosis not present

## 2021-05-16 DIAGNOSIS — R233 Spontaneous ecchymoses: Secondary | ICD-10-CM

## 2021-05-16 DIAGNOSIS — R238 Other skin changes: Secondary | ICD-10-CM

## 2021-05-16 DIAGNOSIS — S0511XA Contusion of eyeball and orbital tissues, right eye, initial encounter: Secondary | ICD-10-CM | POA: Insufficient documentation

## 2021-05-16 DIAGNOSIS — S59912A Unspecified injury of left forearm, initial encounter: Secondary | ICD-10-CM | POA: Diagnosis present

## 2021-05-16 DIAGNOSIS — Z7982 Long term (current) use of aspirin: Secondary | ICD-10-CM | POA: Diagnosis not present

## 2021-05-16 DIAGNOSIS — I2510 Atherosclerotic heart disease of native coronary artery without angina pectoris: Secondary | ICD-10-CM | POA: Diagnosis not present

## 2021-05-16 DIAGNOSIS — Z87891 Personal history of nicotine dependence: Secondary | ICD-10-CM | POA: Diagnosis not present

## 2021-05-16 DIAGNOSIS — X58XXXA Exposure to other specified factors, initial encounter: Secondary | ICD-10-CM | POA: Insufficient documentation

## 2021-05-16 DIAGNOSIS — S5012XA Contusion of left forearm, initial encounter: Secondary | ICD-10-CM | POA: Diagnosis not present

## 2021-05-16 DIAGNOSIS — H1032 Unspecified acute conjunctivitis, left eye: Secondary | ICD-10-CM

## 2021-05-16 LAB — CBC
HCT: 35.4 % — ABNORMAL LOW (ref 39.0–52.0)
Hemoglobin: 11.5 g/dL — ABNORMAL LOW (ref 13.0–17.0)
MCH: 30.7 pg (ref 26.0–34.0)
MCHC: 32.5 g/dL (ref 30.0–36.0)
MCV: 94.4 fL (ref 80.0–100.0)
Platelets: 291 10*3/uL (ref 150–400)
RBC: 3.75 MIL/uL — ABNORMAL LOW (ref 4.22–5.81)
RDW: 13.2 % (ref 11.5–15.5)
WBC: 7.6 10*3/uL (ref 4.0–10.5)
nRBC: 0 % (ref 0.0–0.2)

## 2021-05-16 MED ORDER — TETRACAINE HCL 0.5 % OP SOLN
2.0000 [drp] | Freq: Once | OPHTHALMIC | Status: AC
  Administered 2021-05-16: 2 [drp] via OPHTHALMIC
  Filled 2021-05-16: qty 4

## 2021-05-16 MED ORDER — TOBRAMYCIN 0.3 % OP SOLN
2.0000 [drp] | Freq: Once | OPHTHALMIC | Status: AC
  Administered 2021-05-16: 2 [drp] via OPHTHALMIC
  Filled 2021-05-16: qty 5

## 2021-05-16 NOTE — ED Provider Notes (Addendum)
[facility] EMERGENCY DEPARTMENT Provider Note   CSN: [id number] Arrival date & time: 05/16/21  1909     History Chief Complaint  Patient presents with   Eye Pain    Bleeding/Bruising    Philip Parsons is a 80 y.o. male.  Patient c/o recent bruising with small bruise to left forearm, and in past 1-2  days noticing bruised area beneath right eye. States was concerned as after recent cath/stent, has been on asa and brillinta. Denies other abnormal bruising or bleeding - no nosebleeds, no blood in stool or urine. States early this AM he had episode of dull pain to left eye takes took an aspirin and it improved, now with v mild discomfort. Denies any acute change in vision - states his vision in left eye is quite poor at baseline, but denies acute change or worsening.  No eye drainage or matting. No light sensitivity. No amaurosis, visual field deficit, or other acute change in vision. No fb sensation.   The history is provided by the patient.      Past Medical History:  Diagnosis Date   Coronary artery disease    History of intravascular stent placement    No pertinent past medical history     Patient Active Problem List   Diagnosis Date Noted   Acute ST elevation myocardial infarction (STEMI) due to occlusion of left anterior descending (LAD) coronary artery (HCC) 03/25/2021   Coronary stent thrombosis 03/25/2021   Acute ST elevation myocardial infarction (STEMI) of anterior wall (HCC) 03/25/2021   Acute CVA (cerebrovascular accident) (HCC) 02/15/2021   Gout 02/15/2021   AKI (acute kidney injury) (HCC) 02/15/2021   Hyperlipidemia with target LDL less than 70 - refuses medications 10/17/2013   Coronary stent restenosis due to progression of disease 05/10/2013   Coronary atherosclerosis of native coronary artery 05/10/2013   Myocardial infarction, anterolateral wall, acute (HCC) 02/03/2012    Past Surgical History:  Procedure Laterality Date   CORONARY/GRAFT  ACUTE MI REVASCULARIZATION N/A 03/25/2021   Procedure: Coronary/Graft Acute MI Revascularization;  Surgeon: Marykay Lex, MD;  Location: [facility] INVASIVE CV LAB;  Service: Cardiovascular;  Laterality: N/A;   LEFT HEART CATH N/A 02/02/2012   Procedure: LEFT HEART CATH;  Surgeon: Robynn Pane, MD;  Location: [facility] CATH LAB;  Service: Cardiovascular;  Laterality: N/A;   LEFT HEART CATH AND CORONARY ANGIOGRAPHY N/A 03/25/2021   Procedure: LEFT HEART CATH AND CORONARY ANGIOGRAPHY;  Surgeon: Marykay Lex, MD;  Location: [facility] INVASIVE CV LAB;  Service: Cardiovascular;  Laterality: N/A;   LEFT HEART CATHETERIZATION WITH CORONARY ANGIOGRAM N/A 05/10/2013   Procedure: LEFT HEART CATHETERIZATION WITH CORONARY ANGIOGRAM;  Surgeon: Lesleigh Noe, MD;  Location: [facility] CATH LAB;  Service: Cardiovascular;  Laterality: N/A;   NO PAST SURGERIES         Family History  Problem Relation Age of Onset   Arthritis Mother    Healthy Father    Heart disease Sister     Social History   Tobacco Use   Smoking status: Former   Smokeless tobacco: Never  Substance Use Topics   Alcohol use: No    Alcohol/week: 0.0 standard drinks   Drug use: No    Home Medications Prior to Admission medications   Medication Sig Start Date End Date Taking? Authorizing Provider  Ascorbic Acid (VITAMIN C PO) Take 500 mg by mouth daily.    [provider]  aspirin EC 81 MG tablet Take 1 tablet (81  mg total) by mouth daily. Swallow whole. 02/17/21 02/17/22  Pokhrel, Rebekah [place], MD  atorvastatin (LIPITOR) 40 MG tablet Take 1 tablet (40 mg total) by mouth daily. 04/22/21 04/22/22  Lyn Records, MD  Cholecalciferol (VITAMIN D3) 50 MCG (2000 UT) CAPS Take 2,000 Units by mouth daily.    [provider]  Coenzyme Q10 (COQ10) 100 MG CAPS Take 100 mg by mouth daily.    [provider]  latanoprost (XALATAN) 0.005 % ophthalmic solution Place 1 drop into the left eye at bedtime. 01/20/21   [provider]   MAGNESIUM PO Take 250 mg by mouth daily.    [provider]  nitroGLYCERIN (NITROSTAT) 0.4 MG SL tablet Place 1 tablet (0.4 mg total) under the tongue every 5 (five) minutes as needed for chest pain. 03/27/21   Cyndi Bender, NP  ticagrelor (BRILINTA) 90 MG TABS tablet Take 1 tablet (90 mg total) by mouth 2 (two) times daily. 04/21/21   Lyn Records, MD  vitamin E 180 MG (400 UNITS) capsule Take 400 Units by mouth daily.    [provider]    Allergies    Meloxicam  Review of Systems   Review of Systems  Constitutional:  Negative for fever.  HENT:  Negative for nosebleeds.   Eyes:  Negative for photophobia, discharge and visual disturbance.  Gastrointestinal:  Negative for blood in stool.  Neurological:  Negative for weakness, numbness and headaches.  Hematological:  Bruises/bleeds easily.   Physical Exam Updated Vital Signs BP (!) 150/69 (BP Location: Left Arm)   Pulse 63   Temp 98 F (36.7 C) (Oral)   Resp 16   Ht 1.753 m (5\' 9" )   Wt 70 kg   SpO2 99%   BMI 22.79 kg/m   Physical Exam Vitals and nursing note reviewed.  Constitutional:      Appearance: Normal appearance. He is well-developed.  HENT:     Head: Atraumatic.     Nose: Nose normal.     Mouth/Throat:     Mouth: Mucous membranes are moist.     Pharynx: Oropharynx is clear.  Eyes:     General: No scleral icterus.    Extraocular Movements: Extraocular movements intact.     Pupils: Pupils are equal, round, and reactive to light.     Comments: Left conjunctiva mildly injected. Pupils equal, briskly/painlessly reactive, no corneal clouding. Fundus, clear disc margins, no acute hemorrhage noted, no hyphema. Tetracaine drops, pressure checked x 2 w tonopen, with readings of 19, and then 18.   Neck:     Trachea: No tracheal deviation.  Cardiovascular:     Rate and Rhythm: Normal rate.     Pulses: Normal pulses.  Pulmonary:     Effort: Pulmonary effort is normal. No accessory muscle usage or  respiratory distress.  Genitourinary:    Comments: No cva tenderness. Musculoskeletal:        General: No swelling.     Cervical back: Normal range of motion and neck supple. No rigidity.  Skin:    General: Skin is warm and dry.     Findings: No rash.     Comments: Small ~ 1 cm area bruising to right forearm, and ~ 5 mm by 2 cm area bruising beneath right eye. No subconjunctival hem. No diffuse petechia.   Neurological:     Mental Status: He is alert.     Comments: Alert, speech clear.   Psychiatric:        Mood and Affect:  Mood normal.    ED Results / Procedures / Treatments   Labs (all labs ordered are listed, but only abnormal results are displayed) Results for orders placed or performed during the hospital encounter of 05/16/21  CBC  Result Value Ref Range   WBC 7.6 4.0 - 10.5 K/uL   RBC 3.75 (L) 4.22 - 5.81 MIL/uL   Hemoglobin 11.5 (L) 13.0 - 17.0 g/dL   HCT 29.9 (L) 37.1 - 69.6 %   MCV 94.4 80.0 - 100.0 fL   MCH 30.7 26.0 - 34.0 pg   MCHC 32.5 30.0 - 36.0 g/dL   RDW 78.9 38.1 - 01.7 %   Platelets 291 150 - 400 K/uL   nRBC 0.0 0.0 - 0.2 %      EKG None  Radiology No results found.  Procedures Procedures   Medications Ordered in ED Medications  tobramycin (TOBREX) 0.3 % ophthalmic solution 2 drop (has no administration in time range)  tetracaine (PONTOCAINE) 0.5 % ophthalmic solution 2 drop (2 drops Left Eye Given by Other 05/16/21 2113)    ED Course  I have reviewed the triage vital signs and the nursing notes.  Pertinent labs & imaging results that were available during my care of the patient were reviewed by me and considered in my medical decision making (see chart for details).    MDM Rules/Calculators/A&P                          Reviewed nursing notes and prior charts for additional history.   Pt reports visual acuity seems at his baseline.   Will check cbc/plt count.   Left conj injected. Pressure readings are normal.   Labs  reviewed/interpreted by me - plt normal.   Tobrex gtts.   Rec f/u pcp and his ophthalmologist this week.  Return precautions provided.     Final Clinical Impression(s) / ED Diagnoses Final diagnoses:  Easy bruising  Left eye pain  Acute conjunctivitis of left eye, unspecified acute conjunctivitis type    Rx / DC Orders ED Discharge Orders     None         Cathren Laine, MD 05/16/21 2136

## 2021-05-16 NOTE — ED Triage Notes (Signed)
Patient reports left eye pain onset last night , denies injury , no vision loss.

## 2021-05-16 NOTE — Discharge Instructions (Addendum)
It was our pleasure to provide your ER care today - we hope that you feel better.  Use tobrex eye drops, 1-2 drops in left eye 4x/day for the next 5 days.   For easy bruising, follow up with your primary care doctor in the next couple weeks.   For recent eye symptoms, follow up with your eye doctor this coming week - call office Monday to arrange follow up appointment.   Return to ER if worse, new symptoms, new, worsening or severe eye pain, swelling/redness about eye, change in vision, fevers, or other concern.

## 2021-05-16 NOTE — ED Notes (Signed)
Pt discharged and ambulated out of the ED without difficulty. 

## 2021-05-16 NOTE — ED Notes (Signed)
Visual acuity performed and pt given eye drops to take home with him.

## 2021-05-18 ENCOUNTER — Observation Stay (HOSPITAL_COMMUNITY)
Admission: EM | Admit: 2021-05-18 | Discharge: 2021-05-19 | Disposition: A | Discharge status: Home / Self Care | Payer: Medicare Other | Attending: Family Medicine | Admitting: Family Medicine

## 2021-05-18 ENCOUNTER — Emergency Department (HOSPITAL_COMMUNITY): Payer: Medicare Other

## 2021-05-18 ENCOUNTER — Encounter (HOSPITAL_COMMUNITY): Payer: Self-pay

## 2021-05-18 ENCOUNTER — Telehealth: Payer: Self-pay | Admitting: Interventional Cardiology

## 2021-05-18 ENCOUNTER — Inpatient Hospital Stay (HOSPITAL_COMMUNITY): Payer: Medicare Other

## 2021-05-18 ENCOUNTER — Other Ambulatory Visit: Payer: Self-pay

## 2021-05-18 DIAGNOSIS — Z20822 Contact with and (suspected) exposure to covid-19: Secondary | ICD-10-CM | POA: Diagnosis not present

## 2021-05-18 DIAGNOSIS — Z87891 Personal history of nicotine dependence: Secondary | ICD-10-CM | POA: Insufficient documentation

## 2021-05-18 DIAGNOSIS — Z7982 Long term (current) use of aspirin: Secondary | ICD-10-CM | POA: Insufficient documentation

## 2021-05-18 DIAGNOSIS — R4781 Slurred speech: Secondary | ICD-10-CM | POA: Diagnosis not present

## 2021-05-18 DIAGNOSIS — I639 Cerebral infarction, unspecified: Principal | ICD-10-CM | POA: Diagnosis present

## 2021-05-18 DIAGNOSIS — R2689 Other abnormalities of gait and mobility: Secondary | ICD-10-CM | POA: Insufficient documentation

## 2021-05-18 DIAGNOSIS — R7303 Prediabetes: Secondary | ICD-10-CM | POA: Insufficient documentation

## 2021-05-18 DIAGNOSIS — I25118 Atherosclerotic heart disease of native coronary artery with other forms of angina pectoris: Secondary | ICD-10-CM

## 2021-05-18 DIAGNOSIS — R29898 Other symptoms and signs involving the musculoskeletal system: Secondary | ICD-10-CM | POA: Diagnosis not present

## 2021-05-18 DIAGNOSIS — H3412 Central retinal artery occlusion, left eye: Secondary | ICD-10-CM | POA: Diagnosis not present

## 2021-05-18 DIAGNOSIS — R9431 Abnormal electrocardiogram [ECG] [EKG]: Secondary | ICD-10-CM | POA: Diagnosis not present

## 2021-05-18 DIAGNOSIS — R29818 Other symptoms and signs involving the nervous system: Secondary | ICD-10-CM | POA: Diagnosis not present

## 2021-05-18 DIAGNOSIS — I6523 Occlusion and stenosis of bilateral carotid arteries: Secondary | ICD-10-CM | POA: Diagnosis not present

## 2021-05-18 DIAGNOSIS — I2510 Atherosclerotic heart disease of native coronary artery without angina pectoris: Secondary | ICD-10-CM | POA: Diagnosis not present

## 2021-05-18 DIAGNOSIS — R531 Weakness: Secondary | ICD-10-CM

## 2021-05-18 DIAGNOSIS — E785 Hyperlipidemia, unspecified: Secondary | ICD-10-CM | POA: Diagnosis present

## 2021-05-18 DIAGNOSIS — I6310 Cerebral infarction due to embolism of unspecified precerebral artery: Secondary | ICD-10-CM

## 2021-05-18 LAB — DIFFERENTIAL
Abs Immature Granulocytes: 0.03 10*3/uL (ref 0.00–0.07)
Basophils Absolute: 0 10*3/uL (ref 0.0–0.1)
Basophils Relative: 0 %
Eosinophils Absolute: 0.8 10*3/uL — ABNORMAL HIGH (ref 0.0–0.5)
Eosinophils Relative: 8 %
Immature Granulocytes: 0 %
Lymphocytes Relative: 17 %
Lymphs Abs: 1.6 10*3/uL (ref 0.7–4.0)
Monocytes Absolute: 1.1 10*3/uL — ABNORMAL HIGH (ref 0.1–1.0)
Monocytes Relative: 12 %
Neutro Abs: 5.6 10*3/uL (ref 1.7–7.7)
Neutrophils Relative %: 63 %

## 2021-05-18 LAB — COMPREHENSIVE METABOLIC PANEL
ALT: 18 U/L (ref 0–44)
AST: 23 U/L (ref 15–41)
Albumin: 3 g/dL — ABNORMAL LOW (ref 3.5–5.0)
Alkaline Phosphatase: 61 U/L (ref 38–126)
Anion gap: 7 (ref 5–15)
BUN: 21 mg/dL (ref 8–23)
CO2: 28 mmol/L (ref 22–32)
Calcium: 8.5 mg/dL — ABNORMAL LOW (ref 8.9–10.3)
Chloride: 104 mmol/L (ref 98–111)
Creatinine, Ser: 1.44 mg/dL — ABNORMAL HIGH (ref 0.61–1.24)
GFR, Estimated: 49 mL/min — ABNORMAL LOW (ref >60)
Glucose, Bld: 92 mg/dL (ref 70–99)
Potassium: 3.5 mmol/L (ref 3.5–5.1)
Sodium: 139 mmol/L (ref 135–145)
Total Bilirubin: 0.5 mg/dL (ref 0.3–1.2)
Total Protein: 6.5 g/dL (ref 6.5–8.1)

## 2021-05-18 LAB — I-STAT CHEM 8, ED
BUN: 22 mg/dL (ref 8–23)
Calcium, Ion: 1.07 mmol/L — ABNORMAL LOW (ref 1.15–1.40)
Chloride: 105 mmol/L (ref 98–111)
Creatinine, Ser: 1.4 mg/dL — ABNORMAL HIGH (ref 0.61–1.24)
Glucose, Bld: 92 mg/dL (ref 70–99)
HCT: 34 % — ABNORMAL LOW (ref 39.0–52.0)
Hemoglobin: 11.6 g/dL — ABNORMAL LOW (ref 13.0–17.0)
Potassium: 3.5 mmol/L (ref 3.5–5.1)
Sodium: 141 mmol/L (ref 135–145)
TCO2: 28 mmol/L (ref 22–32)

## 2021-05-18 LAB — CBC
HCT: 36.7 % — ABNORMAL LOW (ref 39.0–52.0)
HCT: 36.8 % — ABNORMAL LOW (ref 39.0–52.0)
Hemoglobin: 11.7 g/dL — ABNORMAL LOW (ref 13.0–17.0)
Hemoglobin: 11.8 g/dL — ABNORMAL LOW (ref 13.0–17.0)
MCH: 30.2 pg (ref 26.0–34.0)
MCH: 30.1 pg (ref 26.0–34.0)
MCHC: 31.9 g/dL (ref 30.0–36.0)
MCHC: 32.1 g/dL (ref 30.0–36.0)
MCV: 94.6 fL (ref 80.0–100.0)
MCV: 93.9 fL (ref 80.0–100.0)
Platelets: 288 10*3/uL (ref 150–400)
Platelets: 269 10*3/uL (ref 150–400)
RBC: 3.88 MIL/uL — ABNORMAL LOW (ref 4.22–5.81)
RBC: 3.92 MIL/uL — ABNORMAL LOW (ref 4.22–5.81)
RDW: 13.3 % (ref 11.5–15.5)
RDW: 13.3 % (ref 11.5–15.5)
WBC: 9.2 10*3/uL (ref 4.0–10.5)
WBC: 8.8 10*3/uL (ref 4.0–10.5)
nRBC: 0 % (ref 0.0–0.2)
nRBC: 0 % (ref 0.0–0.2)

## 2021-05-18 LAB — CBG MONITORING, ED: Glucose-Capillary: 93 mg/dL (ref 70–99)

## 2021-05-18 LAB — CREATININE, SERUM
Creatinine, Ser: 1.29 mg/dL — ABNORMAL HIGH (ref 0.61–1.24)
GFR, Estimated: 55 mL/min — ABNORMAL LOW (ref >60)

## 2021-05-18 LAB — PROTIME-INR
INR: 1 (ref 0.8–1.2)
Prothrombin Time: 13.4 seconds (ref 11.4–15.2)

## 2021-05-18 LAB — APTT: aPTT: 31 seconds (ref 24–36)

## 2021-05-18 MED ORDER — COQ10 100 MG PO CAPS: 100.0000 mg | ORAL_CAPSULE | Freq: Every day | ORAL | Status: DC

## 2021-05-18 MED ORDER — ACETAMINOPHEN 160 MG/5ML PO SOLN: 650.0000 mg | ORAL | Status: DC | PRN

## 2021-05-18 MED ORDER — ATORVASTATIN CALCIUM 40 MG PO TABS
40.0000 mg | ORAL_TABLET | Freq: Every day | ORAL | Status: DC
  Administered 2021-05-18 – 2021-05-19 (×2): 40 mg via ORAL
  Filled 2021-05-18 (×2): qty 1

## 2021-05-18 MED ORDER — ACETAMINOPHEN 325 MG PO TABS: 650.0000 mg | ORAL_TABLET | ORAL | Status: DC | PRN

## 2021-05-18 MED ORDER — VITAMIN D 25 MCG (1000 UNIT) PO TABS
2000.0000 [IU] | ORAL_TABLET | Freq: Every day | ORAL | Status: DC
  Administered 2021-05-19: 2000 [IU] via ORAL
  Filled 2021-05-18: qty 2

## 2021-05-18 MED ORDER — STROKE: EARLY STAGES OF RECOVERY BOOK
Freq: Once | Status: AC
  Administered 2021-05-18: 1
  Filled 2021-05-18: qty 1

## 2021-05-18 MED ORDER — ENOXAPARIN SODIUM 40 MG/0.4ML IJ SOSY
40.0000 mg | PREFILLED_SYRINGE | INTRAMUSCULAR | Status: DC
  Filled 2021-05-18: qty 0.4

## 2021-05-18 MED ORDER — IOHEXOL 350 MG/ML SOLN
60.0000 mL | Freq: Once | INTRAVENOUS | Status: AC | PRN
  Administered 2021-05-18: 60 mL via INTRAVENOUS

## 2021-05-18 MED ORDER — VITAMIN E 45 MG (100 UNIT) PO CAPS
400.0000 [IU] | ORAL_CAPSULE | Freq: Every day | ORAL | Status: DC
  Filled 2021-05-18: qty 4

## 2021-05-18 MED ORDER — SENNOSIDES-DOCUSATE SODIUM 8.6-50 MG PO TABS: 1.0000 | ORAL_TABLET | Freq: Every evening | ORAL | Status: DC | PRN

## 2021-05-18 MED ORDER — NITROGLYCERIN 0.4 MG SL SUBL: 0.4000 mg | SUBLINGUAL_TABLET | SUBLINGUAL | Status: DC | PRN

## 2021-05-18 MED ORDER — ASPIRIN EC 81 MG PO TBEC
81.0000 mg | DELAYED_RELEASE_TABLET | Freq: Every day | ORAL | Status: DC
  Administered 2021-05-19: 81 mg via ORAL
  Filled 2021-05-18 (×2): qty 1

## 2021-05-18 MED ORDER — TICAGRELOR 90 MG PO TABS
90.0000 mg | ORAL_TABLET | Freq: Two times a day (BID) | ORAL | Status: DC
  Administered 2021-05-19: 90 mg via ORAL
  Filled 2021-05-18 (×2): qty 1

## 2021-05-18 MED ORDER — SODIUM CHLORIDE 0.9% FLUSH
3.0000 mL | Freq: Once | INTRAVENOUS | Status: AC
  Administered 2021-05-18: 3 mL via INTRAVENOUS

## 2021-05-18 MED ORDER — ACETAMINOPHEN 650 MG RE SUPP: 650.0000 mg | RECTAL | Status: DC | PRN

## 2021-05-18 MED ORDER — SODIUM CHLORIDE 0.9 % IV SOLN: INTRAVENOUS | Status: DC

## 2021-05-18 MED ORDER — LATANOPROST 0.005 % OP SOLN
1.0000 [drp] | Freq: Every day | OPHTHALMIC | Status: DC
  Filled 2021-05-18: qty 2.5

## 2021-05-18 NOTE — H&P (Addendum)
HISTORY AND PHYSICAL       PATIENT DETAILS Name: Philip Parsons Age: 80 y.o. Sex: male Date of Birth: 1941/07/04 Admit Date: 05/18/2021 CVE:LFYBOFB, Molly Maduro, MD   Patient coming from: Home   CHIEF COMPLAINT:  Left leg weakness  HPI: Philip Parsons is a 80 y.o. male with medical history significant of CVA May 2022, STEMI July 2022-presenting with left leg weakness.  Per patient-he woke up this morning with left leg weakness-and he noted that he was dragging his left leg.  He noted that it was particularly worse when he was climbing stairs.  Patient was last seen normal yesterday evening around 2330 hours.  Per daughter at bedside-he also has some mild dysarthria-and also has developed more worsening of his left leg weakness as the day has progressed.  He was evaluated in the ED by neurology-thought to be out of the window for thrombolytic therapy-[facility] service was then asked to admit this patient for further evaluation and treatment.   ED Course: Underwent CT head which did not show any acute intracranial hemorrhage or infarct, CT angio of the head and neck showed focal to moderate stenosis of right P3/P4, focal/critical stenosis of distal left P2 branch.  Patient was evaluated by neurology-and deemed to be out of the window for thrombolytics-hospitalist service was consulted.  REVIEW OF SYSTEMS:  Constitutional:   No  weight loss, night sweats,  Fevers, chills, fatigue.  HEENT:    No headaches, Dysphagia,Tooth/dental problems,Sore throat,  No sneezing, itching, ear ache, nasal congestion, post nasal drip  Cardio-vascular: No chest pain,Orthopnea, PND,lower extremity edema, anasarca, palpitations  GI:  No heartburn, indigestion, abdominal pain, nausea, vomiting, diarrhea, melena or hematochezia  Resp: No shortness of breath, cough, hemoptysis,plueritic chest pain.   Skin:  No rash or lesions.  GU:  No dysuria, change in color of urine, no urgency or  frequency.  No flank pain.  Musculoskeletal: No joint pain or swelling.  No decreased range of motion.  No back pain.  Endocrine: No heat intolerance, no cold intolerance, no polyuria, no polydipsia  Psych: No change in mood or affect. No depression or anxiety.  No memory loss.   ALLERGIES:   Allergies  Allergen Reactions   Meloxicam Other (See Comments)    Feels bad (reported by [facility] 2021)    PAST MEDICAL HISTORY: Past Medical History:  Diagnosis Date   Coronary artery disease    History of intravascular stent placement    No pertinent past medical history     PAST SURGICAL HISTORY: Past Surgical History:  Procedure Laterality Date   CORONARY/GRAFT ACUTE MI REVASCULARIZATION N/A 03/25/2021   Procedure: Coronary/Graft Acute MI Revascularization;  Surgeon: Marykay Lex, MD;  Location: [facility] INVASIVE CV LAB;  Service: Cardiovascular;  Laterality: N/A;   LEFT HEART CATH N/A 02/02/2012   Procedure: LEFT HEART CATH;  Surgeon: Robynn Pane, MD;  Location: [facility] CATH LAB;  Service: Cardiovascular;  Laterality: N/A;   LEFT HEART CATH AND CORONARY ANGIOGRAPHY N/A 03/25/2021   Procedure: LEFT HEART CATH AND CORONARY ANGIOGRAPHY;  Surgeon: Marykay Lex, MD;  Location: [facility] INVASIVE CV LAB;  Service: Cardiovascular;  Laterality: N/A;   LEFT HEART CATHETERIZATION WITH CORONARY ANGIOGRAM N/A 05/10/2013   Procedure: LEFT HEART CATHETERIZATION WITH CORONARY ANGIOGRAM;  Surgeon: Lesleigh Noe, MD;  Location: [facility] CATH LAB;  Service: Cardiovascular;  Laterality: N/A;   NO PAST SURGERIES      MEDICATIONS AT HOME:  Prior to Admission medications   Medication Sig Start Date End Date Taking? Authorizing Provider  Ascorbic Acid (VITAMIN C PO) Take 500 mg by mouth daily.   Yes [provider]  aspirin EC 81 MG tablet Take 1 tablet (81 mg total) by mouth daily. Swallow whole. 02/17/21 02/17/22 Yes Pokhrel, Laxman, MD  atorvastatin (LIPITOR) 40 MG tablet Take 1 tablet (40 mg  total) by mouth daily. 04/22/21 04/22/22 Yes Lyn Records, MD  Cholecalciferol (VITAMIN D3) 50 MCG (2000 UT) CAPS Take 2,000 Units by mouth daily.   Yes [provider]  Coenzyme Q10 (COQ10) 100 MG CAPS Take 100 mg by mouth daily.   Yes [provider]  latanoprost (XALATAN) 0.005 % ophthalmic solution Place 1 drop into the left eye at bedtime. 01/20/21  Yes [provider]  MAGNESIUM PO Take 250 mg by mouth daily.   Yes [provider]  nitroGLYCERIN (NITROSTAT) 0.4 MG SL tablet Place 1 tablet (0.4 mg total) under the tongue every 5 (five) minutes as needed for chest pain. 03/27/21  Yes Cyndi Bender, NP  ticagrelor (BRILINTA) 90 MG TABS tablet Take 1 tablet (90 mg total) by mouth 2 (two) times daily. 04/21/21  Yes Lyn Records, MD  vitamin E 180 MG (400 UNITS) capsule Take 400 Units by mouth daily.   Yes [provider]    FAMILY HISTORY: Family History  Problem Relation Age of Onset   Arthritis Mother    Healthy Father    Heart disease Sister      SOCIAL HISTORY:  reports that he has quit smoking. He has never used smokeless tobacco. He reports that he does not drink alcohol and does not use drugs.  PHYSICAL EXAM: Blood pressure (!) 133/56, pulse (!) 59, temperature 97.9 F (36.6 C), temperature source Oral, resp. rate 12, height 5\' 9"  (1.753 m), SpO2 99 %.  General appearance :Awake, alert, not in any distress.  Eyes:, pupils equally reactive to light and accomodation,no scleral icterus.Pink conjunctiva HEENT: Atraumatic and Normocephalic Neck: supple, no JVD.  Resp:Good air entry bilaterally, no added sounds  CVS: S1 S2 regular, no murmurs.  GI: Bowel sounds present, Non tender and not distended with no gaurding, rigidity or rebound. Extremities: B/L Lower Ext shows no edema, both legs are warm to touch Neurology: Speech slow but clear-left-sided weakness approximately 4/5-left leg appears more weaker than the left arm. Psychiatric:  Normal judgment and insight. Alert and oriented x 3. Normal mood. Musculoskeletal:gait appears to be normal.No digital cyanosis Skin:No Rash, warm and dry Wounds:N/A  LABS ON ADMISSION:  I have personally reviewed following labs and imaging studies  CBC: Recent Labs  Lab 05/14/21 1243 05/16/21 2015 05/18/21 1514 05/18/21 1521  WBC 7.9 7.6 9.2  --   NEUTROABS  --   --  5.6  --   HGB 12.2* 11.5* 11.7* 11.6*  HCT 37.0* 35.4* 36.7* 34.0*  MCV 91 94.4 94.6  --   PLT 333 291 288  --     Basic Metabolic Panel: Recent Labs  Lab 05/18/21 1514 05/18/21 1521  NA 139 141  K 3.5 3.5  CL 104 105  CO2 28  --   GLUCOSE 92 92  BUN 21 22  CREATININE 1.44* 1.40*  CALCIUM 8.5*  --     GFR: Estimated Creatinine Clearance: 40.3 mL/min (A) (by C-G formula based on SCr of 1.4 mg/dL (H)).  Liver Function Tests: Recent Labs  Lab 05/18/21 1514  AST 23  ALT 18  ALKPHOS 61  BILITOT 0.5  PROT 6.5  ALBUMIN 3.0*   No results for input(s): LIPASE, AMYLASE in the last 168 hours. No results for input(s): AMMONIA in the last 168 hours.  Coagulation Profile: Recent Labs  Lab 05/18/21 1514  INR 1.0    Cardiac Enzymes: No results for input(s): CKTOTAL, CKMB, CKMBINDEX, TROPONINI in the last 168 hours.  BNP (last 3 results) No results for input(s): PROBNP in the last 8760 hours.  HbA1C: No results for input(s): HGBA1C in the last 72 hours.  CBG: Recent Labs  Lab 05/18/21 1512  GLUCAP 93    Lipid Profile: No results for input(s): CHOL, HDL, LDLCALC, TRIG, CHOLHDL, LDLDIRECT in the last 72 hours.  Thyroid Function Tests: No results for input(s): TSH, T4TOTAL, FREET4, T3FREE, THYROIDAB in the last 72 hours.  Anemia Panel: No results for input(s): VITAMINB12, FOLATE, FERRITIN, TIBC, IRON, RETICCTPCT in the last 72 hours.  Urine analysis: No results found for: COLORURINE, APPEARANCEUR, LABSPEC, PHURINE, GLUCOSEU, HGBUR, BILIRUBINUR, KETONESUR, PROTEINUR, UROBILINOGEN,  NITRITE, LEUKOCYTESUR  Sepsis Labs: Lactic Acid, Venous No results found for: LATICACIDVEN   Microbiology: No results found for this or any previous visit (from the past 240 hour(s)).    RADIOLOGIC STUDIES ON ADMISSION: CT HEAD CODE STROKE WO CONTRAST  Result Date: 05/18/2021 CLINICAL DATA:  Code stroke.  Left-sided weakness and slurred speech EXAM: CT HEAD WITHOUT CONTRAST TECHNIQUE: Contiguous axial images were obtained from the base of the skull through the vertex without intravenous contrast. COMPARISON:  CT head 02/15/2021, brain MRI 02/15/2021 FINDINGS: Brain: There is no evidence of acute intracranial hemorrhage, extra-axial fluid collection, or infarct. The ventricles are stable in size. There is hippocampal atrophy out of proportion to degree of parenchymal atrophy, unchanged no mass lesion is identified. There is no midline shift. Vascular: There is calcification of the bilateral cavernous ICAs. Skull: Normal. Negative for fracture or focal lesion. Sinuses/Orbits: The imaged paranasal sinuses are clear. The mastoid air cells are clear. Bilateral lens implants are in place. The globes and orbits are otherwise unremarkable. Other: None. ASPECTS [facility] Stroke Program Early CT Score) - Ganglionic level infarction (caudate, lentiform nuclei, internal capsule, insula, M1-M3 cortex): 7 - Supraganglionic infarction (M4-M6 cortex): 3 Total score (0-10 with 10 being normal): 10 IMPRESSION: 1. No acute intracranial hemorrhage or infarct. 2. ASPECTS is 10 3. Hippocampal atrophy out of proportion to the degree of parenchymal atrophy, unchanged. These results were called by telephone at the time of interpretation on 05/18/2021 at 3:36 pm to provider [facility] , who verbally acknowledged these results. Electronically Signed   By: Lesia Hausen M.D.   On: 05/18/2021 15:40   CT ANGIO HEAD NECK W WO CM (CODE STROKE)  Result Date: 05/18/2021 CLINICAL DATA:  Neuro deficit, acute stroke suspected EXAM:  CT ANGIOGRAPHY HEAD AND NECK TECHNIQUE: Multidetector CT imaging of the head and neck was performed using the standard protocol during bolus administration of intravenous contrast. Multiplanar CT image reconstructions and MIPs were obtained to evaluate the vascular anatomy. Carotid stenosis measurements (when applicable) are obtained utilizing NASCET criteria, using the distal internal carotid diameter as the denominator. CONTRAST:  82mL OMNIPAQUE IOHEXOL 350 MG/ML SOLN COMPARISON:  Same day noncontrast CT head, MRA head/neck 02/16/2021, CT head 02/15/2021 FINDINGS: CTA NECK FINDINGS Aortic arch: There is minimal calcification of the aortic arch. The aortic arch is otherwise unremarkable. Right carotid system: There is mild calcified atherosclerotic plaque of the right carotid bulb without hemodynamically significant stenosis or occlusion. There is no  dissection or aneurysm. Left carotid system: There is mild calcified atherosclerotic plaque of the left carotid bulb without hemodynamically significant stenosis or occlusion. There is no dissection or aneurysm. Vertebral arteries: Codominant. No evidence of dissection, stenosis (50% or greater) or occlusion. Skeleton: There is mild multilevel degenerative change of the cervical spine. There is no acute osseous abnormality or aggressive osseous lesion. Other neck: Bilateral palatine tonsilliths are noted. The soft tissues are otherwise unremarkable. Upper chest: The lung apices are clear. Review of the MIP images confirms the above findings CTA HEAD FINDINGS Anterior circulation: There is calcified atherosclerotic plaque of the bilateral intracranial internal carotid arteries without hemodynamically significant stenosis or occlusion. The bilateral ACAs and MCAs are patent. Posterior circulation: There is loss of enhancement of a distal left P2 branch with distal reconstitution, likely not significantly changed compared to the prior MRA allowing for difference in  modality. Mild multifocal irregularity of the distal left PCA branches without high-grade stenosis or occlusion likely reflects atherosclerotic plaque. There is focal moderate to severe stenosis of a right P3/P4 branch with distal reconstitution (8-276, 9-131), which appears slightly worse compared to the prior MRA. The bilateral V4 segments and basilar artery are patent. Venous sinuses: As permitted by contrast timing, patent. Anatomic variants: Posterior communicating arteries are not definitely visualized. Review of the MIP images confirms the above findings IMPRESSION: 1. Focal moderate to severe stenosis of a right P3/P4 branch appears slightly worse compared to the prior MRA, allowing for difference in modality. 2. Focal critical stenosis/short-segment occlusion of the distal left P2 branch with distal reconstitution is similar to the prior MRA, allowing for difference in modality. 3. Otherwise, patent vasculature of the head and neck. These results were called by telephone at the time of interpretation on 05/18/2021 at 4:08 pm to provider [facility] , who verbally acknowledged these results. Electronically Signed   By: Lesia Hausen M.D.   On: 05/18/2021 16:10      EKG:  Personally reviewed.  Normal sinus rhythm  ASSESSMENT AND PLAN: Acute CVA: Has some mild left-sided deficits-he already is on dual antiplatelets with Brilinta/aspirin and high intensity statin.  Discussed with neurologist-Dr. Kelli Hope to continue for now.  Will be admitted and undergo work-up-MRI brain pending.  Continue telemetry monitoring to rule out A. fib.  Await further recommendations from stroke MD.  Await SLP/rehab services eval.  History of CAD-history of PCI to LAD 2013-subsequently had a STEMI on 03/25/2021-underwent emergent LHC-showed 100% thrombotic occlusion of LAD stent-underwent balloon angioplasty: Continue aspirin/Brilinta/high intensity statin.  History of prediabetes: Monitor CBGs-recheck A1c  HLD: On  high intensity statin-repeating LDL in am  Note-COVID PCR pending.  He has no symptoms-however he is not vaccinated  Further plan will depend as patient's clinical course evolves and further radiologic and laboratory data become available. Patient will be monitored closely.  Above noted plan was discussed with patient/f daughter face to face at bedside, they were in agreement.   CONSULTS: Neurology   DVT Prophylaxis: Prophylactic Lovenox   Code Status: Full Code  Disposition Plan: Probably back home with home health services when stroke work-up complete.  Admission status:Inpatient  going to tele   The medical decision making on this patient was of high complexity and the patient is at high risk for clinical deterioration, therefore this is a level 3 visit.    Total time spent  55 minutes.Greater than 50% of this time was spent in counseling, explanation of diagnosis, planning of further management, and coordination of care.  Severity of illness: The appropriate patient status for this patient is OBSERVATION. Observation status is judged to be reasonable and necessary in order to provide the required intensity of service to ensure the patient's safety. The patient's presenting symptoms, physical exam findings, and initial radiographic and laboratory data in the context of their medical condition is felt to place them at decreased risk for further clinical deterioration. Furthermore, it is anticipated that the patient will be medically stable for discharge from the hospital within 2 midnights of admission. The following factors support the patient status of observation.   " The patient's presenting symptoms include slurred speech/left leg weakness " The physical exam findings include mild left-sided hemiplegia " The initial radiographic and laboratory data are concerning for acute CVA   Philip Parsons [facility] Pager [phone]  If 7PM-7AM, please contact  night-coverage  Please page via [email or web address]  Go to [email or web address] and use [facility] universal password to access. If you do not have the password, please contact the hospital operator.  Locate the [facility] provider you are looking for under [facility] and page to a number that you can be directly reached. If you still have difficulty reaching the provider, please page the [facility] (Director on Call) for the Hospitalists listed on amion for assistance.  05/18/2021, 5:46 PM

## 2021-05-18 NOTE — ED Notes (Signed)
Pt ambulated to the restroom with steady gait. 

## 2021-05-18 NOTE — Care Management CC44 (Signed)
Condition Code 44 Documentation Completed  Patient Details  Name: Philip Parsons MRN: [id number] Date of Birth: [birth year]   Condition Code 44 given:  Yes Patient signature on Condition Code 44 notice:  Yes Documentation of 2 MD's agreement:  Yes Code 44 added to claim:  Yes    Michel Bickers, RN 05/18/2021, 7:44 PM

## 2021-05-18 NOTE — Consult Note (Signed)
Neurology Consultation  Reason for Consult: Left-sided weakness, abnormal gait, slurred speech Referring Physician: Arthor Captain, PA  CC: Left-sided weakness, gait disturbance  History is obtained from: Patient, Patient's daughter via telephone, chart review  HPI: Philip Parsons is a 80 y.o. male with a medical history significant for coronary artery disease s/p LAD stent in 2013 and 100% stent occlusion with STEMI in June of 2022 requiring balloon angioplasty, hyperlipidemia, remote right thalamic lacunar stroke in May of 2022, remote smoking (2.5 pack years while in middle school/high school), borderline hypertension, and CRVO who presented to the ED 05/18/2021 for evaluation of left-sided weakness, gait instability 2/2 dragging his left foot, and slurred speech. Patient was last seen normal last night before he went to bed at around 11:30PM and when he woke this morning around 10:00AM he was dragging his left foot. His daughter also noted that his speech was slurred. At around 11:00AM she states that these symptoms progressed and became much worse than when he woke up. EMS was activated noting left-sided weakness and a dragging left foot with ambulation and brought the patient in as a Code Stroke for further neurology evaluation.   At baseline, Mr. Gamel lives at home with his wife and lives independently. He is able to ambulate without assistive device, manages his own medications, and is still able to drive. He does not require assistance to complete his ADLs. He does have some patchy vision in his left eye with poor vision in the left compared to the right eye from his CRVO.  LKW: 05/17/2021 23:30 tpa given?: no, outside of time window for thrombolytic therapy IR Thrombectomy? No, presentation is not consistent with LVO. Imaging reviewed by attending neurologist in conjunction with radiology without evidence of LVO on vessel imaging.  Modified Rankin Scale: 0-Completely asymptomatic and back to  baseline post- stroke  ROS: A complete ROS was performed and is negative except as noted in the HPI.   Past Medical History:  Diagnosis Date   Coronary artery disease    History of intravascular stent placement    No pertinent past medical history    Past Surgical History:  Procedure Laterality Date   CORONARY/GRAFT ACUTE MI REVASCULARIZATION N/A 03/25/2021   Procedure: Coronary/Graft Acute MI Revascularization;  Surgeon: Marykay Lex, MD;  Location: [facility] INVASIVE CV LAB;  Service: Cardiovascular;  Laterality: N/A;   LEFT HEART CATH N/A 02/02/2012   Procedure: LEFT HEART CATH;  Surgeon: Robynn Pane, MD;  Location: [facility] CATH LAB;  Service: Cardiovascular;  Laterality: N/A;   LEFT HEART CATH AND CORONARY ANGIOGRAPHY N/A 03/25/2021   Procedure: LEFT HEART CATH AND CORONARY ANGIOGRAPHY;  Surgeon: Marykay Lex, MD;  Location: [facility] INVASIVE CV LAB;  Service: Cardiovascular;  Laterality: N/A;   LEFT HEART CATHETERIZATION WITH CORONARY ANGIOGRAM N/A 05/10/2013   Procedure: LEFT HEART CATHETERIZATION WITH CORONARY ANGIOGRAM;  Surgeon: Lesleigh Noe, MD;  Location: [facility] CATH LAB;  Service: Cardiovascular;  Laterality: N/A;   NO PAST SURGERIES     Family History  Problem Relation Age of Onset   Arthritis Mother    Healthy Father    Heart disease Sister    Social History:   reports that he has quit smoking. He has never used smokeless tobacco. He reports that he does not drink alcohol and does not use drugs.  Medications No current facility-administered medications for this encounter.  Current Outpatient Medications:    Ascorbic Acid (VITAMIN C PO), Take 500 mg by mouth daily.,  Disp: , Rfl:    aspirin EC 81 MG tablet, Take 1 tablet (81 mg total) by mouth daily. Swallow whole., Disp: 90 tablet, Rfl: 3   atorvastatin (LIPITOR) 40 MG tablet, Take 1 tablet (40 mg total) by mouth daily., Disp: 90 tablet, Rfl: 3   Cholecalciferol (VITAMIN D3) 50 MCG (2000 UT) CAPS, Take 2,000 Units by mouth  daily., Disp: , Rfl:    Coenzyme Q10 (COQ10) 100 MG CAPS, Take 100 mg by mouth daily., Disp: , Rfl:    latanoprost (XALATAN) 0.005 % ophthalmic solution, Place 1 drop into the left eye at bedtime., Disp: , Rfl:    MAGNESIUM PO, Take 250 mg by mouth daily., Disp: , Rfl:    nitroGLYCERIN (NITROSTAT) 0.4 MG SL tablet, Place 1 tablet (0.4 mg total) under the tongue every 5 (five) minutes as needed for chest pain., Disp: 25 tablet, Rfl: 2   ticagrelor (BRILINTA) 90 MG TABS tablet, Take 1 tablet (90 mg total) by mouth 2 (two) times daily., Disp: 180 tablet, Rfl: 3   vitamin E 180 MG (400 UNITS) capsule, Take 400 Units by mouth daily., Disp: , Rfl:   Exam: Current vital signs: BP (!) 155/72   Pulse 63   Temp 97.9 F (36.6 C) (Oral)   Resp 18   Ht 5\' 9"  (1.753 m)   SpO2 99%   BMI 22.79 kg/m  Vital signs in last 24 hours: Temp:  [97.9 F (36.6 C)] 97.9 F (36.6 C) (08/22 1517) Pulse Rate:  [63] 63 (08/22 1517) Resp:  [18] 18 (08/22 1517) BP: (155)/(72) 155/72 (08/22 1530) SpO2:  [99 %] 99 % (08/22 1517)  GENERAL: Awake, alert, in no acute distress Psych: Affect appropriate for situation, patient is calm and cooperative with examination Head: Normocephalic and atraumatic, without obvious abnormality EENT: Normal conjunctivae, dry mucous membranes, no OP obstruction LUNGS: Normal respiratory effort. Non-labored breathing on room air CV: Regular rate on telemetry, no pedal edema ABDOMEN: Soft, non-tender, non-distended Extremities: warm, well perfused, without obvious deformity  NEURO:  Mental Status: Awake, alert, and oriented to person and month. He incorrectly states that he is 80 years old. When asked the year he initially could not recall and about 2 minutes later was able to correctly state that the year is 2022.  He does have some confusion that is not present at baseline He follows commands without difficulty.  He is able to provide some details regarding his presentation but the  last known well time and symptom onset is varied throughout the assessment and he eventually is able to communicate that he was dragging his left foot when he woke this morning.  Speech/Language: speech is intact without dysarthria.  Naming, repetition, fluency, and comprehension intact without aphasia  No neglect is noted Cranial Nerves:  II: PERRL 3 mm/brisk. Visual fields full.  III, IV, VI: EOMI without ptosis or gaze preference V: Sensation is intact to light touch and symmetrical to face.  VII: Face is symmetric resting and smiling.  VIII: Hearing is intact to voice IX, X: Palate elevation is symmetric. Phonation normal.  XI: Normal sternocleidomastoid and trapezius muscle strength XII: Tongue protrudes midline without fasciculations.   Motor: Right upper and lower extremities 5/5 strength without vertical drift.  Left upper and lower extremity weakness is noted with 4/5 strength and some vertical drift.  Tone is normal. Bulk is normal.  Sensation: Intact to light touch bilaterally in all four extremities. Coordination: FTN and HKS intact without dysmetria out of proportion  to left-sided weakness.  DTRs: 1+ and symmetric patellae and biceps Gait: Deferred  NIHSS: 1a Level of Conscious.: 0 1b LOC Questions: 1 1c LOC Commands: 0 2 Best Gaze: 0 3 Visual: 0 4 Facial Palsy: 0 5a Motor Arm - left: 1 5b Motor Arm - Right: 0 6a Motor Leg - Left: 1 6b Motor Leg - Right: 0 7 Limb Ataxia: 0 8 Sensory: 0 9 Best Language: 0 10 Dysarthria: 0 11 Extinct. and Inatten.: 0 TOTAL: 3  Labs I have reviewed labs in epic and the results pertinent to this consultation are:  CBC    Component Value Date/Time   WBC 9.2 05/18/2021 1514   RBC 3.88 (L) 05/18/2021 1514   HGB 11.6 (L) 05/18/2021 1521   HGB 12.2 (L) 05/14/2021 1243   HCT 34.0 (L) 05/18/2021 1521   HCT 37.0 (L) 05/14/2021 1243   PLT 288 05/18/2021 1514   PLT 333 05/14/2021 1243   MCV 94.6 05/18/2021 1514   MCV 91 05/14/2021  1243   MCH 30.2 05/18/2021 1514   MCHC 31.9 05/18/2021 1514   RDW 13.3 05/18/2021 1514   RDW 13.4 05/14/2021 1243   LYMPHSABS 1.6 05/18/2021 1514   MONOABS 1.1 (H) 05/18/2021 1514   EOSABS 0.8 (H) 05/18/2021 1514   BASOSABS 0.0 05/18/2021 1514   CMP     Component Value Date/Time   NA 141 05/18/2021 1521   K 3.5 05/18/2021 1521   CL 105 05/18/2021 1521   CO2 24 03/26/2021 0030   GLUCOSE 92 05/18/2021 1521   BUN 22 05/18/2021 1521   CREATININE 1.40 (H) 05/18/2021 1521   CALCIUM 8.2 (L) 03/26/2021 0030   PROT 6.2 (L) 03/25/2021 1329   PROT 6.9 06/21/2018 1150   ALBUMIN 3.0 (L) 03/25/2021 1329   ALBUMIN 4.0 06/21/2018 1150   AST 22 03/25/2021 1329   ALT 17 03/25/2021 1329   ALKPHOS 53 03/25/2021 1329   BILITOT 0.8 03/25/2021 1329   BILITOT 0.3 06/21/2018 1150   GFRNONAA 54 (L) 03/26/2021 0030   GFRAA >60 07/13/2016 1653   Lipid Panel     Component Value Date/Time   CHOL 137 03/25/2021 1506   CHOL 235 (H) 07/30/2019 0826   TRIG 31 03/25/2021 1506   HDL 52 03/25/2021 1506   HDL 59 07/30/2019 0826   CHOLHDL 2.6 03/25/2021 1506   VLDL 6 03/25/2021 1506   LDLCALC 79 03/25/2021 1506   LDLCALC 154 (H) 07/30/2019 0826   LDLDIRECT 152.5 10/15/2013 1131   Lab Results  Component Value Date   HGBA1C 6.1 (H) 03/25/2021   Imaging I have reviewed the images obtained:  CT-scan of the brain 05/18/2021: 1. No acute intracranial hemorrhage or infarct. 2. ASPECTS is 10 3. Hippocampal atrophy out of proportion to the degree of parenchymal atrophy, unchanged.  CT angio head and neck WWO contrast 05/18/2021: 1. Focal moderate to severe stenosis of a right P3/P4 branch appears slightly worse compared to the prior MRA, allowing for difference in modality. 2. Focal critical stenosis/short-segment occlusion of the distal left P2 branch with distal reconstitution is similar to the prior MRA, allowing for difference in modality. 3. Otherwise, patent vasculature of the head and neck.  MRI  brain 05/18/2021 Acute to early subacute infarct in the right pons.  Assessment: 80 y.o. male with PMHx of CAD s/p LAD stent in 2013, 100% occlusion of LAD stent with STEMI s/p cardiac catheterization in June 2022, borderline HTN, HLD, lacunar stroke in May of 2022, remote smoking history, and  CRVO who presented to the ED as a Code Stroke via EMS for evaluation of left-sided weakness, gait instability with dragging of left foot, and dysarthria. LKW was at 11:30 PM last night when he went to bed and he woke with left foot dragging at 10:00 this morning.  - Examination reveals patient with left upper and lower extremity weakness, confusion with orientation to self and month but incorrectly stating age and delayed reporting of year. He does not have any noted dysarthria on hospital arrival. Initial NIHSS of 3 on presentation.   - CTH is without acute intracranial hemorrhage or infarct, ASPECTS of 10. Vessel imaging reveals moderate to severe stenosis of right P3/P4 that appears slightly worse compared to the prior MRA imaging and focal critical stenosis/short-segment occlusion of the distal left P2 branch with distal reconstitution similar to prior MRA.  - Stroke risk factors include advanced age, MI, LAD stenosis with MI in 2013 s/p stent, recent occlusion of LAD stent with STEMI in June 2022, hyperlipidemia, CAD, CRVO, and remote smoking history.  - Presentation is most consistent with acute ischemic stroke due to intracranial stenosis.  - Patient not a thrombolytic therapy candidate due to last known well being last night before bed, patient is outside of the thrombolytic therapy window. Patient not a IR candidate due to location of moderate-to-severe stenosis of right P3/P4 branch. Left distal P2 branch critical stenosis/short-segment occlusion would not present with left-sided weakness.   Impression: Left-sided weakness; likely 2/2 acute ischemic stroke  Intracranial stenosis  Recommendations: -  HgbA1c goal < 7%, fasting lipid panel goal LDL < 70; continue statin therapy  - MRI brain without contrast, recommended on initial evaluation and reviewed by attending physician later in the day, revealing acute right pontine infarct - Frequent neuro checks - Echocardiogram - Prophylactic therapy- Antiplatelet med: continue home ASA and Brilinta (also indicated given recent cardiac stent placement), would continue for at least 21 days for indication of acute stroke though likely longer course will be needed for cardiac indication - Allow for permissive hypertension for 24 - 48 hours  - Risk factor modification - Telemetry monitoring - PT consult, OT consult, Speech consult - Stroke team to follow  Lanae Boast, AGAC-NP [facility] Pager: [phone]  Attending Neurologist's note:  I personally saw this patient, gathering history, performing a full neurologic examination, reviewing relevant labs, personally reviewing relevant imaging including head CT, CTA and MRI brain, and formulated the assessment and plan, adding the note above for completeness and clarity to accurately reflect my thoughts

## 2021-05-18 NOTE — ED Triage Notes (Signed)
Pt BIB [facility] from home, daughter reports Left side weakness and slurred speech starting 11am this morning. Pt LKW at 11am.  Pt currently with RR team and neurology, this RN unable to be at bedside at this time.

## 2021-05-18 NOTE — Telephone Encounter (Signed)
Spoke with daughter and she was calling to let us know that she is going to transport pt to the hospital due to changes in symptoms.  Pt noted to have left sided weakness, slurred speech and trouble swallowing.  Advised daughter she needs to call 911 and have pt transported via EMS.  Daughter agreeable and appreciative for call.

## 2021-05-18 NOTE — ED Provider Notes (Signed)
[facility] EMERGENCY DEPARTMENT Provider Note   CSN: [id number] Arrival date & time: 05/18/21  1509  An emergency department physician performed an initial assessment on this suspected stroke patient at 1510.  History Chief Complaint  Patient presents with   Code Stroke    Philip Parsons is a 80 y.o. male who presents as a code stroke.  Patient has a past medical history of previous CVA, ST elevation MI, acute kidney injury, CAD, status post DES, gout, hyperlipidemia. Patient was  LKW 11:00 PM last night. He awoke dragging his left leg. His sx worsened around 11:30 am. Hx of cva 3 months ago +TPA. Patient presents with left sided weakness, speech difficulty and mild confusion.   HPI     Past Medical History:  Diagnosis Date   Coronary artery disease    History of intravascular stent placement    No pertinent past medical history     Patient Active Problem List   Diagnosis Date Noted   Acute ST elevation myocardial infarction (STEMI) due to occlusion of left anterior descending (LAD) coronary artery (HCC) 03/25/2021   Coronary stent thrombosis 03/25/2021   Acute ST elevation myocardial infarction (STEMI) of anterior wall (HCC) 03/25/2021   Acute CVA (cerebrovascular accident) (HCC) 02/15/2021   Gout 02/15/2021   AKI (acute kidney injury) (HCC) 02/15/2021   Hyperlipidemia with target LDL less than 70 - refuses medications 10/17/2013   Coronary stent restenosis due to progression of disease 05/10/2013   Coronary atherosclerosis of native coronary artery 05/10/2013   Myocardial infarction, anterolateral wall, acute (HCC) 02/03/2012    Past Surgical History:  Procedure Laterality Date   CORONARY/GRAFT ACUTE MI REVASCULARIZATION N/A 03/25/2021   Procedure: Coronary/Graft Acute MI Revascularization;  Surgeon: Marykay Lex, MD;  Location: [facility] INVASIVE CV LAB;  Service: Cardiovascular;  Laterality: N/A;   LEFT HEART CATH N/A 02/02/2012   Procedure: LEFT HEART  CATH;  Surgeon: Robynn Pane, MD;  Location: [facility] CATH LAB;  Service: Cardiovascular;  Laterality: N/A;   LEFT HEART CATH AND CORONARY ANGIOGRAPHY N/A 03/25/2021   Procedure: LEFT HEART CATH AND CORONARY ANGIOGRAPHY;  Surgeon: Marykay Lex, MD;  Location: [facility] INVASIVE CV LAB;  Service: Cardiovascular;  Laterality: N/A;   LEFT HEART CATHETERIZATION WITH CORONARY ANGIOGRAM N/A 05/10/2013   Procedure: LEFT HEART CATHETERIZATION WITH CORONARY ANGIOGRAM;  Surgeon: Lesleigh Noe, MD;  Location: [facility] CATH LAB;  Service: Cardiovascular;  Laterality: N/A;   NO PAST SURGERIES         Family History  Problem Relation Age of Onset   Arthritis Mother    Healthy Father    Heart disease Sister     Social History   Tobacco Use   Smoking status: Former   Smokeless tobacco: Never  Substance Use Topics   Alcohol use: No    Alcohol/week: 0.0 standard drinks   Drug use: No    Home Medications Prior to Admission medications   Medication Sig Start Date End Date Taking? Authorizing Provider  Ascorbic Acid (VITAMIN C PO) Take 500 mg by mouth daily.   Yes [provider]  aspirin EC 81 MG tablet Take 1 tablet (81 mg total) by mouth daily. Swallow whole. 02/17/21 02/17/22 Yes Pokhrel, Laxman, MD  atorvastatin (LIPITOR) 40 MG tablet Take 1 tablet (40 mg total) by mouth daily. 04/22/21 04/22/22 Yes Lyn Records, MD  Cholecalciferol (VITAMIN D3) 50 MCG (2000 UT) CAPS Take 2,000 Units by mouth daily.   Yes [provider]  Coenzyme Q10 (COQ10) 100 MG CAPS Take 100 mg by mouth daily.   Yes [provider]  latanoprost (XALATAN) 0.005 % ophthalmic solution Place 1 drop into the left eye at bedtime. 01/20/21  Yes [provider]  MAGNESIUM PO Take 250 mg by mouth daily.   Yes [provider]  nitroGLYCERIN (NITROSTAT) 0.4 MG SL tablet Place 1 tablet (0.4 mg total) under the tongue every 5 (five) minutes as needed for chest pain. 03/27/21  Yes Cyndi Bender, NP   ticagrelor (BRILINTA) 90 MG TABS tablet Take 1 tablet (90 mg total) by mouth 2 (two) times daily. 04/21/21  Yes Lyn Records, MD  vitamin E 180 MG (400 UNITS) capsule Take 400 Units by mouth daily.   Yes [provider]    Allergies    Meloxicam  Review of Systems   Review of Systems  Unable to perform ROS: Mental status change   Physical Exam Updated Vital Signs BP (!) 155/72   Pulse 63   Temp 97.9 F (36.6 C) (Oral)   Resp 18   Ht 5\' 9"  (1.753 m)   SpO2 99%   BMI 22.79 kg/m   Physical Exam Vitals and nursing note reviewed.  Constitutional:      General: He is not in acute distress.    Appearance: He is well-developed. He is not diaphoretic.  HENT:     Head: Normocephalic and atraumatic.  Eyes:     General: No scleral icterus.    Conjunctiva/sclera: Conjunctivae normal.  Cardiovascular:     Rate and Rhythm: Normal rate and regular rhythm.     Heart sounds: Normal heart sounds.  Pulmonary:     Effort: Pulmonary effort is normal. No respiratory distress.     Breath sounds: Normal breath sounds.  Abdominal:     Palpations: Abdomen is soft.     Tenderness: There is no abdominal tenderness.  Musculoskeletal:     Cervical back: Normal range of motion and neck supple.  Skin:    General: Skin is warm and dry.  Neurological:     Mental Status: He is alert. He is disoriented.     GCS: GCS eye subscore is 4. GCS verbal subscore is 5. GCS motor subscore is 6.     Cranial Nerves: No cranial nerve deficit.     Motor: Weakness and pronator drift (Left upper and lower pronator drift) present.  Psychiatric:        Behavior: Behavior normal.    ED Results / Procedures / Treatments   Labs (all labs ordered are listed, but only abnormal results are displayed) Labs Reviewed  CBC - Abnormal; Notable for the following components:      Result Value   RBC 3.88 (*)    Hemoglobin 11.7 (*)    HCT 36.7 (*)    All other components within normal limits  DIFFERENTIAL -  Abnormal; Notable for the following components:   Monocytes Absolute 1.1 (*)    Eosinophils Absolute 0.8 (*)    All other components within normal limits  I-STAT CHEM 8, ED - Abnormal; Notable for the following components:   Creatinine, Ser 1.40 (*)    Calcium, Ion 1.07 (*)    Hemoglobin 11.6 (*)    HCT 34.0 (*)    All other components within normal limits  PROTIME-INR  APTT  COMPREHENSIVE METABOLIC PANEL  CBG MONITORING, ED    EKG None  Radiology CT HEAD CODE STROKE WO CONTRAST  Result Date: 05/18/2021  CLINICAL DATA:  Code stroke.  Left-sided weakness and slurred speech EXAM: CT HEAD WITHOUT CONTRAST TECHNIQUE: Contiguous axial images were obtained from the base of the skull through the vertex without intravenous contrast. COMPARISON:  CT head 02/15/2021, brain MRI 02/15/2021 FINDINGS: Brain: There is no evidence of acute intracranial hemorrhage, extra-axial fluid collection, or infarct. The ventricles are stable in size. There is hippocampal atrophy out of proportion to degree of parenchymal atrophy, unchanged no mass lesion is identified. There is no midline shift. Vascular: There is calcification of the bilateral cavernous ICAs. Skull: Normal. Negative for fracture or focal lesion. Sinuses/Orbits: The imaged paranasal sinuses are clear. The mastoid air cells are clear. Bilateral lens implants are in place. The globes and orbits are otherwise unremarkable. Other: None. ASPECTS [facility] Stroke Program Early CT Score) - Ganglionic level infarction (caudate, lentiform nuclei, internal capsule, insula, M1-M3 cortex): 7 - Supraganglionic infarction (M4-M6 cortex): 3 Total score (0-10 with 10 being normal): 10 IMPRESSION: 1. No acute intracranial hemorrhage or infarct. 2. ASPECTS is 10 3. Hippocampal atrophy out of proportion to the degree of parenchymal atrophy, unchanged. These results were called by telephone at the time of interpretation on 05/18/2021 at 3:36 pm to provider [facility] , who  verbally acknowledged these results. Electronically Signed   By: Lesia Hausen M.D.   On: 05/18/2021 15:40    Procedures Procedures   Medications Ordered in ED Medications  sodium chloride flush (NS) 0.9 % injection 3 mL (3 mLs Intravenous Given 05/18/21 1553)  iohexol (OMNIPAQUE) 350 MG/ML injection 60 mL (60 mLs Intravenous Contrast Given 05/18/21 1543)    ED Course  I have reviewed the triage vital signs and the nursing notes.  Pertinent labs & imaging results that were available during my care of the patient were reviewed by me and considered in my medical decision making (see chart for details).    MDM Rules/Calculators/A&P                         [id number] VS: BP (!) 155/72   Pulse 63   Temp 97.9 F (36.6 C) (Oral)   Resp 18   Ht 5\' 9"  (1.753 m)   SpO2 99%   BMI 22.79 kg/m   is gathered by EMS, Family, EMR. Previous records obtained and reviewed. DDX:The patient's complaint of Weakness involves an extensive number of diagnostic and treatment options, and is a complaint that carries with it a high risk of complications, morbidity, and potential mortality. Given the large differential diagnosis, medical decision making is of high complexity. The differential diagnosis of weakness includes but is not limited to neurologic causes (GBS, myasthenia gravis, CVA, MS, ALS, transverse myelitis, spinal cord injury, CVA, botulism, ) and other causes: ACS, Arrhythmia, syncope, orthostatic hypotension, sepsis, hypoglycemia, electrolyte disturbance, hypothyroidism, respiratory failure, symptomatic anemia, dehydration, heat injury, polypharmacy, malignancy.  Labs: I ordered reviewed and interpreted labs which include CBC with mild anemia, Chem-8 without significant abnormality, PT/INR and APTT within normal limits, CMP with mild renal insufficiency  Imaging: I ordered and reviewed images which included CT/CTA head and neck. I independently visualized and interpreted all imaging.  There are no acute, significant findings on today's images. EKG:NSR at a rate of 64 Consults:Neurology who wants admission for full stroke work up. Patient is out of the window for TPA administration MDM: patient with suspected stroke. Will need admission. Patient disposition:The patient appears reasonably stabilized for admission considering the current resources, flow, and capabilities available  in the ED at this time, and I doubt any other [facility] requiring further screening and/or treatment in the ED prior to admission.       Final Clinical Impression(s) / ED Diagnoses Final diagnoses:  None    Rx / DC Orders ED Discharge Orders     None        Arthor Captain, PA-C 05/18/21 2236    Derwood Kaplan, MD 05/18/21 2356

## 2021-05-18 NOTE — ED Notes (Signed)
Attempted to call MRI back x2. Pt is a/o x4, no gtts and not claustrophobic

## 2021-05-18 NOTE — ED Notes (Signed)
Patient transported to MRI 

## 2021-05-18 NOTE — Telephone Encounter (Signed)
Patient's daughter called in look to have a CT scan and a MRI done for her father after his recently trip to the ER. Please advise

## 2021-05-18 NOTE — ED Notes (Signed)
Unable to do stroke swallow screen and 1730 NIH due to pt being transported down hallway as this RN was arriving to room. Orvis Brill RN made aware

## 2021-05-18 NOTE — Care Management Obs Status (Signed)
MEDICARE OBSERVATION STATUS NOTIFICATION   Patient Details  Name: Philip Parsons MRN: [id number] Date of Birth: 02/18/1941   Medicare Observation Status Notification Given:  Waylan Boga, RN 05/18/2021, 7:44 PM

## 2021-05-18 NOTE — Code Documentation (Signed)
Stroke Response Nurse Documentation Code Documentation  Philip Parsons is a 80 y.o. male arriving to [name] ED via [facility] on 05/18/21 with past medical hx of stroke with tPA 3 months ago, MI 2 months ago, HLD, CAD. Code stroke was activated by EMS. Patient from home where he was [facility] 05/17/21 2330 and now complaining of left sided weakness, difficulty speaking, and some confusion.   On aspirin 81 mg daily. Stroke team at the bedside on patient arrival. Labs drawn and patient cleared for CT by Dr. Jeraldine Loots. Patient to CT with team. NIHSS 3, see documentation for details and code stroke times. Patient with disoriented, left arm weakness, and left leg weakness on exam. The following imaging was completed:  CT, CTA head and neck. Patient is not a candidate for tPA due to out of window.  Care/Plan: q2h vitals and mNIHSS. Permissive hypertension 220/120. Bedside handoff with ED RN Philip Parsons.    Philip Parsons  Stroke Response RN

## 2021-05-18 NOTE — ED Notes (Signed)
Admitting at bedside 

## 2021-05-19 ENCOUNTER — Encounter (HOSPITAL_COMMUNITY): Payer: Self-pay | Admitting: Internal Medicine

## 2021-05-19 ENCOUNTER — Observation Stay (HOSPITAL_BASED_OUTPATIENT_CLINIC_OR_DEPARTMENT_OTHER): Payer: Medicare Other

## 2021-05-19 DIAGNOSIS — Z8673 Personal history of transient ischemic attack (TIA), and cerebral infarction without residual deficits: Secondary | ICD-10-CM

## 2021-05-19 DIAGNOSIS — I6389 Other cerebral infarction: Secondary | ICD-10-CM | POA: Diagnosis not present

## 2021-05-19 DIAGNOSIS — I6302 Cerebral infarction due to thrombosis of basilar artery: Secondary | ICD-10-CM | POA: Diagnosis not present

## 2021-05-19 DIAGNOSIS — I639 Cerebral infarction, unspecified: Secondary | ICD-10-CM | POA: Diagnosis not present

## 2021-05-19 DIAGNOSIS — E785 Hyperlipidemia, unspecified: Secondary | ICD-10-CM | POA: Diagnosis not present

## 2021-05-19 DIAGNOSIS — I25118 Atherosclerotic heart disease of native coronary artery with other forms of angina pectoris: Secondary | ICD-10-CM | POA: Diagnosis not present

## 2021-05-19 LAB — SARS CORONAVIRUS 2 (TAT 6-24 HRS): SARS Coronavirus 2: NEGATIVE

## 2021-05-19 LAB — LIPID PANEL
Cholesterol: 131 mg/dL (ref 0–200)
HDL: 49 mg/dL (ref >40)
LDL Cholesterol: 65 mg/dL (ref 0–99)
Total CHOL/HDL Ratio: 2.7 RATIO
Triglycerides: 83 mg/dL (ref <150)
VLDL: 17 mg/dL (ref 0–40)

## 2021-05-19 LAB — ECHOCARDIOGRAM COMPLETE
AR max vel: 2.87 cm2
AV Area VTI: 2.5 cm2
AV Area mean vel: 2.59 cm2
AV Mean grad: 4 mmHg
AV Peak grad: 8 mmHg
Ao pk vel: 1.41 m/s
Area-P 1/2: 2.95 cm2
Height: 69 in
S' Lateral: 2.3 cm
Weight: 2384.5 oz

## 2021-05-19 LAB — HEMOGLOBIN A1C
Hgb A1c MFr Bld: 6.1 % — ABNORMAL HIGH (ref 4.8–5.6)
Mean Plasma Glucose: 128.37 mg/dL

## 2021-05-19 MED ORDER — ACETAMINOPHEN 500 MG PO TABS: 500.0000 mg | ORAL_TABLET | ORAL | Status: AC | PRN

## 2021-05-19 MED ORDER — VITAMIN E 45 MG (100 UNIT) PO CAPS
400.0000 [IU] | ORAL_CAPSULE | Freq: Every day | ORAL | Status: DC
  Filled 2021-05-19: qty 4

## 2021-05-19 NOTE — TOC Transition Note (Signed)
Transition of Care [facility]) - CM/SW Discharge Note   Patient Details  Name: Philip Parsons MRN: [id number] Date of Birth: 17-Mar-1941  [facility] of Care [facility]) CM/SW Contact:  Pollie Friar, RN Phone Number: 05/19/2021, 3:38 PM   Clinical Narrative:    Patient is discharging home with outpatient therapy. CM met with the patient and his daughter and they are in agreement with attending  Neurorehab. Orders in Epic and information on the AVS. Pt has transportation and denies any issues with his home medications. Daughter to provide supervision at home and transportation to home.   Final next level of care: OP Rehab Barriers to Discharge: No Barriers Identified   Patient Goals and CMS Choice     Choice offered to / list presented to : Patient, Adult Children  Discharge Placement                       Discharge Plan and Services                                     Social Determinants of Health (SDOH) Interventions     Readmission Risk Interventions No flowsheet data found.

## 2021-05-19 NOTE — Progress Notes (Signed)
  Echocardiogram 2D Echocardiogram has been performed.  Philip Parsons F 05/19/2021, 10:39 AM

## 2021-05-19 NOTE — Progress Notes (Signed)
STROKE TEAM PROGRESS NOTE   INTERVAL HISTORY His daughter is at the bedside, another daughter is on the phone.  Patient sitting in chair, awake alert, orientated, slight left upper extremity drift.  Otherwise neuro intact.  Complaining of left eye pain for the last 3 days.  Patient has chronic left eye legally blind with history of ? CRVO.    OBJECTIVE Vitals:   05/19/21 0145 05/19/21 0201 05/19/21 0410 05/19/21 0838  BP: (!) 163/76  (!) 141/69 (!) 159/73  Pulse: (!) 58  (!) 58 (!) 55  Resp: 20  14 14   Temp: 98 F (36.7 C)  97.9 F (36.6 C) 97.8 F (36.6 C)  TempSrc: Oral  Oral Oral  SpO2: 98%  99% 98%  Weight:  67.6 kg    Height:  5\' 9"  (1.753 m)      CBC:  Recent Labs  Lab 05/18/21 1514 05/18/21 1521 05/18/21 1742  WBC 9.2  --  8.8  NEUTROABS 5.6  --   --   HGB 11.7* 11.6* 11.8*  HCT 36.7* 34.0* 36.8*  MCV 94.6  --  93.9  PLT 288  --  269    Basic Metabolic Panel:  Recent Labs  Lab 05/18/21 1514 05/18/21 1521 05/18/21 1742  NA 139 141  --   K 3.5 3.5  --   CL 104 105  --   CO2 28  --   --   GLUCOSE 92 92  --   BUN 21 22  --   CREATININE 1.44* 1.40* 1.29*  CALCIUM 8.5*  --   --     Lipid Panel:     Component Value Date/Time   CHOL 131 05/19/2021 0306   CHOL 235 (H) 07/30/2019 0826   TRIG 83 05/19/2021 0306   HDL 49 05/19/2021 0306   HDL 59 07/30/2019 0826   CHOLHDL 2.7 05/19/2021 0306   VLDL 17 05/19/2021 0306   LDLCALC 65 05/19/2021 0306   LDLCALC 154 (H) 07/30/2019 0826   HgbA1c:  Lab Results  Component Value Date   HGBA1C 6.1 (H) 05/19/2021   Urine Drug Screen: No results found for: LABOPIA, COCAINSCRNUR, LABBENZ, AMPHETMU, THCU, LABBARB  Alcohol Level     Component Value Date/Time   ETH <10 02/15/2021 1719    IMAGING   MR BRAIN WO CONTRAST  Result Date: 05/18/2021 CLINICAL DATA:  Neuro deficit, acute stroke suspected EXAM: MRI HEAD WITHOUT CONTRAST TECHNIQUE: Multiplanar, multiecho pulse sequences of the brain and surrounding  structures were obtained without intravenous contrast. COMPARISON:  Same day CT/CTA head FINDINGS: Brain: There is diffusion restriction with associated T2/FLAIR signal abnormality in the right pons consistent with evolving acute to early subacute infarct. There is no evidence of acute intracranial hemorrhage. Additional foci of FLAIR signal abnormality in the subcortical and periventricular white matter likely reflects sequela of chronic white matter microangiopathy. There is mild global parenchymal volume loss with disproportionate hippocampal atrophy, unchanged. There is no midline shift. Vascular: Normal flow voids. Skull and upper cervical spine: Normal marrow signal. Sinuses/Orbits: The paranasal sinuses are clear. Bilateral lens implants are in place. The orbits and globes are otherwise unremarkable. Other: None. IMPRESSION: Acute to early subacute infarct in the right pons. These results were called by telephone at the time of interpretation on 05/18/2021 at 6:43 pm to provider [facility] , who verbally acknowledged these results. Electronically Signed   By: 05/20/2021 M.D.   On: 05/18/2021 18:43   CT HEAD CODE STROKE WO CONTRAST  Result  Date: 05/18/2021 CLINICAL DATA:  Code stroke.  Left-sided weakness and slurred speech EXAM: CT HEAD WITHOUT CONTRAST TECHNIQUE: Contiguous axial images were obtained from the base of the skull through the vertex without intravenous contrast. COMPARISON:  CT head 02/15/2021, brain MRI 02/15/2021 FINDINGS: Brain: There is no evidence of acute intracranial hemorrhage, extra-axial fluid collection, or infarct. The ventricles are stable in size. There is hippocampal atrophy out of proportion to degree of parenchymal atrophy, unchanged no mass lesion is identified. There is no midline shift. Vascular: There is calcification of the bilateral cavernous ICAs. Skull: Normal. Negative for fracture or focal lesion. Sinuses/Orbits: The imaged paranasal sinuses are clear. The  mastoid air cells are clear. Bilateral lens implants are in place. The globes and orbits are otherwise unremarkable. Other: None. ASPECTS [facility] Stroke Program Early CT Score) - Ganglionic level infarction (caudate, lentiform nuclei, internal capsule, insula, M1-M3 cortex): 7 - Supraganglionic infarction (M4-M6 cortex): 3 Total score (0-10 with 10 being normal): 10 IMPRESSION: 1. No acute intracranial hemorrhage or infarct. 2. ASPECTS is 10 3. Hippocampal atrophy out of proportion to the degree of parenchymal atrophy, unchanged. These results were called by telephone at the time of interpretation on 05/18/2021 at 3:36 pm to provider [facility] , who verbally acknowledged these results. Electronically Signed   By: Lesia Hausen M.D.   On: 05/18/2021 15:40   CT ANGIO HEAD NECK W WO CM (CODE STROKE)  Result Date: 05/18/2021 CLINICAL DATA:  Neuro deficit, acute stroke suspected EXAM: CT ANGIOGRAPHY HEAD AND NECK TECHNIQUE: Multidetector CT imaging of the head and neck was performed using the standard protocol during bolus administration of intravenous contrast. Multiplanar CT image reconstructions and MIPs were obtained to evaluate the vascular anatomy. Carotid stenosis measurements (when applicable) are obtained utilizing NASCET criteria, using the distal internal carotid diameter as the denominator. CONTRAST:  55mL OMNIPAQUE IOHEXOL 350 MG/ML SOLN COMPARISON:  Same day noncontrast CT head, MRA head/neck 02/16/2021, CT head 02/15/2021 FINDINGS: CTA NECK FINDINGS Aortic arch: There is minimal calcification of the aortic arch. The aortic arch is otherwise unremarkable. Right carotid system: There is mild calcified atherosclerotic plaque of the right carotid bulb without hemodynamically significant stenosis or occlusion. There is no dissection or aneurysm. Left carotid system: There is mild calcified atherosclerotic plaque of the left carotid bulb without hemodynamically significant stenosis or occlusion. There is  no dissection or aneurysm. Vertebral arteries: Codominant. No evidence of dissection, stenosis (50% or greater) or occlusion. Skeleton: There is mild multilevel degenerative change of the cervical spine. There is no acute osseous abnormality or aggressive osseous lesion. Other neck: Bilateral palatine tonsilliths are noted. The soft tissues are otherwise unremarkable. Upper chest: The lung apices are clear. Review of the MIP images confirms the above findings CTA HEAD FINDINGS Anterior circulation: There is calcified atherosclerotic plaque of the bilateral intracranial internal carotid arteries without hemodynamically significant stenosis or occlusion. The bilateral ACAs and MCAs are patent. Posterior circulation: There is loss of enhancement of a distal left P2 branch with distal reconstitution, likely not significantly changed compared to the prior MRA allowing for difference in modality. Mild multifocal irregularity of the distal left PCA branches without high-grade stenosis or occlusion likely reflects atherosclerotic plaque. There is focal moderate to severe stenosis of a right P3/P4 branch with distal reconstitution (8-276, 9-131), which appears slightly worse compared to the prior MRA. The bilateral V4 segments and basilar artery are patent. Venous sinuses: As permitted by contrast timing, patent. Anatomic variants: Posterior communicating arteries are not  definitely visualized. Review of the MIP images confirms the above findings IMPRESSION: 1. Focal moderate to severe stenosis of a right P3/P4 branch appears slightly worse compared to the prior MRA, allowing for difference in modality. 2. Focal critical stenosis/short-segment occlusion of the distal left P2 branch with distal reconstitution is similar to the prior MRA, allowing for difference in modality. 3. Otherwise, patent vasculature of the head and neck. These results were called by telephone at the time of interpretation on 05/18/2021 at 4:08 pm to  provider [facility] , who verbally acknowledged these results. Electronically Signed   By: Lesia Hausen M.D.   On: 05/18/2021 16:10     ECG - SR rate 61 BPM. (See cardiology reading for complete details)  PHYSICAL EXAM  Temp:  [97.6 F (36.4 C)-98 F (36.7 C)] 97.6 F (36.4 C) (08/23 1203) Pulse Rate:  [55-62] 62 (08/23 1203) Resp:  [14-20] 15 (08/23 1203) BP: (141-163)/(69-76) 153/70 (08/23 1203) SpO2:  [96 %-100 %] 100 % (08/23 1203) Weight:  [67.6 kg] 67.6 kg (08/23 0201)  General - Well nourished, well developed, in no apparent distress.  Ophthalmologic - fundi not visualized due to noncooperation.  Cardiovascular - Regular rhythm and rate.  Mental Status -  Level of arousal and orientation to time, place, and person were intact. Language including expression, naming, repetition, comprehension was assessed and found intact. Attention span and concentration were normal. Fund of Knowledge was assessed and was intact.  Cranial Nerves II - XII - II - Visual field intact OD.  Left eye chronically blind, barely see HW. III, IV, VI - Extraocular movements intact. V - Facial sensation intact bilaterally. VII - Facial movement intact bilaterally. VIII - Hearing & vestibular intact bilaterally. X - Palate elevates symmetrically. XI - Chin turning & shoulder shrug intact bilaterally. XII - Tongue protrusion intact.  Motor Strength - The patient's strength was normal in all extremities except slight pronator drift on the left.  Bulk was normal and fasciculations were absent.   Motor Tone - Muscle tone was assessed at the neck and appendages and was normal.  Reflexes - The patient's reflexes were symmetrical in all extremities and he had no pathological reflexes.  Sensory - Light touch, temperature/pinprick were assessed and were symmetrical.    Coordination - The patient had normal movements in the hands with no ataxia or dysmetria.  Tremor was absent.  Gait and Station -  deferred.   ASSESSMENT/PLAN Mr. Philip Parsons is a 80 y.o. male with history of coronary artery disease s/p LAD stent in 2013 and 100% stent occlusion with STEMI in June of 2022 requiring balloon angioplasty, hyperlipidemia, remote right thalamic lacunar stroke in May of 2022, remote smoking (2.5 pack years while in middle school/high school), borderline hypertension, and CRVO (poor vision OS) who presented to the ED 05/18/2021 for evaluation of left-sided weakness, gait instability 2/2 dragging his left foot, and slurred speech.  He did not receive IV t-PA due to late presentation (>4.5 hours from time of onset).  Stroke: Acute to early subacute infarct in the right pons, likely small vessel disease CT head -no acute abnormality MRI head - Acute to early subacute infarct in the right pons. CTA H&N - Focal moderate to severe stenosis of a right P3/P4 branch. Focal critical stenosis/short-segment occlusion of the distal left P2 branch with distal reconstitution is similar to the prior MRA. 2D Echo - EF 65 to 70% [employer] Virus 2 - negative LDL - 65 HgbA1c -  6.1 VTE prophylaxis - Lovenox aspirin 81 mg daily and Brilinta (ticagrelor) 90 mg bid prior to admission, now on aspirin 81 mg daily and Brilinta (ticagrelor) 90 mg bid.  Continue on discharge Patient will be counseled to be compliant with his antithrombotic medications Ongoing aggressive stroke risk factor management Therapy recommendations: Outpatient PT OT Disposition: Home today  History of stroke 01/2021 admitted for left leg weakness, CT negative.  MRI right thalamic infarct.  MRA head and neck left distal P2 stenosis.  EF 65 to 70%.  LDL 156, A1c 5.7.  Put on DAPT for 3 weeks and Lipitor 40.  Hypertension Home BP meds: none  Stable Long-term BP goal normotensive  Hyperlipidemia Home Lipid lowering medication: Lipitor 40 mg daily  LDL 65, goal < 70 Current lipid lowering medication: Lipitor 40 mg daily  Continue statin at  discharge  Other Stroke Risk Factors Advanced age Former cigarette smoker - quit Coronary artery disease status post stent 2017 Stent occlusion with STEMI 02/2021 status post PCI, EF 50 to 55% 02/2021  Other Active Problems, Findings, Recommendations and/or Plan Code status - Full code  CKD - stage 3a - creatinine - 1.44->1.40->1.29 Left eye pain - left eye legally blind with history of ? CRVO - outpatient close follow-up with ophthalmology   Hospital day # 1  Neurology will sign off. Please call with questions. Pt will follow up with stroke clinic NP at [facility] in about 4 weeks. Thanks for the consult.  Marvel Plan, MD PhD Stroke Neurology 05/20/2021 12:19 AM   To contact Stroke Continuity provider, please refer to [email or web address]. After hours, contact General Neurology

## 2021-05-19 NOTE — Evaluation (Signed)
Physical Therapy Evaluation Patient Details Name: Philip Parsons MRN: [id number] DOB: Jun 16, 1941 Today's Date: 05/19/2021   History of Present Illness  Pt is an 80 y/o male who presented to the ED as a Code Stroke via EMS for evaluation of left-sided weakness, gait instability with dragging of left foot, and dysarthria. MRI brain revealed acute right pontine infarct. PMH significant for CAD s/p LAD stent in 2013, 100% occlusion of LAD stent with STEMI s/p cardiac catheterization in June 2022, borderline HTN, lacunar stroke in May of 2022, CRVO.   Clinical Impression  Pt admitted with above diagnosis. Pt currently with functional limitations due to the deficits listed below (see PT Problem List). At the time of PT eval pt was able to perform transfers and ambulation with min guard assist progressing to supervision for safety by end of session. It appears that pt is likely near baseline of function, however noted mild LUE deficits in strength and coordination, as well as mild L side inattention. Feel this patient would benefit from outpatient PT to maximize functional independence, decrease risk for falls, and return to PLOF. Of note, pt scored 16/24 on DGI indicating pt is at a higher risk for falls. Will continue to follow acutely.     Follow Up Recommendations Outpatient PT;Supervision - Intermittent    Equipment Recommendations  None recommended by PT    Recommendations for Other Services       Precautions / Restrictions Precautions Precautions: Fall Restrictions Weight Bearing Restrictions: No      Mobility  Bed Mobility Overal bed mobility: Modified Independent             General bed mobility comments: Increased time required with several cues to scoot fully out to EOB and place feet flat on the floor. Pt appeared to be distracted by telemetry leads and wires.    Transfers Overall transfer level: Needs assistance Equipment used: None Transfers: Sit to/from Stand Sit to  Stand: Supervision         General transfer comment: Light supervision for safety - pt motivated to participate and stood quickly from EOB without LOB.  Ambulation/Gait Ambulation/Gait assistance: Min guard;Supervision Gait Distance (Feet): 400 Feet Assistive device: None Gait Pattern/deviations: Step-through pattern;Decreased stride length;Trunk flexed Gait velocity: Decreased Gait velocity interpretation: 1.31 - 2.62 ft/sec, indicative of limited community ambulator General Gait Details: Pt with grossly flexed trunk throughout ambulation - likely baseline. Progressed to supervision for safety prior to end of gait training.  Stairs Stairs: Yes Stairs assistance: Supervision Stair Management: Two rails;Alternating pattern;Forwards Number of Stairs: 5 General stair comments: Stairs in rehab gym as part of the DGI. No assist required.  Wheelchair Mobility    Modified Rankin (Stroke Patients Only)       Balance Overall balance assessment: Needs assistance Sitting-balance support: Feet supported;No upper extremity supported Sitting balance-Leahy Scale: Fair Sitting balance - Comments: Posterior lean noted with LE MMT. Postural control: Posterior lean Standing balance support: No upper extremity supported;During functional activity Standing balance-Leahy Scale: Fair                   Standardized Balance Assessment Standardized Balance Assessment : Dynamic Gait Index   Dynamic Gait Index Level Surface: Mild Impairment Change in Gait Speed: Moderate Impairment Gait with Horizontal Head Turns: Mild Impairment Gait with Vertical Head Turns: Mild Impairment Gait and Pivot Turn: Normal Step Over Obstacle: Mild Impairment Step Around Obstacles: Mild Impairment Steps: Mild Impairment Total Score: 16       Pertinent  Vitals/Pain Pain Assessment: No/denies pain    Home Living Family/patient expects to be discharged to:: Private residence Living Arrangements:  Spouse/significant other Available Help at Discharge: Available PRN/intermittently Type of Home: House Home Access: Stairs to enter Entrance Stairs-Rails: Can reach both Entrance Stairs-Number of Steps: 4 Home Layout: Two level Home Equipment: Environmental consultant - 2 wheels;Grab bars - tub/shower Additional Comments: daughter will be in town for a week    Prior Function Level of Independence: Independent         Comments: drives, does own finances, medication managment     Hand Dominance   Dominant Hand: Right    Extremity/Trunk Assessment   Upper Extremity Assessment Upper Extremity Assessment: LUE deficits/detail LUE Deficits / Details: Noted mild weakness in LUE mainly in biceps, triceps, shoulder flexion. Mild coordination deficits noted with L UE - dysdiadochokinesia with rapid pronation/supination, and mild inattention to the R at times - leaving soap unrinsed and unwiped off his L hand after washing hands, cutting corners very closely when turning to the L. LUE Sensation: WNL    Lower Extremity Assessment Lower Extremity Assessment: Overall WFL for tasks assessed    Cervical / Trunk Assessment Cervical / Trunk Assessment: Other exceptions Cervical / Trunk Exceptions: Forward head posture with rounded shoulders  Communication   Communication: No difficulties  Cognition Arousal/Alertness: Awake/alert Behavior During Therapy: WFL for tasks assessed/performed Overall Cognitive Status: Impaired/Different from baseline Area of Impairment: Memory;Problem solving                     Memory: Decreased short-term memory       Problem Solving: Slow processing General Comments: Pt completed the medicog and scored 4/10. Pt demonstrated limitations with memory, problem solving, organizing with written instructions. Pt did not complete isntruction 1 with medication transfer screen.      General Comments General comments (skin integrity, edema, etc.): VSS during session     Exercises     Assessment/Plan    PT Assessment Patient needs continued PT services  PT Problem List Decreased strength;Decreased balance;Decreased cognition;Decreased mobility;Decreased coordination;Decreased safety awareness;Decreased knowledge of precautions       PT Treatment Interventions DME instruction;Gait training;Stair training;Functional mobility training;Therapeutic activities;Therapeutic exercise;Neuromuscular re-education;Patient/family education;Cognitive remediation;Balance training    PT Goals (Current goals can be found in the Care Plan section)  Acute Rehab PT Goals Patient Stated Goal: to go home PT Goal Formulation: With patient Time For Goal Achievement: 05/26/21 Potential to Achieve Goals: Good    Frequency Min 4X/week   Barriers to discharge        Co-evaluation               AM-PAC PT "6 Clicks" Mobility  Outcome Measure Help needed turning from your back to your side while in a flat bed without using bedrails?: None Help needed moving from lying on your back to sitting on the side of a flat bed without using bedrails?: None Help needed moving to and from a bed to a chair (including a wheelchair)?: A Little Help needed standing up from a chair using your arms (e.g., wheelchair or bedside chair)?: A Little Help needed to walk in hospital room?: A Little Help needed climbing 3-5 steps with a railing? : A Little 6 Click Score: 20    End of Session Equipment Utilized During Treatment: Gait belt Activity Tolerance: Patient tolerated treatment well Patient left: in chair;with call bell/phone within reach;with family/visitor present Nurse Communication: Mobility status PT Visit Diagnosis: Other symptoms and signs  involving the nervous system (R29.898)    Time: 6825-7493 PT Time Calculation (min) (ACUTE ONLY): 27 min   Charges:   PT Evaluation $PT Eval Low Complexity: 1 Low PT Treatments $Physical Performance Test: 8-22 mins        Conni Slipper, PT, DPT Acute Rehabilitation Services Pager: [phone] Office: [phone]   Marylynn Pearson 05/19/2021, 3:00 PM

## 2021-05-19 NOTE — Plan of Care (Signed)
Min assist with adls 

## 2021-05-19 NOTE — Evaluation (Addendum)
Occupational Therapy Evaluation Patient Details Name: Philip Parsons MRN: [id number] DOB: April 29, 1941 Today's Date: 05/19/2021    History of Present Illness 80 y.o. male with PMHx of CAD s/p LAD stent in 2013, 100% occlusion of LAD stent with STEMI s/p cardiac catheterization in June 2022, borderline HTN, HLD, lacunar stroke in May of 2022, remote smoking history, and CRVO who presented to the ED as a Code Stroke via EMS for evaluation of left-sided weakness, gait instability with dragging of left foot, and dysarthria. MRI brain revealed acute right pontine infarct.   Clinical Impression   PTA, pt was living at home with his wife, who was recently diagnose with cancer. Pt reports he was independent with ADL/IADL and functional mobility without use of AD. Pt's daughter present during session and reports pt has been having increased difficulty with memory and problem solving. Pt score 4/10 on medicog assessment indicating limitations with executive functioning skills. Pt currently requires minguard for ambulation in the hallway while turning head. He otherwise required supervision for completion of ADL. Due to decline in current level of function, pt would benefit from outpatient OT to address cognition and balance with more dynamic activities.  At this time, recommend outpatient follow-up. Per RN, pt with possible d/c this date. Pt is at appropriate functioning to discharge home with his daughter's assistance this week. Will continue to follow acutely.      Follow Up Recommendations  Outpatient OT    Equipment Recommendations  None recommended by OT    Recommendations for Other Services PT consult     Precautions / Restrictions Precautions Precautions: Fall Restrictions Weight Bearing Restrictions: No      Mobility Bed Mobility Overal bed mobility: Modified Independent                  Transfers Overall transfer level: Needs assistance Equipment used: None Transfers: Sit  to/from Stand Sit to Stand: Supervision         General transfer comment: supervision for safety, pt stood x3 during session. with 3rd sit>stand pt with poor powerup and flopped back to sitting EOB    Balance Overall balance assessment: Mild deficits observed, not formally tested During ambulation in the hallway, pt noted to have increased speed of gait and short shuffle. No loss of balance noted. Pt also noted to start to ambulate toward gaze with head turns and almost bumping into objects. Minguard for functional mobility outside of the room. Pt ambulated to the gym and completed stairs with BUE support on rails. Communicated pt's instability and fall risk with PT, pt and daughter.                                          ADL either performed or assessed with clinical judgement   ADL Overall ADL's : At baseline                                       General ADL Comments: able to complete grooming, toileting, and dressing at modified independent level;Pt required minguard for functional mobility in the hallway with pt turning head in different directions, pt with increased instability and veering toward direction he was looking, almost bumping into objects.     Vision Baseline Vision/History: 1 Wears glasses Ability to See in Adequate Light: 1  Impaired Patient Visual Report: No change from baseline Vision Assessment?: No apparent visual deficits (pt able to read without limitations)     Perception     Praxis      Pertinent Vitals/Pain Pain Assessment: No/denies pain     Hand Dominance Right   Extremity/Trunk Assessment Upper Extremity Assessment Upper Extremity Assessment: Overall WFL for tasks assessed   Lower Extremity Assessment Lower Extremity Assessment: Defer to PT evaluation   Cervical / Trunk Assessment Cervical / Trunk Assessment: Normal   Communication Communication Communication: No difficulties   Cognition  Arousal/Alertness: Awake/alert Behavior During Therapy: WFL for tasks assessed/performed Overall Cognitive Status: Impaired/Different from baseline Area of Impairment: Memory;Problem solving                     Memory: Decreased short-term memory       Problem Solving: Slow processing General Comments: Pt completed the medicog and scored 4/10. Pt demonstrated limitations with memory, problem solving, organizing with written instructions. Pt did not complete isntruction 1 with medication transfer screen.  The Medi-cog consists of the mini-cog (3 item recall, clock draw task (up to 5 points)) pt scored 0/5 on this section and medication transfer screen (up to 5points). Pt demonstrated difficulty with medication transfer screen, she scored 4/5 for this section. Pt demonstrated difficulty with following written instructions, problem solving, organiziation, and overall executive functioning skills indiciating pt may have difficulty with managing medications, cooking, and other tasks requiring higher executive functioning skills. Pt will benefit from increased supervision to assist with medication management.   General Comments  VSS during session    Exercises     Shoulder Instructions      Home Living Family/patient expects to be discharged to:: Private residence Living Arrangements: Spouse/significant other Available Help at Discharge: Available PRN/intermittently Type of Home: House Home Access: Stairs to enter [employer] of Steps: 4 Entrance Stairs-Rails: Can reach both Home Layout: Two level Alternate Level Stairs-Number of Steps: flight Alternate Level Stairs-Rails: Right Bathroom Shower/Tub: Walk-in shower;Tub/shower unit   Bathroom Toilet: Standard Bathroom Accessibility: Yes How Accessible: Accessible via walker Home Equipment: Walker - 2 wheels;Grab bars - tub/shower   Additional Comments: daughter will be in town for a week      Prior  Functioning/Environment Level of Independence: Independent        Comments: drives;does own finances, medication managment        OT Problem List: Impaired balance (sitting and/or standing);Decreased cognition      OT Treatment/Interventions: Self-care/ADL training;Cognitive remediation/compensation;Balance training    OT Goals(Current goals can be found in the care plan section) Acute Rehab OT Goals Patient Stated Goal: to go home OT Goal Formulation: With patient Time For Goal Achievement: 06/02/21 Potential to Achieve Goals: Good  OT Frequency: Min 2X/week   Barriers to D/C:            Co-evaluation              AM-PAC OT "6 Clicks" Daily Activity     Outcome Measure Help from another person eating meals?: None Help from another person taking care of personal grooming?: None Help from another person toileting, which includes using toliet, bedpan, or urinal?: A Little Help from another person bathing (including washing, rinsing, drying)?: None Help from another person to put on and taking off regular upper body clothing?: None Help from another person to put on and taking off regular lower body clothing?: None 6 Click Score: 23   End of Session  Equipment Utilized During Treatment: Stage manager Communication: Mobility status  Activity Tolerance: Patient tolerated treatment well Patient left: in bed;with call bell/phone within reach;with bed alarm set;with family/visitor present  OT Visit Diagnosis: Other abnormalities of gait and mobility (R26.89);Other symptoms and signs involving cognitive function                Time: 1141-1220 OT Time Calculation (min): 39 min Charges:  OT General Charges $OT Visit: 1 Visit OT Evaluation $OT Eval Moderate Complexity: 1 Mod OT Treatments $Self Care/Home Management : 8-22 mins  Rosey Bath OTR/L Acute Rehabilitation Services Office: [phone]   Rebeca Alert 05/19/2021, 2:26 PM

## 2021-05-19 NOTE — Plan of Care (Signed)

## 2021-05-19 NOTE — Discharge Summary (Signed)
Physician Discharge Summary  Philip Parsons QIH:[id number] DOB: Oct 25, 1940 DOA: 05/18/2021  PCP: Blair Heys, MD  Admit date: 05/18/2021 Discharge date: 05/19/2021  Admitted From: Home  Disposition:  Home   Recommendations for Outpatient Follow-up:  Follow up with PCP in 1-2 weeks Follow up with Neurology in 4-6 weeks Follow up with Ophthalmology in 1 day        Home Health: None  Equipment/Devices: None new  Discharge Condition: Good  CODE STATUS: FULL Diet recommendation: Cardiac  Brief/Interim Summary: [name] is a 80 y.o. M with CAD s/p balloon angioplasty in July transitioned from Plavix to Brilinta, CKD IIIa, hx left eye CRAO, recent CVA in May and pre-diabetes who presented with L hemiparesis and slurred speech.    In the ER, CT head unremarkable, seen by Neuro and out of window for lytics. Admitted for stroke work up.            PRINCIPAL HOSPITAL DIAGNOSIS: Acute stroke    Discharge Diagnoses:   Acute right pontine infarct MRI brain shows acute right pons infarct. Non-invasive angiography showed focal P3/4 stenosis and P2 oclusion. -Echocardiogram showed no cardiogenic source of embolism -Carotids imaged and clear -Lipids ordered: markedly down from May when he started statin -Aspirin ordered at admission: continue aspirin and Brilinta -Atrial fibrillation: not present -tPA not given because outside window -Dysphagia screen ordered in ER -PT eval ordered  -Smoking cessation: not pertinent    Central retinal artery occlusion Left eye pain Discussed with ophthalmology.  In light of normal tonometry two nights ago, would recommend outpatietn follow up.  Follow up contact information provided to patient  CKD IIIa  Coronary disease Rcent STEMI and balloon angioplasty  Pre-diabetes          Discharge Instructions  Discharge Instructions     Ambulatory referral to Neurology   Complete by: As directed    An appointment is  requested in approximately: 4 weeks   Ambulatory referral to Occupational Therapy   Complete by: As directed    Ambulatory referral to Physical Therapy   Complete by: As directed       Allergies as of 05/19/2021       Reactions   Meloxicam Other (See Comments)   Feels bad (reported by [facility] 2021)        Medication List     TAKE these medications    acetaminophen 500 MG tablet Commonly known as: TYLENOL Take 1 tablet (500 mg total) by mouth every 4 (four) hours as needed for mild pain (or temp > 37.5 C (99.5 F)).   aspirin EC 81 MG tablet Take 1 tablet (81 mg total) by mouth daily. Swallow whole.   atorvastatin 40 MG tablet Commonly known as: LIPITOR Take 1 tablet (40 mg total) by mouth daily.   CoQ10 100 MG Caps Take 100 mg by mouth daily.   latanoprost 0.005 % ophthalmic solution Commonly known as: XALATAN Place 1 drop into the left eye at bedtime.   MAGNESIUM PO Take 250 mg by mouth daily.   nitroGLYCERIN 0.4 MG SL tablet Commonly known as: NITROSTAT Place 1 tablet (0.4 mg total) under the tongue every 5 (five) minutes as needed for chest pain.   ticagrelor 90 MG Tabs tablet Commonly known as: BRILINTA Take 1 tablet (90 mg total) by mouth 2 (two) times daily.   VITAMIN C PO Take 500 mg by mouth daily.   vitamin D3 50 MCG (2000 UT) Caps Take 2,000 Units by mouth daily.  vitamin E 180 MG (400 UNITS) capsule Take 400 Units by mouth daily.        Follow-up Information     [facility]. Schedule an appointment as soon as possible for a visit.   Specialty: Rehabilitation Why: Please call and schedule an appointment Contact information: [address] [id number] [place] [address] [phone]        Blair Heys, MD. Schedule an appointment as soon as possible for a visit in 1 week(s).   Specialty: Family Medicine Contact information: [address] [place] [address] [phone]         [facility]. Schedule an appointment as soon as possible for a visit in 1 month(s).   Contact information: [address]     [address] [place] [address] [phone]        Dimitri Ped, MD. Schedule an appointment as soon as possible for a visit in 1 day(s).   Specialty: Surgery Contact information: [address] [place] [address] [phone]         Deno Etienne, MD. Schedule an appointment as soon as possible for a visit in 1 day(s).   Contact information: [address] [place] [address] [phone]                Allergies  Allergen Reactions   Meloxicam Other (See Comments)    Feels bad (reported by [facility] 2021)    Consultations: Neurology   Procedures/Studies: MR BRAIN WO CONTRAST  Result Date: 05/18/2021 CLINICAL DATA:  Neuro deficit, acute stroke suspected EXAM: MRI HEAD WITHOUT CONTRAST TECHNIQUE: Multiplanar, multiecho pulse sequences of the brain and surrounding structures were obtained without intravenous contrast. COMPARISON:  Same day CT/CTA head FINDINGS: Brain: There is diffusion restriction with associated T2/FLAIR signal abnormality in the right pons consistent with evolving acute to early subacute infarct. There is no evidence of acute intracranial hemorrhage. Additional foci of FLAIR signal abnormality in the subcortical and periventricular white matter likely reflects sequela of chronic white matter microangiopathy. There is mild global parenchymal volume loss with disproportionate hippocampal atrophy, unchanged. There is no midline shift. Vascular: Normal flow voids. Skull and upper cervical spine: Normal marrow signal. Sinuses/Orbits: The paranasal sinuses are clear. Bilateral lens implants are in place. The orbits and globes are otherwise unremarkable. Other: None. IMPRESSION: Acute to early subacute infarct  in the right pons. These results were called by telephone at the time of interpretation on 05/18/2021 at 6:43 pm to provider [facility] , who verbally acknowledged these results. Electronically Signed   By: Lesia Hausen M.D.   On: 05/18/2021 18:43   ECHOCARDIOGRAM COMPLETE  Result Date: 05/19/2021    ECHOCARDIOGRAM REPORT   Patient Name:   Sidhant TALLAN SANDOZ Date of Exam: 05/19/2021 Medical Rec #:  [id number]    Height:       69.0 in Accession #:    [id number]   Weight:       149.0 lb Date of Birth:  1941-05-29    BSA:          1.823 m Patient Age:    80 years     BP:           159/73 mmHg Patient Gender: M            HR:           55 bpm. Exam Location:  Inpatient Procedure: 2D Echo, Cardiac  Doppler, Color Doppler and 3D Echo Indications:    Stroke  History:        Patient has prior history of Echocardiogram examinations, most                 recent 02/16/2021. CAD and Previous Myocardial Infarction; Risk                 Factors:Dyslipidemia.  Sonographer:    Roosvelt Maser RDCS Referring Phys: [id number] [facility] IMPRESSIONS  1. Left ventricular ejection fraction, by estimation, is 65 to 70%. Left ventricular ejection fraction by 3D volume is 68 %. The left ventricle has normal function. The left ventricle has no regional wall motion abnormalities. There is mild left ventricular hypertrophy. Left ventricular diastolic parameters are consistent with Grade I diastolic dysfunction (impaired relaxation).  2. Right ventricular systolic function is mildly reduced. The right ventricular size is normal. Tricuspid regurgitation signal is inadequate for assessing PA pressure.  3. The mitral valve is normal in structure. Trivial mitral valve regurgitation. No evidence of mitral stenosis.  4. The aortic valve is abnormal. There is moderate calcification of the aortic valve. Aortic valve regurgitation is not visualized. Mild aortic valve sclerosis is present, with no evidence of aortic valve stenosis.  5. The inferior vena cava  is normal in size with greater than 50% respiratory variability, suggesting right atrial pressure of 3 mmHg. Conclusion(s)/Recommendation(s): No intracardiac source of embolism detected on this transthoracic study. FINDINGS  Left Ventricle: Left ventricular ejection fraction, by estimation, is 65 to 70%. Left ventricular ejection fraction by 3D volume is 68 %. The left ventricle has normal function. The left ventricle has no regional wall motion abnormalities. The left ventricular internal cavity size was normal in size. There is mild left ventricular hypertrophy. Left ventricular diastolic parameters are consistent with Grade I diastolic dysfunction (impaired relaxation). Right Ventricle: The right ventricular size is normal. No increase in right ventricular wall thickness. Right ventricular systolic function is mildly reduced. Tricuspid regurgitation signal is inadequate for assessing PA pressure. Left Atrium: Left atrial size was normal in size. Right Atrium: Right atrial size was normal in size. Pericardium: There is no evidence of pericardial effusion. Mitral Valve: The mitral valve is normal in structure. There is mild calcification of the mitral valve leaflet(s). Trivial mitral valve regurgitation. No evidence of mitral valve stenosis. Tricuspid Valve: The tricuspid valve is normal in structure. Tricuspid valve regurgitation is trivial. No evidence of tricuspid stenosis. Aortic Valve: The aortic valve is abnormal. There is moderate calcification of the aortic valve. Aortic valve regurgitation is not visualized. Mild aortic valve sclerosis is present, with no evidence of aortic valve stenosis. Aortic valve mean gradient measures 4.0 mmHg. Aortic valve peak gradient measures 8.0 mmHg. Aortic valve area, by VTI measures 2.50 cm. Pulmonic Valve: The pulmonic valve was normal in structure. Pulmonic valve regurgitation is not visualized. No evidence of pulmonic stenosis. Aorta: The aortic root is normal in size  and structure. Venous: The inferior vena cava is normal in size with greater than 50% respiratory variability, suggesting right atrial pressure of 3 mmHg. IAS/Shunts: No atrial level shunt detected by color flow Doppler.  LEFT VENTRICLE PLAX 2D LVIDd:         3.70 cm         Diastology LVIDs:         2.30 cm         LV e' medial:    8.70 cm/s LV PW:  1.20 cm         LV E/e' medial:  8.3 LV IVS:        1.00 cm         LV e' lateral:   11.20 cm/s LVOT diam:     2.00 cm         LV E/e' lateral: 6.5 LV SV:         83 LV SV Index:   45 LVOT Area:     3.14 cm        3D Volume EF                                LV 3D EF:    Left                                             ventricul                                             ar                                             ejection                                             fraction                                             by 3D                                             volume is                                             68 %.                                 3D Volume EF:                                3D EF:        68 %                                LV EDV:       104 ml  LV ESV:       33 ml                                LV SV:        71 ml RIGHT VENTRICLE RV Basal diam:  2.80 cm LEFT ATRIUM             Index       RIGHT ATRIUM           Index LA diam:        2.80 cm 1.54 cm/m  RA Area:     14.60 cm LA Vol (A2C):   42.9 ml 23.53 ml/m RA Volume:   39.10 ml  21.45 ml/m LA Vol (A4C):   35.3 ml 19.36 ml/m LA Biplane Vol: 39.4 ml 21.61 ml/m  AORTIC VALVE AV Area (Vmax):    2.87 cm AV Area (Vmean):   2.59 cm AV Area (VTI):     2.50 cm AV Vmax:           141.00 cm/s AV Vmean:          92.800 cm/s AV VTI:            0.330 m AV Peak Grad:      8.0 mmHg AV Mean Grad:      4.0 mmHg LVOT Vmax:         129.00 cm/s LVOT Vmean:        76.600 cm/s LVOT VTI:          0.263 m LVOT/AV VTI ratio: 0.80  AORTA Ao Root diam: 2.90 cm MITRAL  VALVE MV Area (PHT): 2.95 cm     SHUNTS MV Decel Time: 257 msec     Systemic VTI:  0.26 m MV E velocity: 72.40 cm/s   Systemic Diam: 2.00 cm MV A velocity: 100.00 cm/s MV E/A ratio:  0.72 Weston Brass MD Electronically signed by Weston Brass MD Signature Date/Time: 05/19/2021/12:28:33 PM    Final    CT HEAD CODE STROKE WO CONTRAST  Result Date: 05/18/2021 CLINICAL DATA:  Code stroke.  Left-sided weakness and slurred speech EXAM: CT HEAD WITHOUT CONTRAST TECHNIQUE: Contiguous axial images were obtained from the base of the skull through the vertex without intravenous contrast. COMPARISON:  CT head 02/15/2021, brain MRI 02/15/2021 FINDINGS: Brain: There is no evidence of acute intracranial hemorrhage, extra-axial fluid collection, or infarct. The ventricles are stable in size. There is hippocampal atrophy out of proportion to degree of parenchymal atrophy, unchanged no mass lesion is identified. There is no midline shift. Vascular: There is calcification of the bilateral cavernous ICAs. Skull: Normal. Negative for fracture or focal lesion. Sinuses/Orbits: The imaged paranasal sinuses are clear. The mastoid air cells are clear. Bilateral lens implants are in place. The globes and orbits are otherwise unremarkable. Other: None. ASPECTS [facility] Stroke Program Early CT Score) - Ganglionic level infarction (caudate, lentiform nuclei, internal capsule, insula, M1-M3 cortex): 7 - Supraganglionic infarction (M4-M6 cortex): 3 Total score (0-10 with 10 being normal): 10 IMPRESSION: 1. No acute intracranial hemorrhage or infarct. 2. ASPECTS is 10 3. Hippocampal atrophy out of proportion to the degree of parenchymal atrophy, unchanged. These results were called by telephone at the time of interpretation on 05/18/2021 at 3:36 pm to provider [facility] , who verbally acknowledged these results. Electronically Signed   By: Lesia Hausen M.D.   On: 05/18/2021 15:40   CT ANGIO HEAD NECK  W WO CM (CODE STROKE)  Result  Date: 05/18/2021 CLINICAL DATA:  Neuro deficit, acute stroke suspected EXAM: CT ANGIOGRAPHY HEAD AND NECK TECHNIQUE: Multidetector CT imaging of the head and neck was performed using the standard protocol during bolus administration of intravenous contrast. Multiplanar CT image reconstructions and MIPs were obtained to evaluate the vascular anatomy. Carotid stenosis measurements (when applicable) are obtained utilizing NASCET criteria, using the distal internal carotid diameter as the denominator. CONTRAST:  1mL OMNIPAQUE IOHEXOL 350 MG/ML SOLN COMPARISON:  Same day noncontrast CT head, MRA head/neck 02/16/2021, CT head 02/15/2021 FINDINGS: CTA NECK FINDINGS Aortic arch: There is minimal calcification of the aortic arch. The aortic arch is otherwise unremarkable. Right carotid system: There is mild calcified atherosclerotic plaque of the right carotid bulb without hemodynamically significant stenosis or occlusion. There is no dissection or aneurysm. Left carotid system: There is mild calcified atherosclerotic plaque of the left carotid bulb without hemodynamically significant stenosis or occlusion. There is no dissection or aneurysm. Vertebral arteries: Codominant. No evidence of dissection, stenosis (50% or greater) or occlusion. Skeleton: There is mild multilevel degenerative change of the cervical spine. There is no acute osseous abnormality or aggressive osseous lesion. Other neck: Bilateral palatine tonsilliths are noted. The soft tissues are otherwise unremarkable. Upper chest: The lung apices are clear. Review of the MIP images confirms the above findings CTA HEAD FINDINGS Anterior circulation: There is calcified atherosclerotic plaque of the bilateral intracranial internal carotid arteries without hemodynamically significant stenosis or occlusion. The bilateral ACAs and MCAs are patent. Posterior circulation: There is loss of enhancement of a distal left P2 branch with distal reconstitution, likely not  significantly changed compared to the prior MRA allowing for difference in modality. Mild multifocal irregularity of the distal left PCA branches without high-grade stenosis or occlusion likely reflects atherosclerotic plaque. There is focal moderate to severe stenosis of a right P3/P4 branch with distal reconstitution (8-276, 9-131), which appears slightly worse compared to the prior MRA. The bilateral V4 segments and basilar artery are patent. Venous sinuses: As permitted by contrast timing, patent. Anatomic variants: Posterior communicating arteries are not definitely visualized. Review of the MIP images confirms the above findings IMPRESSION: 1. Focal moderate to severe stenosis of a right P3/P4 branch appears slightly worse compared to the prior MRA, allowing for difference in modality. 2. Focal critical stenosis/short-segment occlusion of the distal left P2 branch with distal reconstitution is similar to the prior MRA, allowing for difference in modality. 3. Otherwise, patent vasculature of the head and neck. These results were called by telephone at the time of interpretation on 05/18/2021 at 4:08 pm to provider [facility] , who verbally acknowledged these results. Electronically Signed   By: Lesia Hausen M.D.   On: 05/18/2021 16:10      Subjective: Feeling well.  Has some left eye pain, but no confusion, speech and muscle strenght seems better.  Discharge Exam: Vitals:   05/19/21 0838 05/19/21 1203  BP: (!) 159/73 (!) 153/70  Pulse: (!) 55 62  Resp: 14 15  Temp: 97.8 F (36.6 C) 97.6 F (36.4 C)  SpO2: 98% 100%   Vitals:   05/19/21 0201 05/19/21 0410 05/19/21 0838 05/19/21 1203  BP:  (!) 141/69 (!) 159/73 (!) 153/70  Pulse:  (!) 58 (!) 55 62  Resp:  14 14 15   Temp:  97.9 F (36.6 C) 97.8 F (36.6 C) 97.6 F (36.4 C)  TempSrc:  Oral Oral Oral  SpO2:  99% 98% 100%  Weight: 67.6  kg     Height: 5\' 9"  (1.753 m)       General: Pt is alert, awake, not in acute  distress Cardiovascular: RRR, nl S1-S2, no murmurs appreciated.   No LE edema.   Respiratory: Normal respiratory rate and rhythm.  CTAB without rales or wheezes. Abdominal: Abdomen soft and non-tender.  No distension or HSM.   Neuro/Psych: Strength symmetric in upper and lower extremities.  Judgment and insight appear normal.   The results of significant diagnostics from this hospitalization (including imaging, microbiology, ancillary and laboratory) are listed below for reference.     Microbiology: Recent Results (from the past 240 hour(s))  SARS CORONAVIRUS 2 (TAT 6-24 HRS) Nasopharyngeal Nasopharyngeal Swab     Status: None   Collection Time: 05/18/21  8:50 PM   Specimen: Nasopharyngeal Swab  Result Value Ref Range Status   SARS Coronavirus 2 NEGATIVE NEGATIVE Final    Comment: (NOTE) SARS-CoV-2 target nucleic acids are NOT DETECTED.  The SARS-CoV-2 RNA is generally detectable in upper and lower respiratory specimens during the acute phase of infection. Negative results do not preclude SARS-CoV-2 infection, do not rule out co-infections with other pathogens, and should not be used as the sole basis for treatment or other patient management decisions. Negative results must be combined with clinical observations, patient history, and epidemiological information. The expected result is Negative.  Fact Sheet for Patients: HairSlick.nohttps://www.fda.gov/media/138098/download  Fact Sheet for Healthcare Providers: quierodirigir.comhttps://www.fda.gov/media/138095/download  This test is not yet approved or cleared by the [place] FDA and  has been authorized for detection and/or diagnosis of SARS-CoV-2 by FDA under an Emergency Use Authorization (EUA). This EUA will remain  in effect (meaning this test can be used) for the duration of the COVID-19 declaration under Se ction 564(b)(1) of the Act, 21 U.S.C. section 360bbb-3(b)(1), unless the authorization is terminated or revoked sooner.  Performed at  [facility], [address] [place], [place] [address]      Labs: BNP (last 3 results) No results for input(s): BNP in the last 8760 hours. Basic Metabolic Panel: Recent Labs  Lab 05/18/21 1514 05/18/21 1521 05/18/21 1742  NA 139 141  --   K 3.5 3.5  --   CL 104 105  --   CO2 28  --   --   GLUCOSE 92 92  --   BUN 21 22  --   CREATININE 1.44* 1.40* 1.29*  CALCIUM 8.5*  --   --    Liver Function Tests: Recent Labs  Lab 05/18/21 1514  AST 23  ALT 18  ALKPHOS 61  BILITOT 0.5  PROT 6.5  ALBUMIN 3.0*   No results for input(s): LIPASE, AMYLASE in the last 168 hours. No results for input(s): AMMONIA in the last 168 hours. CBC: Recent Labs  Lab 05/14/21 1243 05/16/21 2015 05/18/21 1514 05/18/21 1521 05/18/21 1742  WBC 7.9 7.6 9.2  --  8.8  NEUTROABS  --   --  5.6  --   --   HGB 12.2* 11.5* 11.7* 11.6* 11.8*  HCT 37.0* 35.4* 36.7* 34.0* 36.8*  MCV 91 94.4 94.6  --  93.9  PLT 333 291 288  --  269   Cardiac Enzymes: No results for input(s): CKTOTAL, CKMB, CKMBINDEX, TROPONINI in the last 168 hours. BNP: Invalid input(s): POCBNP CBG: Recent Labs  Lab 05/18/21 1512  GLUCAP 93   D-Dimer No results for input(s): DDIMER in the last 72 hours. Hgb A1c Recent Labs    05/19/21  0306  HGBA1C 6.1*   Lipid Profile Recent Labs    05/19/21 0306  CHOL 131  HDL 49  LDLCALC 65  TRIG 83  CHOLHDL 2.7   Thyroid function studies No results for input(s): TSH, T4TOTAL, T3FREE, THYROIDAB in the last 72 hours.  Invalid input(s): FREET3 Anemia work up No results for input(s): VITAMINB12, FOLATE, FERRITIN, TIBC, IRON, RETICCTPCT in the last 72 hours. Urinalysis No results found for: COLORURINE, APPEARANCEUR, LABSPEC, PHURINE, GLUCOSEU, HGBUR, BILIRUBINUR, KETONESUR, PROTEINUR, UROBILINOGEN, NITRITE, LEUKOCYTESUR Sepsis Labs Invalid input(s): PROCALCITONIN,  WBC,  LACTICIDVEN Microbiology Recent Results (from the past 240 hour(s))  SARS CORONAVIRUS 2 (TAT  6-24 HRS) Nasopharyngeal Nasopharyngeal Swab     Status: None   Collection Time: 05/18/21  8:50 PM   Specimen: Nasopharyngeal Swab  Result Value Ref Range Status   SARS Coronavirus 2 NEGATIVE NEGATIVE Final    Comment: (NOTE) SARS-CoV-2 target nucleic acids are NOT DETECTED.  The SARS-CoV-2 RNA is generally detectable in upper and lower respiratory specimens during the acute phase of infection. Negative results do not preclude SARS-CoV-2 infection, do not rule out co-infections with other pathogens, and should not be used as the sole basis for treatment or other patient management decisions. Negative results must be combined with clinical observations, patient history, and epidemiological information. The expected result is Negative.  Fact Sheet for Patients: HairSlick.no  Fact Sheet for Healthcare Providers: quierodirigir.com  This test is not yet approved or cleared by the [place] FDA and  has been authorized for detection and/or diagnosis of SARS-CoV-2 by FDA under an Emergency Use Authorization (EUA). This EUA will remain  in effect (meaning this test can be used) for the duration of the COVID-19 declaration under Se ction 564(b)(1) of the Act, 21 U.S.C. section 360bbb-3(b)(1), unless the authorization is terminated or revoked sooner.  Performed at [facility], [address] [place], [place] [address]      Time coordinating discharge: 35 minutes     30 Day Unplanned Readmission Risk Score    Flowsheet Row ED to Hosp-Admission (Discharged) from 03/25/2021 in [place] 2H CARDIOVASCULAR ICU  30 Day Unplanned Readmission Risk Score (%) 15.4 Filed at 03/27/2021 0801       This score is the patient's risk of an unplanned readmission within 30 days of being discharged (0 -100%). The score is based on dignosis, age, lab data, medications, orders, and past utilization.   Low:  0-14.9   Medium: 15-21.9    High: 22-29.9   Extreme: 30 and above            SIGNED:   Alberteen Sam, MD  [facility] 05/19/2021, 9:17 PM

## 2021-05-19 NOTE — ED Notes (Signed)
Gave report to nurse on 3w.

## 2021-05-19 NOTE — Evaluation (Signed)
Speech Language Pathology Evaluation Patient Details Name: Philip Parsons MRN: [id number] DOB: 1941/04/05 Today's Date: 05/19/2021 Time: 6789-3810 SLP Time Calculation (min) (ACUTE ONLY): 30 min  Problem List:  Patient Active Problem List   Diagnosis Date Noted   CVA (cerebral vascular accident) (HCC) 05/18/2021   Acute ST elevation myocardial infarction (STEMI) due to occlusion of left anterior descending (LAD) coronary artery (HCC) 03/25/2021   Coronary stent thrombosis 03/25/2021   Acute ST elevation myocardial infarction (STEMI) of anterior wall (HCC) 03/25/2021   Acute CVA (cerebrovascular accident) (HCC) 02/15/2021   Gout 02/15/2021   AKI (acute kidney injury) (HCC) 02/15/2021   Hyperlipidemia with target LDL less than 70 - refuses medications 10/17/2013   Coronary stent restenosis due to progression of disease 05/10/2013   Coronary atherosclerosis of native coronary artery 05/10/2013   Myocardial infarction, anterolateral wall, acute (HCC) 02/03/2012   Past Medical History:  Past Medical History:  Diagnosis Date   Coronary artery disease    History of intravascular stent placement    No pertinent past medical history    Past Surgical History:  Past Surgical History:  Procedure Laterality Date   CORONARY/GRAFT ACUTE MI REVASCULARIZATION N/A 03/25/2021   Procedure: Coronary/Graft Acute MI Revascularization;  Surgeon: Marykay Lex, MD;  Location: [facility] INVASIVE CV LAB;  Service: Cardiovascular;  Laterality: N/A;   LEFT HEART CATH N/A 02/02/2012   Procedure: LEFT HEART CATH;  Surgeon: Robynn Pane, MD;  Location: [facility] CATH LAB;  Service: Cardiovascular;  Laterality: N/A;   LEFT HEART CATH AND CORONARY ANGIOGRAPHY N/A 03/25/2021   Procedure: LEFT HEART CATH AND CORONARY ANGIOGRAPHY;  Surgeon: Marykay Lex, MD;  Location: [facility] INVASIVE CV LAB;  Service: Cardiovascular;  Laterality: N/A;   LEFT HEART CATHETERIZATION WITH CORONARY ANGIOGRAM N/A 05/10/2013   Procedure: LEFT HEART  CATHETERIZATION WITH CORONARY ANGIOGRAM;  Surgeon: Lesleigh Noe, MD;  Location: [facility] CATH LAB;  Service: Cardiovascular;  Laterality: N/A;   NO PAST SURGERIES     HPI:  80 y.o. 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 Prior medical history significant for right thalamic CVA May 2022, STEMI July 2022.MRI:  Acute to early subacute infarct in the right pons.   Assessment / Plan / Recommendation Clinical Impression  [name] presents with mild deficits in problem-solving and short term memory, mildly exacerbated from baseline functioning. Independent PTA, cares for his wife, drives and manages household/errands.  Demonstrated excellent expressive/receptive language function; good prospective memory, mild short-term recall deficits, mild problem-solving difficulty captured in clock-drawing task (misinterpretation of numbers as related to time).  He acknowledged some memory difficulty PTA.  Demonstrated good awareness; +abstract language use.  No formalized SLP f/u is needed - plan is for OP OT - rec treatment of higher level cognitive processes in the context of OT.    SLP Assessment  SLP Recommendation/Assessment:  (Cognition to be addressed by OT in OP setting) SLP Visit Diagnosis: Cognitive communication deficit (R41.841)    Follow Up Recommendations  None    Frequency and Duration           SLP Evaluation Cognition  Overall Cognitive Status: Impaired/Different from baseline Arousal/Alertness: Awake/alert Orientation Level: Oriented X4 Attention: Selective Selective Attention: Appears intact Memory: Impaired Memory Impairment: Retrieval deficit Awareness: Appears intact Problem Solving: Impaired Problem Solving Impairment: Verbal complex Safety/Judgment: Appears intact       Comprehension  Auditory Comprehension Overall Auditory Comprehension: Appears within functional limits for  tasks assessed    Expression  Expression Primary Mode of Expression: Verbal Verbal Expression Overall Verbal Expression: Appears within functional limits for tasks assessed Written Expression Dominant Hand: Right Written Expression: Not tested   Oral / Motor  Motor Speech Respiration: Within functional limits   GO                   Philip Steveson L. Samson Frederic, MA CCC/SLP Acute Rehabilitation Services Office number [phone] Pager [phone]  Philip Parsons 05/19/2021, 3:03 PM

## 2021-05-21 DIAGNOSIS — H4089 Other specified glaucoma: Secondary | ICD-10-CM | POA: Diagnosis not present

## 2021-05-22 DIAGNOSIS — M316 Other giant cell arteritis: Secondary | ICD-10-CM | POA: Diagnosis not present

## 2021-05-22 DIAGNOSIS — H34812 Central retinal vein occlusion, left eye, with macular edema: Secondary | ICD-10-CM | POA: Diagnosis not present

## 2021-05-22 DIAGNOSIS — H401112 Primary open-angle glaucoma, right eye, moderate stage: Secondary | ICD-10-CM | POA: Diagnosis not present

## 2021-05-25 ENCOUNTER — Ambulatory Visit: Payer: Medicare Other

## 2021-05-25 ENCOUNTER — Other Ambulatory Visit: Payer: Self-pay

## 2021-05-25 ENCOUNTER — Ambulatory Visit: Payer: Medicare Other | Attending: Internal Medicine | Admitting: Occupational Therapy

## 2021-05-25 ENCOUNTER — Encounter: Payer: Self-pay | Admitting: Occupational Therapy

## 2021-05-25 VITALS — BP 128/70 | HR 60

## 2021-05-25 DIAGNOSIS — R2681 Unsteadiness on feet: Secondary | ICD-10-CM

## 2021-05-25 DIAGNOSIS — R2689 Other abnormalities of gait and mobility: Secondary | ICD-10-CM | POA: Diagnosis not present

## 2021-05-25 DIAGNOSIS — M6281 Muscle weakness (generalized): Secondary | ICD-10-CM

## 2021-05-25 NOTE — Therapy (Signed)
[facility] [address] [place], [place], [address] Phone: [phone]   Fax:  [phone]  Occupational Therapy Evaluation  Patient Details  Name: Philip Parsons Philip Parsons Parsons MRN: [id number] Date of Birth: Oct 01, 1940 Referring Provider (OT): Philip Laura, MD   Encounter Date: 05/25/2021   OT End of Session - 05/25/21 1313     Visit Number 1    Number of Visits 1    Authorization Type Medicare and Federal BCBS    Authorization Time Period VL: 75    OT Start Time 1230    OT Stop Time 1300    OT Time Calculation (min) 30 min    Activity Tolerance Patient tolerated treatment well    Behavior During Therapy [facility] for tasks assessed/performed             Past Medical History:  Diagnosis Date   Coronary artery disease    History of intravascular stent placement    No pertinent past medical history     Past Surgical History:  Procedure Laterality Date   CORONARY/GRAFT ACUTE MI REVASCULARIZATION N/A 03/25/2021   Procedure: Coronary/Graft Acute MI Revascularization;  Surgeon: Philip Parsons Lex, MD;  Location: [facility] INVASIVE CV LAB;  Service: Cardiovascular;  Laterality: N/A;   LEFT HEART CATH N/A 02/02/2012   Procedure: LEFT HEART CATH;  Surgeon: Philip Pane, MD;  Location: [facility] CATH LAB;  Service: Cardiovascular;  Laterality: N/A;   LEFT HEART CATH AND CORONARY ANGIOGRAPHY N/A 03/25/2021   Procedure: LEFT HEART CATH AND CORONARY ANGIOGRAPHY;  Surgeon: Philip Parsons Lex, MD;  Location: [facility] INVASIVE CV LAB;  Service: Cardiovascular;  Laterality: N/A;   LEFT HEART CATHETERIZATION WITH CORONARY ANGIOGRAM N/A 05/10/2013   Procedure: LEFT HEART CATHETERIZATION WITH CORONARY ANGIOGRAM;  Surgeon: Philip Parsons Noe, MD;  Location: [facility] CATH LAB;  Service: Cardiovascular;  Laterality: N/A;   NO PAST SURGERIES      There were no vitals filed for this visit.   Subjective Assessment - 05/25/21 1314     Subjective  Pt is a 80 year old male  that presents to Neuro OPOT s/p PCA CVA on 05/18/21. Pt reports his primary concern is with his LLE weakness and balance. Pt denies any UE residual effects.    Patient is accompanied by: Family member   daughters, Philip Parsons Philip Parsons Parsons and Philip Parsons Philip Parsons Parsons   Pertinent History CAD s/p LAD stent 2013, 100% occlusion of LAD stent with STEMI s/p cardiac catheter June 2022, HTN, HLD, lacunar CVA May 2022    Limitations Fall Risk    Patient Stated Goals "not come here and feel like I normally feel"    Currently in Pain? No/denies   patient reports some back pain after CVA but non currently              [facility] OT Assessment - 05/25/21 1235       Assessment   Medical Diagnosis L Sided Weakness, CVA of PCA    Referring Provider (OT) Philip Laura, MD    Onset Date/Surgical Date 05/18/21    Hand Dominance Right      Precautions   Precautions Fall      Balance Screen   Has the patient fallen in the past 6 months No      Home  Environment   Family/patient expects to be discharged to: Private residence    Living Arrangements Spouse/significant other    Available Help at Discharge Family    Type of Home House    Home Layout  Two level   bed and bath upstairs   Educational psychologist      Prior Function   Level of Independence Independent    Vocation Retired    Health and safety inspector    Leisure walking, karate      ADL   Eating/Feeding Modified independent    Grooming Modified independent    Tour manager independent    Lower Body Bathing Modified independent    Upper Body Dressing Independent    Lower Body Dressing Modified independent   increased time d/t LLE   Toilet Transfer Modified independent    Toileting - Clothing Manipulation Modified independent    Toileting -  Hygiene Modified Independent    Tub/Shower Transfer Modified independent      IADL   Prior Level of Function Shopping independent    Shopping Needs to be accompanied on any shopping trip     Prior Level of Function Light Housekeeping independent    Light Housekeeping Performs light daily tasks but cannot maintain acceptable level of cleanliness   daughters completing at this time   Prior Level of Function Meal Prep independent    Meal Prep Able to complete simple cold meal and snack prep    Prior Level of Function Community Mobility independent driver prior    Engineer, drilling on family or friends for transportation    Prior Level of Function Medication Managment independnet    Medication Management Takes responsibility if medication is prepared in advance in seperate dosage    Prior Level of Function Financial Management independent    Landscape architect Manages day-to-day purchases, but needs help with banking, major purchases, etc.;Manages financial matters independently (budgets, writes checks, pays rent, bills goes to bank), collects and keeps track of income      Written Expression   Dominant Hand Right      Vision - History   Baseline Vision Wears glasses only for reading    Visual History Glaucoma   left eye   Additional Comments could not see out of left eye prior to stroke. Pt is following up with opthamologist at this time      Vision Assessment   Eye Alignment Within Functional Limits      Cognition   Overall Cognitive Status Within Functional Limits for tasks assessed    Attention --   table top scanning 100% accuracy - sustained attention     Sensation   Light Touch Appears Intact    Hot/Cold Appears Intact    Proprioception Appears Intact      Coordination   Finger Nose Finger Test R intact, R slight impairment with coordination    9 Hole Peg Test Right;Left    Right 9 Hole Peg Test 36.03s    Left 9 Hole Peg Test 38.10s      ROM / Strength   AROM / PROM / Strength Strength;AROM      AROM   Overall AROM  Within functional limits for tasks performed      Strength   Overall Strength Within functional limits for tasks performed       Hand Function   Right Hand Gross Grasp Functional    Right Hand Grip (lbs) 61.9    Left Hand Gross Grasp Functional    Left Hand Grip (lbs) 45.4                             OT  Education - 05/25/21 1444     Education Details Red Theraputty issued at evaluation    Person(s) Educated Patient;Child(ren)    Methods Explanation;Demonstration    Comprehension Verbalized understanding;Returned demonstration                        Plan - 05/25/21 1439     Clinical Impression Statement Pt is a 80 year old male that presents to Neuro OPOT s/p CVA of PCA on 05/18/21. Pt has significant PMH of CAD s/p LAD stent in 2013, 100% occlusion of LAD stent w STEMI s/p cardiac catheter June 2022, HTN, HLD, lacunar CVA in May 2022. Pt presents with some deficits with cooridation in LUE and weaknes. Skilled occupational therapy is not recommended at this time Pt was issued red theraputty at evaluation for weakness in grip strength.    OT Occupational Profile and History Problem Focused Assessment - Including review of records relating to presenting problem    Occupational performance deficits (Please refer to evaluation for details): Leisure    Body Structure / Function / Physical Skills Strength;Dexterity    Clinical Decision Making Limited treatment options, no task modification necessary    Comorbidities Affecting Occupational Performance: None    Modification or Assistance to Complete Evaluation  No modification of tasks or assist necessary to complete eval    OT Frequency One time visit    OT Treatment/Interventions Therapeutic exercise    Plan one time visit    OT Home Exercise Plan red theraputty    Consulted and Agree with Plan of Care Patient;Family member/caregiver    Family Member Consulted daughters, Philip Parsons Philip Parsons Parsons and Philip Parsons Philip Parsons Parsons             Patient will benefit from skilled therapeutic intervention in order to improve the following deficits and impairments:   Body  Structure / Function / Physical Skills: Strength, Dexterity       Visit Diagnosis: Unsteadiness on feet - Plan: Ot plan of care cert/re-cert    Problem List Patient Active Problem List   Diagnosis Date Noted   CVA (cerebral vascular accident) (HCC) 05/18/2021   Acute ST elevation myocardial infarction (STEMI) due to occlusion of left anterior descending (LAD) coronary artery (HCC) 03/25/2021   Coronary stent thrombosis 03/25/2021   Acute ST elevation myocardial infarction (STEMI) of anterior wall (HCC) 03/25/2021   Acute CVA (cerebrovascular accident) (HCC) 02/15/2021   Gout 02/15/2021   AKI (acute kidney injury) (HCC) 02/15/2021   Hyperlipidemia with target LDL less than 70 - refuses medications 10/17/2013   Coronary stent restenosis due to progression of disease 05/10/2013   Coronary atherosclerosis of native coronary artery 05/10/2013   Myocardial infarction, anterolateral wall, acute (HCC) 02/03/2012    Junious Dresser MOT, OTR/L  05/25/2021, 2:49 PM  [facility] [address] [place], [place], [address] Phone: [phone]   Fax:  [phone]  Name: Philip Parsons Philip Parsons Parsons MRN: [id number] Date of Birth: 24-Feb-1941

## 2021-05-26 NOTE — Patient Instructions (Signed)
Begin walking program 10 min BID Negotiate stairs no more than 3x/day

## 2021-05-26 NOTE — Therapy (Signed)
[facility] [address] [place], [place], [address] Phone: [phone]   Fax:  [phone]  Physical Therapy Evaluation  Patient Details  Name: Philip Parsons MRN: [id number] Date of Birth: 1940-12-03 Referring Provider (PT): Joen Laura MD   Encounter Date: 05/25/2021   PT End of Session - 05/25/21 0735     Visit Number 1    Number of Visits 9    Date for PT Re-Evaluation 06/30/21    Authorization Type Medicare    Progress Note Due on Visit 10    PT Start Time 1615    PT Stop Time 1700    PT Time Calculation (min) 45 min    Equipment Utilized During Treatment Gait belt    Activity Tolerance Patient tolerated treatment well    Behavior During Therapy [facility] for tasks assessed/performed             Past Medical History:  Diagnosis Date   Coronary artery disease    History of intravascular stent placement    No pertinent past medical history     Past Surgical History:  Procedure Laterality Date   CORONARY/GRAFT ACUTE MI REVASCULARIZATION N/A 03/25/2021   Procedure: Coronary/Graft Acute MI Revascularization;  Surgeon: Marykay Lex, MD;  Location: [facility] INVASIVE CV LAB;  Service: Cardiovascular;  Laterality: N/A;   LEFT HEART CATH N/A 02/02/2012   Procedure: LEFT HEART CATH;  Surgeon: Robynn Pane, MD;  Location: [facility] CATH LAB;  Service: Cardiovascular;  Laterality: N/A;   LEFT HEART CATH AND CORONARY ANGIOGRAPHY N/A 03/25/2021   Procedure: LEFT HEART CATH AND CORONARY ANGIOGRAPHY;  Surgeon: Marykay Lex, MD;  Location: [facility] INVASIVE CV LAB;  Service: Cardiovascular;  Laterality: N/A;   LEFT HEART CATHETERIZATION WITH CORONARY ANGIOGRAM N/A 05/10/2013   Procedure: LEFT HEART CATHETERIZATION WITH CORONARY ANGIOGRAM;  Surgeon: Lesleigh Noe, MD;  Location: [facility] CATH LAB;  Service: Cardiovascular;  Laterality: N/A;   NO PAST SURGERIES      Vitals:   05/25/21 1628  BP: 128/70  Pulse: 60  SpO2: 97%       Subjective Assessment - 05/25/21 0730     Subjective Reports L side weakness following mild CVA on 05/18/21.  Has reteurned to ambulation w/o need of AD.  Other than L side weakness, notes he fatigues easily    Patient is accompained by: Family member   daughter   Pertinent History PMH CAD, HTN, gout, hyperlipidemia    Limitations Standing;Walking    How long can you sit comfortably? unlimited    How long can you stand comfortably? <15 min    How long can you walk comfortably? <15 min    Patient Stated Goals Improve LLE strength and gait    Currently in Pain? No/denies                [facility] PT Assessment - 05/25/21 1616       Assessment   Medical Diagnosis L Sided Weakness, CVA of PCA    Referring Provider (PT) Joen Laura MD    Onset Date/Surgical Date 05/18/21    Hand Dominance Right    Prior Therapy OPPT      Precautions   Precautions Fall      Balance Screen   Has the patient fallen in the past 6 months No    Has the patient had a decrease in activity level because of a fear of falling?  No    Is the patient reluctant  to leave their home because of a fear of falling?  No      Home Environment   Living Environment Private residence    Living Arrangements Spouse/significant other    Available Help at Discharge Family    Type of Home House    Home Access Stairs to enter    Entrance Stairs-Number of Steps 2    Entrance Stairs-Rails Right;Left    Home Layout Two level      Prior Function   Level of Independence Independent    Vocation Retired    Health and safety inspector    Leisure walking, Technical sales engineer Movements are Fluid and Coordinated Yes      ROM / Strength   AROM / PROM / Strength Strength      Strength   Strength Assessment Site Knee;Ankle    Right/Left Knee Left    Left Knee Flexion 4/5    Left Knee Extension 4/5    Right/Left Ankle Left    Left Ankle  Dorsiflexion 4/5    Left Ankle Plantar Flexion 4/5      Transfers   Transfers Sit to Stand;Stand to Sit    Sit to Stand 5: Supervision;4: Min guard    Five time sit to stand comments  13.8s      Ambulation/Gait   Ambulation/Gait Yes    Ambulation/Gait Assistance 6: Modified independent (Device/Increase time);5: Supervision    Ambulation Distance (Feet) 100 Feet    Assistive device None    Gait Pattern Step-through pattern;Decreased step length - left;Poor foot clearance - left    Ambulation Surface Level;Indoor    Stair Management Technique Two rails;Alternating pattern    Number of Stairs 4      Functional Gait  Assessment   Gait assessed  Yes    Gait Level Surface Walks 20 ft in less than 7 sec but greater than 5.5 sec, uses assistive device, slower speed, mild gait deviations, or deviates 6-10 in outside of the 12 in walkway width.    Change in Gait Speed Able to smoothly change walking speed without loss of balance or gait deviation. Deviate no more than 6 in outside of the 12 in walkway width.    Gait with Horizontal Head Turns Performs head turns smoothly with no change in gait. Deviates no more than 6 in outside 12 in walkway width    Gait with Vertical Head Turns Performs head turns with no change in gait. Deviates no more than 6 in outside 12 in walkway width.    Gait and Pivot Turn Pivot turns safely in greater than 3 sec and stops with no loss of balance, or pivot turns safely within 3 sec and stops with mild imbalance, requires small steps to catch balance.    Step Over Obstacle Is able to step over one shoe box (4.5 in total height) without changing gait speed. No evidence of imbalance.    Gait with Narrow Base of Support Ambulates less than 4 steps heel to toe or cannot perform without assistance.    Gait with Eyes Closed Walks 20 ft, uses assistive device, slower speed, mild gait deviations, deviates 6-10 in outside 12 in walkway width. Ambulates 20 ft in less than 9 sec but  greater than 7 sec.    Ambulating Backwards Walks 20 ft, uses assistive device, slower speed, mild gait deviations, deviates 6-10 in  outside 12 in walkway width.    Steps Alternating feet, must use rail.    Total Score 21                        Objective measurements completed on examination: See above findings.               PT Education - 05/26/21 0734     Education Details Patient and daughter informed of eval findings, rehab potential and POC and are in agreement    Person(s) Educated Patient;Child(ren)    Methods Explanation    Comprehension Verbalized understanding              PT Short Term Goals - 05/26/21 0746       PT SHORT TERM GOAL #1   Title Establish HEP    Baseline Given walking program    Time 2    Period Weeks    Status New    Target Date 06/16/21      PT SHORT TERM GOAL #2   Title Assess 6 MWT and set goals    Baseline TBD    Time 2    Period Weeks    Status New    Target Date 06/16/21      PT SHORT TERM GOAL #3   Title Patient to negotiate 16 steps with most appropriate pattern    Baseline 4 stairs with B rails and step through pattern    Time 2    Period Weeks    Status New    Target Date 06/16/21               PT Long Term Goals - 05/26/21 0748       PT LONG TERM GOAL #1   Title Pt will be independent with HEP for improved balance and gait.    Baseline walking program    Time 4    Period Weeks    Status New    Target Date 06/30/21      PT LONG TERM GOAL #2   Title Pt will improve FGA score to at least 27/30 to decrease fall risk.    Baseline Initial FGA 21/30    Time 4    Period Weeks    Status New    Target Date 06/30/21      PT LONG TERM GOAL #3   Title Improve 5x STS score to 12s    Baseline Initial score 13.8s    Time 4    Period Weeks    Status New    Target Date 06/30/21      PT LONG TERM GOAL #4   Title Assess 6 MWT and progress towards goal    Baseline TBD    Time 4    Period  Weeks    Status New    Target Date 06/30/21      PT LONG TERM GOAL #5   Title Increase L knee and ankle strength to 4+/5    Baseline L knee and ankle strength 4/5    Time 4    Period Weeks    Status New    Target Date 06/30/21                    Plan - 05/25/21 0736     Clinical Impression Statement Patient presents with L side weakness following mild CVA on 05/18/21.  He has resumed ambulation w/o need of AD and is able to  climb stairs using rail.  He lives with wife in two story home and has returned to I in ADLs.  His goal is to improve L side strength and increase his endurance.  5x STS score is functional but less than PLOF, FGA score has declined since his last PT session.    Personal Factors and Comorbidities Comorbidity 3+    Comorbidities PMH CAD, HTN, gout, hyperlipidemia    Examination-Activity Limitations Locomotion Level;Stairs    Examination-Participation Restrictions Community Activity;Other   Exercise/walking in neighborhood-1-3 miles/day   Stability/Clinical Decision Making Evolving/Moderate complexity    Rehab Potential Good    PT Frequency Other (comment)   1x/wk (including eval), then 2x/wk for 4 weeks   PT Duration Other (comment)   5 weeks total POC   PT Treatment/Interventions ADLs/Self Care Home Management;Gait training;Stair training;Functional mobility training;Therapeutic activities;Therapeutic exercise;Balance training;Neuromuscular re-education;Patient/family education    PT Next Visit Plan 6 MWT. HEP    PT Home Exercise Plan [email or web address]    Consulted and Agree with Plan of Care Patient;Family member/caregiver    Family Member Consulted daughter             Patient will benefit from skilled therapeutic intervention in order to improve the following deficits and impairments:  Abnormal gait, Decreased coordination, Difficulty walking, Impaired perceived functional ability, Decreased balance  Visit Diagnosis: Unsteadiness on feet  Other  abnormalities of gait and mobility  Muscle weakness (generalized)     Problem List Patient Active Problem List   Diagnosis Date Noted   CVA (cerebral vascular accident) (HCC) 05/18/2021   Acute ST elevation myocardial infarction (STEMI) due to occlusion of left anterior descending (LAD) coronary artery (HCC) 03/25/2021   Coronary stent thrombosis 03/25/2021   Acute ST elevation myocardial infarction (STEMI) of anterior wall (HCC) 03/25/2021   Acute CVA (cerebrovascular accident) (HCC) 02/15/2021   Gout 02/15/2021   AKI (acute kidney injury) (HCC) 02/15/2021   Hyperlipidemia with target LDL less than 70 - refuses medications 10/17/2013   Coronary stent restenosis due to progression of disease 05/10/2013   Coronary atherosclerosis of native coronary artery 05/10/2013   Myocardial infarction, anterolateral wall, acute (HCC) 02/03/2012    Hildred Laser PT 05/26/2021, 7:54 AM  [facility] [address] [place], [place], [address] Phone: [phone]   Fax:  [phone]  Name: Philip Parsons MRN: [id number] Date of Birth: [birth year]

## 2021-05-28 DIAGNOSIS — M316 Other giant cell arteritis: Secondary | ICD-10-CM | POA: Diagnosis not present

## 2021-05-28 DIAGNOSIS — R350 Frequency of micturition: Secondary | ICD-10-CM | POA: Diagnosis not present

## 2021-05-28 DIAGNOSIS — I25119 Atherosclerotic heart disease of native coronary artery with unspecified angina pectoris: Secondary | ICD-10-CM | POA: Diagnosis not present

## 2021-05-28 DIAGNOSIS — R7303 Prediabetes: Secondary | ICD-10-CM | POA: Diagnosis not present

## 2021-05-28 DIAGNOSIS — H348122 Central retinal vein occlusion, left eye, stable: Secondary | ICD-10-CM | POA: Diagnosis not present

## 2021-05-28 DIAGNOSIS — I6990 Unspecified sequelae of unspecified cerebrovascular disease: Secondary | ICD-10-CM | POA: Diagnosis not present

## 2021-05-28 DIAGNOSIS — Z8673 Personal history of transient ischemic attack (TIA), and cerebral infarction without residual deficits: Secondary | ICD-10-CM | POA: Diagnosis not present

## 2021-05-29 ENCOUNTER — Other Ambulatory Visit: Payer: Self-pay

## 2021-05-29 ENCOUNTER — Ambulatory Visit: Payer: Medicare Other | Attending: Internal Medicine

## 2021-05-29 DIAGNOSIS — R2681 Unsteadiness on feet: Secondary | ICD-10-CM | POA: Insufficient documentation

## 2021-05-29 DIAGNOSIS — R2689 Other abnormalities of gait and mobility: Secondary | ICD-10-CM | POA: Diagnosis not present

## 2021-05-29 DIAGNOSIS — M6281 Muscle weakness (generalized): Secondary | ICD-10-CM | POA: Diagnosis not present

## 2021-05-29 NOTE — Patient Instructions (Signed)
Access Code: [email or web address] URL: [email or web address] Date: 05/29/2021 Prepared by: Gustavus Bryant  Exercises Sit to Stand with Arms Crossed - 2 x daily - 7 x weekly - 5 reps Heel Toe Raises with Counter Support - 2 x daily - 7 x weekly - 2 sets - 10 reps Tandem Walking with Counter Support - 2 x daily - 7 x weekly - 2 sets - 10 reps - 30s hold Backward Tandem Walking with Counter Support - 2 x daily - 7 x weekly - 2 sets - 10 reps Side Stepping with Counter Support - 2 x daily - 7 x weekly - 2 sets - 10 reps

## 2021-05-29 NOTE — Therapy (Signed)
[facility] [address] [place], [place], [address] Phone: [phone]   Fax:  [phone]  Physical Therapy Treatment  Patient Details  Name: Philip Parsons MRN: [id number] Date of Birth: 1940/12/26 Referring Provider (PT): Joen Laura MD   Encounter Date: 05/29/2021   PT End of Session - 05/29/21 1210     Visit Number 2    Number of Visits 9    Date for PT Re-Evaluation 06/30/21    Authorization Type Medicare    Progress Note Due on Visit 10    PT Start Time 1100    PT Stop Time 1145    PT Time Calculation (min) 45 min    Equipment Utilized During Treatment Gait belt    Activity Tolerance Patient tolerated treatment well    Behavior During Therapy [facility] for tasks assessed/performed             Past Medical History:  Diagnosis Date   Coronary artery disease    History of intravascular stent placement    No pertinent past medical history     Past Surgical History:  Procedure Laterality Date   CORONARY/GRAFT ACUTE MI REVASCULARIZATION N/A 03/25/2021   Procedure: Coronary/Graft Acute MI Revascularization;  Surgeon: Marykay Lex, MD;  Location: [facility] INVASIVE CV LAB;  Service: Cardiovascular;  Laterality: N/A;   LEFT HEART CATH N/A 02/02/2012   Procedure: LEFT HEART CATH;  Surgeon: Robynn Pane, MD;  Location: [facility] CATH LAB;  Service: Cardiovascular;  Laterality: N/A;   LEFT HEART CATH AND CORONARY ANGIOGRAPHY N/A 03/25/2021   Procedure: LEFT HEART CATH AND CORONARY ANGIOGRAPHY;  Surgeon: Marykay Lex, MD;  Location: [facility] INVASIVE CV LAB;  Service: Cardiovascular;  Laterality: N/A;   LEFT HEART CATHETERIZATION WITH CORONARY ANGIOGRAM N/A 05/10/2013   Procedure: LEFT HEART CATHETERIZATION WITH CORONARY ANGIOGRAM;  Surgeon: Lesleigh Noe, MD;  Location: [facility] CATH LAB;  Service: Cardiovascular;  Laterality: N/A;   NO PAST SURGERIES      There were no vitals filed for this visit.   Subjective Assessment  - 05/29/21 1106     Subjective No change to report, no falls pain or med changes    Limitations Standing;Walking    How long can you sit comfortably? unlimited    How long can you stand comfortably? <15 min    How long can you walk comfortably? <15 min    Patient Stated Goals Improve LLE strength and gait    Currently in Pain? No/denies                               [facility] Adult PT Treatment/Exercise - 05/29/21 0001       Transfers   Transfers Sit to Stand;Stand to Sit    Sit to Stand 5: Supervision;4: Min guard      Ambulation/Gait   Ambulation/Gait Yes    Ambulation/Gait Assistance 5: Supervision    Ambulation Distance (Feet) 100 Feet    Assistive device None    Gait Pattern Step-through pattern;Decreased step length - left;Poor foot clearance - left    Ambulation Surface Level;Indoor      Knee/Hip Exercises: Aerobic   Other Aerobic Scifit seat 18 arms 7 L1 x8'                 Balance Exercises - 05/29/21 0001       Balance Exercises: Standing   Standing Eyes Opened Narrow base  of support (BOS);Head turns;Foam/compliant surface;5 reps;30 secs;Limitations    Standing Eyes Opened Limitations 30s EO, 5 hed turns/nods    Standing Eyes Closed Narrow base of support (BOS);Foam/compliant surface;5 reps;30 secs;Limitations    Standing Eyes Closed Limitations 30s static stand, 5 head turns/nods    Tandem Stance Eyes open;Eyes closed;1 rep;30 secs;Limitations    Tandem Stance Time SBA due to balance loss    SLS with Vectors Solid surface;Upper extremity assist 1;Limitations    SLS with Vectors Limitations standing with single UE support tapping floor pebbles, 2 targets unilateral, 2 targets,  alt. 2 targets cross body unilateral then alt, alt. cross body to single target    Tandem Gait Forward;Retro;Upper extremity support;3 reps;Limitations    Tandem Gait Limitations at counter with SBA    Sidestepping Upper extremity support;Limitations;3 reps     Sidestepping Limitations performed at counter                 PT Short Term Goals - 05/29/21 1217       PT SHORT TERM GOAL #1   Title Establish HEP    Baseline Given walking program; [id number]/2/[id number]    Time 2    Period Weeks    Status Achieved    Target Date 06/16/21      PT SHORT TERM GOAL #2   Title Assess 6 MWT and set goals    Baseline TBD    Time 2    Period Weeks    Status New    Target Date 06/16/21      PT SHORT TERM GOAL #3   Title Patient to negotiate 16 steps with most appropriate pattern    Baseline 4 stairs with B rails and step through pattern    Time 2    Period Weeks    Status New    Target Date 06/16/21               PT Long Term Goals - 05/26/21 0748       PT LONG TERM GOAL #1   Title Pt will be independent with HEP for improved balance and gait.    Baseline walking program    Time 4    Period Weeks    Status New    Target Date 06/30/21      PT LONG TERM GOAL #2   Title Pt will improve FGA score to at least 27/30 to decrease fall risk.    Baseline Initial FGA 21/30    Time 4    Period Weeks    Status New    Target Date 06/30/21      PT LONG TERM GOAL #3   Title Improve 5x STS score to 12s    Baseline Initial score 13.8s    Time 4    Period Weeks    Status New    Target Date 06/30/21      PT LONG TERM GOAL #4   Title Assess 6 MWT and progress towards goal    Baseline TBD    Time 4    Period Weeks    Status New    Target Date 06/30/21      PT LONG TERM GOAL #5   Title Increase L knee and ankle strength to 4+/5    Baseline L knee and ankle strength 4/5    Time 4    Period Weeks    Status New    Target Date 06/30/21  Plan - 05/29/21 1213     Clinical Impression Statement Todays session focused on aerobic training, instruction in additional home activities, balance training from compliant surfaces EO/EC and stepping tasks for SLS and foot placement strategies.  Some mild balance loss with  EC tasks and tapping floor targets needing single UE support.  Limited accuracy with stepping tasks which will need to be addresed    Personal Factors and Comorbidities Comorbidity 3+    Comorbidities PMH CAD, HTN, gout, hyperlipidemia    Examination-Activity Limitations Locomotion Level;Stairs    Examination-Participation Restrictions Community Activity;Other   Exercise/walking in neighborhood-1-3 miles/day   Stability/Clinical Decision Making Evolving/Moderate complexity    Rehab Potential Good    PT Frequency Other (comment)   1x/wk (including eval), then 2x/wk for 4 weeks   PT Duration Other (comment)   5 weeks total POC   PT Treatment/Interventions ADLs/Self Care Home Management;Gait training;Stair training;Functional mobility training;Therapeutic activities;Therapeutic exercise;Balance training;Neuromuscular re-education;Patient/family education    PT Next Visit Plan 6 MWT, SLS tasks and foot placement tasks for accuracy    PT Home Exercise Plan [email or web address]    Consulted and Agree with Plan of Care Patient;Family member/caregiver    Family Member Consulted daughter             Patient will benefit from skilled therapeutic intervention in order to improve the following deficits and impairments:  Abnormal gait, Decreased coordination, Difficulty walking, Impaired perceived functional ability, Decreased balance  Visit Diagnosis: Unsteadiness on feet  Muscle weakness (generalized)  Other abnormalities of gait and mobility     Problem List Patient Active Problem List   Diagnosis Date Noted   CVA (cerebral vascular accident) (HCC) 05/18/2021   Acute ST elevation myocardial infarction (STEMI) due to occlusion of left anterior descending (LAD) coronary artery (HCC) 03/25/2021   Coronary stent thrombosis 03/25/2021   Acute ST elevation myocardial infarction (STEMI) of anterior wall (HCC) 03/25/2021   Acute CVA (cerebrovascular accident) (HCC) 02/15/2021   Gout 02/15/2021   AKI  (acute kidney injury) (HCC) 02/15/2021   Hyperlipidemia with target LDL less than 70 - refuses medications 10/17/2013   Coronary stent restenosis due to progression of disease 05/10/2013   Coronary atherosclerosis of native coronary artery 05/10/2013   Myocardial infarction, anterolateral wall, acute (HCC) 02/03/2012    Hildred Laser PT 05/29/2021, 12:18 PM  [facility] [address] [place], [place], [address] Phone: [phone]   Fax:  [phone]  Name: Philip Parsons MRN: [id number] Date of Birth: 1940/11/20

## 2021-06-03 ENCOUNTER — Ambulatory Visit: Payer: Medicare Other

## 2021-06-03 ENCOUNTER — Other Ambulatory Visit: Payer: Self-pay

## 2021-06-03 DIAGNOSIS — R2689 Other abnormalities of gait and mobility: Secondary | ICD-10-CM

## 2021-06-03 DIAGNOSIS — R2681 Unsteadiness on feet: Secondary | ICD-10-CM | POA: Diagnosis not present

## 2021-06-03 DIAGNOSIS — M6281 Muscle weakness (generalized): Secondary | ICD-10-CM | POA: Diagnosis not present

## 2021-06-03 NOTE — Therapy (Signed)
[facility] [address] [place], [place], [address] Phone: [phone]   Fax:  [phone]  Physical Therapy Treatment  Patient Details  Name: Philip Parsons MRN: [id number] Date of Birth: 04/01/1941 Referring Provider (PT): Joen Laura MD   Encounter Date: 06/03/2021    Past Medical History:  Diagnosis Date   Coronary artery disease    History of intravascular stent placement    No pertinent past medical history     Past Surgical History:  Procedure Laterality Date   CORONARY/GRAFT ACUTE MI REVASCULARIZATION N/A 03/25/2021   Procedure: Coronary/Graft Acute MI Revascularization;  Surgeon: Marykay Lex, MD;  Location: [facility] INVASIVE CV LAB;  Service: Cardiovascular;  Laterality: N/A;   LEFT HEART CATH N/A 02/02/2012   Procedure: LEFT HEART CATH;  Surgeon: Robynn Pane, MD;  Location: [facility] CATH LAB;  Service: Cardiovascular;  Laterality: N/A;   LEFT HEART CATH AND CORONARY ANGIOGRAPHY N/A 03/25/2021   Procedure: LEFT HEART CATH AND CORONARY ANGIOGRAPHY;  Surgeon: Marykay Lex, MD;  Location: [facility] INVASIVE CV LAB;  Service: Cardiovascular;  Laterality: N/A;   LEFT HEART CATHETERIZATION WITH CORONARY ANGIOGRAM N/A 05/10/2013   Procedure: LEFT HEART CATHETERIZATION WITH CORONARY ANGIOGRAM;  Surgeon: Lesleigh Noe, MD;  Location: [facility] CATH LAB;  Service: Cardiovascular;  Laterality: N/A;   NO PAST SURGERIES      There were no vitals filed for this visit.       [facility] PT Assessment - 06/03/21 0001       6 Minute Walk- Baseline   6 Minute Walk- Baseline yes    BP (mmHg) 128/72    HR (bpm) 68    02 Sat (%RA) 98 %      6 Minute walk- Post Test   6 Minute Walk Post Test yes    BP (mmHg) 158/78    HR (bpm) 76    02 Sat (%RA) 98 %      6 minute walk test results    Aerobic Endurance Distance Walked 1450                           [facility] Adult PT Treatment/Exercise - 06/03/21 0001        Transfers   Transfers Sit to Stand;Stand to Sit    Sit to Stand 5: Supervision    Comments 10 reps from airex cued not to lock knees      Ambulation/Gait   Ambulation/Gait Yes    Ambulation/Gait Assistance 5: Supervision    Ambulation Distance (Feet) 1450 Feet    Assistive device None    Gait Pattern Step-through pattern;Decreased step length - left;Poor foot clearance - left    Ambulation Surface Level;Indoor    Gait Comments 6 MWT                 Balance Exercises - 06/03/21 0001       Balance Exercises: Standing   Standing Eyes Opened Narrow base of support (BOS);Head turns;Foam/compliant surface;5 reps;30 secs;Limitations    Standing Eyes Opened Limitations 30s EO, 5 hed turns/nods    Standing Eyes Closed Narrow base of support (BOS);Foam/compliant surface;5 reps;30 secs;Limitations    Standing Eyes Closed Limitations 30s static stand, 5 head turns/nods    Tandem Stance Eyes open;Eyes closed;1 rep;30 secs;Limitations    Tandem Stance Time S only    SLS with Vectors Solid surface;Upper extremity assist 1;Limitations    SLS with Vectors Limitations standing  with single UE support tapping floor pebbles, 2 targets unilateral, 2 targets,  alt. 2 targets cross body unilateral then alt, alt. cross body to single target    Stepping Strategy Anterior;Foam/compliant surface;10 reps;Limitations    Stepping Strategy Limitations from airex taping 4" step with WB, 10x unilateral and 10x alt pre LE             Upper Extremity Functional Index Score :   /80     PT Short Term Goals - 06/03/21 1420       PT SHORT TERM GOAL #1   Title Establish HEP    Baseline Given walking program; 9/2/22LBZA6NN9    Time 2    Period Weeks    Status Achieved    Target Date 06/16/21      PT SHORT TERM GOAL #2   Title Assess 6 MWT and set goals    Baseline TBD; 06/03/21 6 MWT complete    Time 2    Period Weeks    Status New    Target Date 06/16/21      PT SHORT TERM GOAL #3   Title  Patient to negotiate 16 steps with most appropriate pattern    Baseline 4 stairs with B rails and step through pattern    Time 2    Period Weeks    Status New    Target Date 06/16/21               PT Long Term Goals - 06/03/21 1421       PT LONG TERM GOAL #1   Title Pt will be independent with HEP for improved balance and gait.    Baseline walking program    Time 4    Period Weeks    Status New      PT LONG TERM GOAL #2   Title Pt will improve FGA score to at least 27/30 to decrease fall risk.    Baseline Initial FGA 21/30    Time 4    Period Weeks    Status New      PT LONG TERM GOAL #3   Title Improve 5x STS score to 12s    Baseline Initial score 13.8s    Time 4    Period Weeks    Status New      PT LONG TERM GOAL #4   Title Assess 6 MWT and progress towards goal; 06/03/21 Patient to ambulate 1600-1839ft in 6 minutes    Baseline TBD; 06/03/21 6 MWTdistance 1429ft    Time 4    Period Weeks    Status New      PT LONG TERM GOAL #5   Title Increase L knee and ankle strength to 4+/5    Baseline L knee and ankle strength 4/5    Time 4    Period Weeks    Status New                   Plan - 06/03/21 1428     Clinical Impression Statement Todays session addressed , setting baseline and goals, SLS tasks and balance challenges with vision removed and compliant surface usage.  Patient able to repeat stepping tasks to floor targets more accurately with one LOB episode, no LOB noted with all positions standing on airex with balance challenges.  Patient showing some signs of impulsiveness, not recognizing limitations    Personal Factors and Comorbidities Comorbidity 3+    Comorbidities PMH CAD, HTN, gout, hyperlipidemia  Examination-Activity Limitations Locomotion Level;Stairs    Examination-Participation Restrictions Community Activity;Other   Exercise/walking in neighborhood-1-3 miles/day   Stability/Clinical Decision Making Evolving/Moderate complexity     Rehab Potential Good    PT Frequency Other (comment)   1x/wk (including eval), then 2x/wk for 4 weeks   PT Duration Other (comment)   5 weeks total POC   PT Treatment/Interventions ADLs/Self Care Home Management;Gait training;Stair training;Functional mobility training;Therapeutic activities;Therapeutic exercise;Balance training;Neuromuscular re-education;Patient/family education    PT Next Visit Plan SLS tasks and foot placement tasks for accuracy, stair training, outdoor ambulation    PT Home Exercise Plan [email or web address]    Consulted and Agree with Plan of Care Patient;Family member/caregiver    Family Member Consulted daughter             Patient will benefit from skilled therapeutic intervention in order to improve the following deficits and impairments:  Abnormal gait, Decreased coordination, Difficulty walking, Impaired perceived functional ability, Decreased balance  Visit Diagnosis: Unsteadiness on feet  Muscle weakness (generalized)  Other abnormalities of gait and mobility     Problem List Patient Active Problem List   Diagnosis Date Noted   CVA (cerebral vascular accident) (HCC) 05/18/2021   Acute ST elevation myocardial infarction (STEMI) due to occlusion of left anterior descending (LAD) coronary artery (HCC) 03/25/2021   Coronary stent thrombosis 03/25/2021   Acute ST elevation myocardial infarction (STEMI) of anterior wall (HCC) 03/25/2021   Acute CVA (cerebrovascular accident) (HCC) 02/15/2021   Gout 02/15/2021   AKI (acute kidney injury) (HCC) 02/15/2021   Hyperlipidemia with target LDL less than 70 - refuses medications 10/17/2013   Coronary stent restenosis due to progression of disease 05/10/2013   Coronary atherosclerosis of native coronary artery 05/10/2013   Myocardial infarction, anterolateral wall, acute (HCC) 02/03/2012    Hildred Laser, PT 06/03/2021, 2:38 PM  [facility] [address] [place], [place], [address] Phone: [phone]   Fax:  [phone]  Name: Philip Parsons MRN: [id number] Date of Birth: 17-Nov-1940

## 2021-06-04 ENCOUNTER — Ambulatory Visit: Payer: Medicare Other

## 2021-06-04 DIAGNOSIS — R2689 Other abnormalities of gait and mobility: Secondary | ICD-10-CM | POA: Diagnosis not present

## 2021-06-04 DIAGNOSIS — R2681 Unsteadiness on feet: Secondary | ICD-10-CM

## 2021-06-04 DIAGNOSIS — M6281 Muscle weakness (generalized): Secondary | ICD-10-CM

## 2021-06-04 NOTE — Therapy (Signed)
[facility] [address] [place], [place], [address] Phone: [phone]   Fax:  [phone]  Physical Therapy Treatment  Patient Details  Name: Philip Parsons MRN: [id number] Date of Birth: [birth year] Referring Provider (PT): Joen Laura MD   Encounter Date: 06/04/2021   PT End of Session - 06/04/21 1444     Visit Number 4    Number of Visits 9    Date for PT Re-Evaluation 06/30/21    Authorization Type Medicare    Progress Note Due on Visit 10    PT Start Time 1445    PT Stop Time 1530    PT Time Calculation (min) 45 min    Equipment Utilized During Treatment Gait belt    Activity Tolerance Patient tolerated treatment well    Behavior During Therapy [facility] for tasks assessed/performed             Past Medical History:  Diagnosis Date   Coronary artery disease    History of intravascular stent placement    No pertinent past medical history     Past Surgical History:  Procedure Laterality Date   CORONARY/GRAFT ACUTE MI REVASCULARIZATION N/A 03/25/2021   Procedure: Coronary/Graft Acute MI Revascularization;  Surgeon: Marykay Lex, MD;  Location: [facility] INVASIVE CV LAB;  Service: Cardiovascular;  Laterality: N/A;   LEFT HEART CATH N/A 02/02/2012   Procedure: LEFT HEART CATH;  Surgeon: Robynn Pane, MD;  Location: [facility] CATH LAB;  Service: Cardiovascular;  Laterality: N/A;   LEFT HEART CATH AND CORONARY ANGIOGRAPHY N/A 03/25/2021   Procedure: LEFT HEART CATH AND CORONARY ANGIOGRAPHY;  Surgeon: Marykay Lex, MD;  Location: [facility] INVASIVE CV LAB;  Service: Cardiovascular;  Laterality: N/A;   LEFT HEART CATHETERIZATION WITH CORONARY ANGIOGRAM N/A 05/10/2013   Procedure: LEFT HEART CATHETERIZATION WITH CORONARY ANGIOGRAM;  Surgeon: Lesleigh Noe, MD;  Location: [facility] CATH LAB;  Service: Cardiovascular;  Laterality: N/A;   NO PAST SURGERIES      There were no vitals filed for this visit.   Subjective Assessment  - 06/04/21 1449     Subjective Has been walking 15 min 1x/day, CG reports one stumble with stepping off curb, no fall though                               [facility] Adult PT Treatment/Exercise - 06/04/21 0001       Transfers   Transfers Sit to Stand;Stand to Sit    Sit to Stand 5: Supervision;4: Min guard    Comments 10 reps from airex cued not to lock knees      Ambulation/Gait   Ambulation/Gait Yes    Ambulation/Gait Assistance 5: Supervision;4: Min guard    Ambulation Distance (Feet) 345 Feet    Assistive device None    Gait Pattern Step-through pattern;Decreased step length - left;Poor foot clearance - left    Ambulation Surface Level;Indoor    Gait Comments 1 lap with head turns, nodas and directional changes, 1 lap transferrring ball b/t hands and final lap tossing/catching ball                 Balance Exercises - 06/04/21 0001       Balance Exercises: Standing   Rockerboard Anterior/posterior;EO;30 seconds;Limitations    Rockerboard Limitations 30S hold w/o nee dof UE support    Step Ups Forward;Limitations    Step Ups Limitations Step ups onto rocker board  10x per leg with single UE support    Tandem Gait Forward;Retro;5 reps;Limitations    Tandem Gait Limitations in // bars w/o UR support requiring CGA    Sidestepping Upper extremity support;5 reps;Limitations    Sidestepping Limitations braiding performed in // bars wit B UE support and CGA                  PT Short Term Goals - 06/04/21 1545       PT SHORT TERM GOAL #1   Title Establish HEP    Baseline Given walking program; 9/2/22LBZA6NN9    Time 2    Period Weeks    Status Achieved    Target Date 06/16/21      PT SHORT TERM GOAL #2   Title Assess 6 MWT and set goals    Baseline TBD; 06/03/21 6 MWT complete    Time 2    Period Weeks    Status Achieved    Target Date 06/16/21      PT SHORT TERM GOAL #3   Title Patient to negotiate 16 steps with most appropriate  pattern    Baseline 4 stairs with B rails and step through pattern    Time 2    Period Weeks    Status New    Target Date 06/16/21               PT Long Term Goals - 06/03/21 1421       PT LONG TERM GOAL #1   Title Pt will be independent with HEP for improved balance and gait.    Baseline walking program    Time 4    Period Weeks    Status New      PT LONG TERM GOAL #2   Title Pt will improve FGA score to at least 27/30 to decrease fall risk.    Baseline Initial FGA 21/30    Time 4    Period Weeks    Status New      PT LONG TERM GOAL #3   Title Improve 5x STS score to 12s    Baseline Initial score 13.8s    Time 4    Period Weeks    Status New      PT LONG TERM GOAL #4   Title Assess 6 MWT and progress towards goal; 06/03/21 Patient to ambulate 1600-1839ft in 6 minutes    Baseline TBD; 06/03/21 6 MWTdistance 1480ft    Time 4    Period Weeks    Status New      PT LONG TERM GOAL #5   Title Increase L knee and ankle strength to 4+/5    Baseline L knee and ankle strength 4/5    Time 4    Period Weeks    Status New                   Plan - 06/04/21 1539     Clinical Impression Statement todays session focused on stepping tasks with emphasis on foot placement and accuracy.  Used RB to create an unstable surface but patient requires single UE support.  Able to maintain static stand on RB w/o UE support needed.  Continued missteps and impulsive behavior noted with patient reluctant to acknowledge deficits.  Able to complete tasks w/o error when cued to pace and concentrate.  Added dual tasking with ambulation including catching a ball, transfers from hand to hand and directional changes and head turns    Personal  Factors and Comorbidities Comorbidity 3+    Comorbidities PMH CAD, HTN, gout, hyperlipidemia    Examination-Activity Limitations Locomotion Level;Stairs    Examination-Participation Restrictions Community Activity;Other   Exercise/walking in  neighborhood-1-3 miles/day   Stability/Clinical Decision Making Evolving/Moderate complexity    Rehab Potential Good    PT Frequency Other (comment)   1x/wk (including eval), then 2x/wk for 4 weeks   PT Duration Other (comment)   5 weeks total POC   PT Treatment/Interventions ADLs/Self Care Home Management;Gait training;Stair training;Functional mobility training;Therapeutic activities;Therapeutic exercise;Balance training;Neuromuscular re-education;Patient/family education    PT Next Visit Plan SLS tasks and foot placement tasks for accuracy, stair training, outdoor ambulation, balance on compliant surfaces    PT Home Exercise Plan [email or web address]    Consulted and Agree with Plan of Care Patient;Family member/caregiver    Family Member Consulted daughter             Patient will benefit from skilled therapeutic intervention in order to improve the following deficits and impairments:  Abnormal gait, Decreased coordination, Difficulty walking, Impaired perceived functional ability, Decreased balance  Visit Diagnosis: Unsteadiness on feet  Muscle weakness (generalized)  Other abnormalities of gait and mobility     Problem List Patient Active Problem List   Diagnosis Date Noted   CVA (cerebral vascular accident) (HCC) 05/18/2021   Acute ST elevation myocardial infarction (STEMI) due to occlusion of left anterior descending (LAD) coronary artery (HCC) 03/25/2021   Coronary stent thrombosis 03/25/2021   Acute ST elevation myocardial infarction (STEMI) of anterior wall (HCC) 03/25/2021   Acute CVA (cerebrovascular accident) (HCC) 02/15/2021   Gout 02/15/2021   AKI (acute kidney injury) (HCC) 02/15/2021   Hyperlipidemia with target LDL less than 70 - refuses medications 10/17/2013   Coronary stent restenosis due to progression of disease 05/10/2013   Coronary atherosclerosis of native coronary artery 05/10/2013   Myocardial infarction, anterolateral wall, acute (HCC) 02/03/2012     Hildred Laser, PT 06/04/2021, 3:48 PM  [facility] [address] [place], [place], [address] Phone: [phone]   Fax:  [phone]  Name: Philip Parsons MRN: [id number] Date of Birth: 1941-08-23

## 2021-06-09 ENCOUNTER — Telehealth: Payer: Self-pay | Admitting: Adult Health

## 2021-06-09 NOTE — Telephone Encounter (Signed)
HFU cancelled due to NP out- LVM/mychart msg sent.

## 2021-06-10 ENCOUNTER — Ambulatory Visit: Payer: Medicare Other

## 2021-06-10 ENCOUNTER — Inpatient Hospital Stay: Payer: Medicare Other | Admitting: Adult Health

## 2021-06-10 ENCOUNTER — Other Ambulatory Visit: Payer: Self-pay

## 2021-06-10 DIAGNOSIS — R2689 Other abnormalities of gait and mobility: Secondary | ICD-10-CM | POA: Diagnosis not present

## 2021-06-10 DIAGNOSIS — M6281 Muscle weakness (generalized): Secondary | ICD-10-CM

## 2021-06-10 DIAGNOSIS — R2681 Unsteadiness on feet: Secondary | ICD-10-CM

## 2021-06-10 NOTE — Therapy (Signed)
[facility] [address] [place], [place], [address] Phone: [phone]   Fax:  [phone]  Physical Therapy Treatment  Patient Details  Name: Philip Parsons MRN: [id number] Date of Birth: [birth year] Referring Provider (PT): Joen Laura MD   Encounter Date: 06/10/2021   PT End of Session - 06/10/21 1623     Visit Number 5    Number of Visits 9    Date for PT Re-Evaluation 06/30/21    Authorization Type Medicare    Authorization Time Period 8/29-10/41530    Progress Note Due on Visit 10    PT Start Time 1530    PT Stop Time 1615    PT Time Calculation (min) 45 min    Equipment Utilized During Treatment Gait belt    Activity Tolerance Patient tolerated treatment well    Behavior During Therapy [facility] for tasks assessed/performed             Past Medical History:  Diagnosis Date   Coronary artery disease    History of intravascular stent placement    No pertinent past medical history     Past Surgical History:  Procedure Laterality Date   CORONARY/GRAFT ACUTE MI REVASCULARIZATION N/A 03/25/2021   Procedure: Coronary/Graft Acute MI Revascularization;  Surgeon: Marykay Lex, MD;  Location: [facility] INVASIVE CV LAB;  Service: Cardiovascular;  Laterality: N/A;   LEFT HEART CATH N/A 02/02/2012   Procedure: LEFT HEART CATH;  Surgeon: Robynn Pane, MD;  Location: [facility] CATH LAB;  Service: Cardiovascular;  Laterality: N/A;   LEFT HEART CATH AND CORONARY ANGIOGRAPHY N/A 03/25/2021   Procedure: LEFT HEART CATH AND CORONARY ANGIOGRAPHY;  Surgeon: Marykay Lex, MD;  Location: [facility] INVASIVE CV LAB;  Service: Cardiovascular;  Laterality: N/A;   LEFT HEART CATHETERIZATION WITH CORONARY ANGIOGRAM N/A 05/10/2013   Procedure: LEFT HEART CATHETERIZATION WITH CORONARY ANGIOGRAM;  Surgeon: Lesleigh Noe, MD;  Location: [facility] CATH LAB;  Service: Cardiovascular;  Laterality: N/A;   NO PAST SURGERIES      There were no vitals  filed for this visit.   Subjective Assessment - 06/10/21 1537     Subjective Has increased ambulation to BID    Patient is accompained by: Family member   daughter   Pertinent History PMH CAD, HTN, gout, hyperlipidemia    Limitations Standing;Walking    How long can you sit comfortably? unlimited    How long can you stand comfortably? <15 min    How long can you walk comfortably? <15 min    Patient Stated Goals Improve LLE strength and gait    Currently in Pain? No/denies                               [facility] Adult PT Treatment/Exercise - 06/10/21 0001       Transfers   Transfers Sit to Stand;Stand to Sit    Sit to Stand 5: Supervision;4: Min guard    Comments 10 reps from airex cued not to lock knees followed by standing and raising 2kg ball OH to facilitate extension 10x      Ambulation/Gait   Ambulation/Gait Yes    Ambulation/Gait Assistance 5: Supervision;4: Min guard      Knee/Hip Exercises: Aerobic   Other Aerobic Scifit seat 18 arms 7 L2 x8'                 Balance Exercises - 06/10/21 0001  Balance Exercises: Standing   SLS with Vectors Upper extremity assist 1;Limitations;Foam/compliant surface    SLS with Vectors Limitations standing with single UE support tapping floor pebbles, 2 targets unilateral, 2 targets,  alt. 2 targets cross body unilateral then alt, alt. cross body to single target    Retro Gait Foam/compliant surface;5 reps;Limitations    Retro Gait Limitations fowrward and backward 5 trips in // bars w/o UE support    Sidestepping Foam/compliant support;5 reps;Limitations    Sidestepping Limitations across blue mat no UE support    Other Standing Exercises negotiating blue mat in // bars retrieving objects from mat, carrying to opposite side and returning them on the return trip, followed by retrieving/placing w/o carrying 5 trips                  PT Short Term Goals - 06/04/21 1545       PT SHORT TERM  GOAL #1   Title Establish HEP    Baseline Given walking program; 9/2/22LBZA6NN9    Time 2    Period Weeks    Status Achieved    Target Date 06/16/21      PT SHORT TERM GOAL #2   Title Assess 6 MWT and set goals    Baseline TBD; 06/03/21 6 MWT complete    Time 2    Period Weeks    Status Achieved    Target Date 06/16/21      PT SHORT TERM GOAL #3   Title Patient to negotiate 16 steps with most appropriate pattern    Baseline 4 stairs with B rails and step through pattern    Time 2    Period Weeks    Status New    Target Date 06/16/21               PT Long Term Goals - 06/03/21 1421       PT LONG TERM GOAL #1   Title Pt will be independent with HEP for improved balance and gait.    Baseline walking program    Time 4    Period Weeks    Status New      PT LONG TERM GOAL #2   Title Pt will improve FGA score to at least 27/30 to decrease fall risk.    Baseline Initial FGA 21/30    Time 4    Period Weeks    Status New      PT LONG TERM GOAL #3   Title Improve 5x STS score to 12s    Baseline Initial score 13.8s    Time 4    Period Weeks    Status New      PT LONG TERM GOAL #4   Title Assess 6 MWT and progress towards goal; 06/03/21 Patient to ambulate 1600-1864ft in 6 minutes    Baseline TBD; 06/03/21 6 MWTdistance 1443ft    Time 4    Period Weeks    Status New      PT LONG TERM GOAL #5   Title Increase L knee and ankle strength to 4+/5    Baseline L knee and ankle strength 4/5    Time 4    Period Weeks    Status New                   Plan - 06/10/21 1625     Clinical Impression Statement During todays session we were able to advance challenges on balance tasks by adding compliant surfaces to  advanced gait training.  Transfer training challeged by holding weight and raising OH w/o posterior knees,  Incorporated reaching and carrying tasks into compliant surface negotiation.  Still showing signs of impulsive behavior requiring cues to pace and avoid  toe grab when transitioning b/t surfaces    Personal Factors and Comorbidities Comorbidity 3+    Comorbidities PMH CAD, HTN, gout, hyperlipidemia    Examination-Activity Limitations Locomotion Level;Stairs    Examination-Participation Restrictions Community Activity;Other   Exercise/walking in neighborhood-1-3 miles/day   Stability/Clinical Decision Making Evolving/Moderate complexity    Rehab Potential Good    PT Frequency Other (comment)   1x/wk (including eval), then 2x/wk for 4 weeks   PT Duration Other (comment)   5 weeks total POC   PT Treatment/Interventions ADLs/Self Care Home Management;Gait training;Stair training;Functional mobility training;Therapeutic activities;Therapeutic exercise;Balance training;Neuromuscular re-education;Patient/family education    PT Next Visit Plan runners step, stair training, outdoor ambulation, balance on compliant surfaces with dual tasking    PT Home Exercise Plan [email or web address]    Consulted and Agree with Plan of Care Patient;Family member/caregiver    Family Member Consulted daughter             Patient will benefit from skilled therapeutic intervention in order to improve the following deficits and impairments:  Abnormal gait, Decreased coordination, Difficulty walking, Impaired perceived functional ability, Decreased balance  Visit Diagnosis: Unsteadiness on feet  Other abnormalities of gait and mobility  Muscle weakness (generalized)     Problem List Patient Active Problem List   Diagnosis Date Noted   CVA (cerebral vascular accident) (HCC) 05/18/2021   Acute ST elevation myocardial infarction (STEMI) due to occlusion of left anterior descending (LAD) coronary artery (HCC) 03/25/2021   Coronary stent thrombosis 03/25/2021   Acute ST elevation myocardial infarction (STEMI) of anterior wall (HCC) 03/25/2021   Acute CVA (cerebrovascular accident) (HCC) 02/15/2021   Gout 02/15/2021   AKI (acute kidney injury) (HCC) 02/15/2021    Hyperlipidemia with target LDL less than 70 - refuses medications 10/17/2013   Coronary stent restenosis due to progression of disease 05/10/2013   Coronary atherosclerosis of native coronary artery 05/10/2013   Myocardial infarction, anterolateral wall, acute (HCC) 02/03/2012    Hildred Laser, PT 06/10/2021, 4:35 PM  [facility] [address] [place], [place], [address] Phone: [phone]   Fax:  [phone]  Name: MERWIN BREDEN MRN: [id number] Date of Birth: [birth year]

## 2021-06-12 ENCOUNTER — Other Ambulatory Visit: Payer: Self-pay

## 2021-06-12 ENCOUNTER — Ambulatory Visit: Payer: Medicare Other

## 2021-06-12 DIAGNOSIS — M6281 Muscle weakness (generalized): Secondary | ICD-10-CM

## 2021-06-12 DIAGNOSIS — R2689 Other abnormalities of gait and mobility: Secondary | ICD-10-CM

## 2021-06-12 DIAGNOSIS — R2681 Unsteadiness on feet: Secondary | ICD-10-CM | POA: Diagnosis not present

## 2021-06-12 NOTE — Therapy (Signed)
[facility] [address] [place], [place], [address] Phone: [phone]   Fax:  [phone]  Physical Therapy Treatment  Patient Details  Name: Philip Parsons MRN: [id number] Date of Birth: Mar 11, 1941 Referring Provider (PT): Joen Laura MD   Encounter Date: 06/12/2021   PT End of Session - 06/12/21 1412     Visit Number 6    Number of Visits 9    Date for PT Re-Evaluation 06/30/21    Authorization Type Medicare    Authorization Time Period 8/29-10/41530    Progress Note Due on Visit 10    PT Start Time 1400    PT Stop Time 1445    PT Time Calculation (min) 45 min    Equipment Utilized During Treatment Gait belt    Activity Tolerance Patient tolerated treatment well    Behavior During Therapy [facility] for tasks assessed/performed             Past Medical History:  Diagnosis Date   Coronary artery disease    History of intravascular stent placement    No pertinent past medical history     Past Surgical History:  Procedure Laterality Date   CORONARY/GRAFT ACUTE MI REVASCULARIZATION N/A 03/25/2021   Procedure: Coronary/Graft Acute MI Revascularization;  Surgeon: Marykay Lex, MD;  Location: [facility] INVASIVE CV LAB;  Service: Cardiovascular;  Laterality: N/A;   LEFT HEART CATH N/A 02/02/2012   Procedure: LEFT HEART CATH;  Surgeon: Robynn Pane, MD;  Location: [facility] CATH LAB;  Service: Cardiovascular;  Laterality: N/A;   LEFT HEART CATH AND CORONARY ANGIOGRAPHY N/A 03/25/2021   Procedure: LEFT HEART CATH AND CORONARY ANGIOGRAPHY;  Surgeon: Marykay Lex, MD;  Location: [facility] INVASIVE CV LAB;  Service: Cardiovascular;  Laterality: N/A;   LEFT HEART CATHETERIZATION WITH CORONARY ANGIOGRAM N/A 05/10/2013   Procedure: LEFT HEART CATHETERIZATION WITH CORONARY ANGIOGRAM;  Surgeon: Lesleigh Noe, MD;  Location: [facility] CATH LAB;  Service: Cardiovascular;  Laterality: N/A;   NO PAST SURGERIES      There were no vitals  filed for this visit.   Subjective Assessment - 06/12/21 1403     Subjective No changes to note, continues ambulation 20 min BID    Patient is accompained by: Family member   daughter   Pertinent History PMH CAD, HTN, gout, hyperlipidemia    Limitations Standing;Walking    How long can you sit comfortably? unlimited    How long can you stand comfortably? <15 min    How long can you walk comfortably? <15 min    Patient Stated Goals Improve LLE strength and gait                               [facility] Adult PT Treatment/Exercise - 06/12/21 0001       Transfers   Transfers Sit to Stand;Stand to Sit    Sit to Stand 5: Supervision      Ambulation/Gait   Ambulation/Gait Yes    Ambulation/Gait Assistance 5: Supervision    Ambulation Distance (Feet) 1000 Feet    Assistive device None    Gait Pattern Step-through pattern;Decreased step length - left;Poor foot clearance - left    Ambulation Surface Level;Indoor;Unlevel;Outdoor;Paved;Grass    Gait Comments dual tasking of naming foods alphabetically                 Balance Exercises - 06/12/21 0001  Balance Exercises: Standing   Standing Eyes Closed Narrow base of support (BOS);Foam/compliant surface;5 reps;30 secs    Standing Eyes Closed Limitations 30 second stati hold, 5 head turns nods    Tandem Stance Eyes closed;Foam/compliant surface;5 reps;30 secs    Tandem Stance Time staic stance 30s , 5 hea turns/nods    SLS with Vectors Foam/compliant surface;Limitations    SLS with Vectors Limitations standing with single UE support tapping cones , 2 targets cones unilateral, 2 targets,  alt. 2 targets cross body unilateral then alt, alt. cross body to single target    Other Standing Exercises runners step from airex pad 10x ea. alt                  PT Short Term Goals - 06/04/21 1545       PT SHORT TERM GOAL #1   Title Establish HEP    Baseline Given walking program; 9/2/22LBZA6NN9    Time 2     Period Weeks    Status Achieved    Target Date 06/16/21      PT SHORT TERM GOAL #2   Title Assess 6 MWT and set goals    Baseline TBD; 06/03/21 6 MWT complete    Time 2    Period Weeks    Status Achieved    Target Date 06/16/21      PT SHORT TERM GOAL #3   Title Patient to negotiate 16 steps with most appropriate pattern    Baseline 4 stairs with B rails and step through pattern    Time 2    Period Weeks    Status New    Target Date 06/16/21               PT Long Term Goals - 06/03/21 1421       PT LONG TERM GOAL #1   Title Pt will be independent with HEP for improved balance and gait.    Baseline walking program    Time 4    Period Weeks    Status New      PT LONG TERM GOAL #2   Title Pt will improve FGA score to at least 27/30 to decrease fall risk.    Baseline Initial FGA 21/30    Time 4    Period Weeks    Status New      PT LONG TERM GOAL #3   Title Improve 5x STS score to 12s    Baseline Initial score 13.8s    Time 4    Period Weeks    Status New      PT LONG TERM GOAL #4   Title Assess 6 MWT and progress towards goal; 06/03/21 Patient to ambulate 1600-1855ft in 6 minutes    Baseline TBD; 06/03/21 6 MWTdistance 1450ft    Time 4    Period Weeks    Status New      PT LONG TERM GOAL #5   Title Increase L knee and ankle strength to 4+/5    Baseline L knee and ankle strength 4/5    Time 4    Period Weeks    Status New                   Plan - 06/12/21 1442     Clinical Impression Statement Todays session continued with balance training and SLS tasks to challenge balance and step accuracy.  Advanced difficulty on stepping tasks by adding cones which are more unstable, incurring 6  missteps.  Occasional UE support needed to maintain balance.  Able to perform dual tasking with outside ambulation with naming of foods alphabetically.  Performed indoor ambulation with directional changes every 10 steps w/o LOB including 180d pivot turns randomly.   Performance improves wit pacing.    Personal Factors and Comorbidities Comorbidity 3+    Comorbidities PMH CAD, HTN, gout, hyperlipidemia    Examination-Activity Limitations Locomotion Level;Stairs    Examination-Participation Restrictions Community Activity;Other   Exercise/walking in neighborhood-1-3 miles/day   Stability/Clinical Decision Making Evolving/Moderate complexity    Rehab Potential Good    PT Frequency Other (comment)   1x/wk (including eval), then 2x/wk for 4 weeks   PT Duration Other (comment)   5 weeks total POC   PT Treatment/Interventions ADLs/Self Care Home Management;Gait training;Stair training;Functional mobility training;Therapeutic activities;Therapeutic exercise;Balance training;Neuromuscular re-education;Patient/family education    PT Next Visit Plan balance on compliant surfaces with dual tasking, dynamic balance challenges    PT Home Exercise Plan [email or web address]    Consulted and Agree with Plan of Care Patient;Family member/caregiver    Family Member Consulted daughter             Patient will benefit from skilled therapeutic intervention in order to improve the following deficits and impairments:  Abnormal gait, Decreased coordination, Difficulty walking, Impaired perceived functional ability, Decreased balance  Visit Diagnosis: Unsteadiness on feet  Other abnormalities of gait and mobility  Muscle weakness (generalized)     Problem List Patient Active Problem List   Diagnosis Date Noted   CVA (cerebral vascular accident) (HCC) 05/18/2021   Acute ST elevation myocardial infarction (STEMI) due to occlusion of left anterior descending (LAD) coronary artery (HCC) 03/25/2021   Coronary stent thrombosis 03/25/2021   Acute ST elevation myocardial infarction (STEMI) of anterior wall (HCC) 03/25/2021   Acute CVA (cerebrovascular accident) (HCC) 02/15/2021   Gout 02/15/2021   AKI (acute kidney injury) (HCC) 02/15/2021   Hyperlipidemia with target LDL less  than 70 - refuses medications 10/17/2013   Coronary stent restenosis due to progression of disease 05/10/2013   Coronary atherosclerosis of native coronary artery 05/10/2013   Myocardial infarction, anterolateral wall, acute (HCC) 02/03/2012    Hildred Laser, PT 06/12/2021, 4:24 PM  [facility] [address] [place], [place], [address] Phone: [phone]   Fax:  [phone]  Name: Philip Parsons MRN: [id number] Date of Birth: May 13, 1941

## 2021-06-15 ENCOUNTER — Telehealth: Payer: Self-pay | Admitting: Adult Health

## 2021-06-15 DIAGNOSIS — G3184 Mild cognitive impairment, so stated: Secondary | ICD-10-CM

## 2021-06-15 NOTE — Telephone Encounter (Signed)
-----   Message from Hildred Laser, PT sent at 06/10/2021  4:56 PM EDT ----- Lance Bosch,  I have been seeing Mr Philip Parsons and feel he would benefit from a Speech Therapy referral for problem solving and memory issues if you feel it is appropriate.  You will not see him until the end of October.    Thank you,  Learta Codding PT

## 2021-06-17 ENCOUNTER — Ambulatory Visit: Payer: Medicare Other

## 2021-06-19 ENCOUNTER — Ambulatory Visit: Payer: Medicare Other

## 2021-06-19 ENCOUNTER — Other Ambulatory Visit: Payer: Self-pay

## 2021-06-19 DIAGNOSIS — M6281 Muscle weakness (generalized): Secondary | ICD-10-CM | POA: Diagnosis not present

## 2021-06-19 DIAGNOSIS — R2681 Unsteadiness on feet: Secondary | ICD-10-CM

## 2021-06-19 DIAGNOSIS — R2689 Other abnormalities of gait and mobility: Secondary | ICD-10-CM | POA: Diagnosis not present

## 2021-06-19 NOTE — Therapy (Signed)
[facility] [address] [place], [place], [address] Phone: [phone]   Fax:  [phone]  Physical Therapy Treatment/DC Summary  Patient Details  Name: Philip Parsons MRN: [id number] Date of Birth: 1941/05/06 Referring Provider (PT): Myrene Buddy MD   Encounter Date: 06/19/2021   Visits from Start of Care: 7  Current functional level related to goals / functional outcomes: All rehab goals met   Remaining deficits: None noted   Education / Equipment: HEP for walking   Patient agrees to discharge. Patient goals were met. Patient is being discharged due to being pleased with the current functional level.    PT End of Session - 06/19/21 1520     Visit Number 7    Number of Visits 9    Date for PT Re-Evaluation 06/30/21    Authorization Type Medicare    Authorization Time Period 8/29-10/41530    Progress Note Due on Visit 10    PT Start Time 1445    PT Stop Time 1520    PT Time Calculation (min) 35 min    Equipment Utilized During Treatment Gait belt    Activity Tolerance Patient tolerated treatment well    Behavior During Therapy WFL for tasks assessed/performed             Past Medical History:  Diagnosis Date   Coronary artery disease    History of intravascular stent placement    No pertinent past medical history     Past Surgical History:  Procedure Laterality Date   CORONARY/GRAFT ACUTE MI REVASCULARIZATION N/A 03/25/2021   Procedure: Coronary/Graft Acute MI Revascularization;  Surgeon: Leonie Man, MD;  Location: [facility];  Service: Cardiovascular;  Laterality: N/A;   LEFT HEART CATH N/A 02/02/2012   Procedure: LEFT HEART CATH;  Surgeon: Clent Demark, MD;  Location: [facility] CATH LAB;  Service: Cardiovascular;  Laterality: N/A;   LEFT HEART CATH AND CORONARY ANGIOGRAPHY N/A 03/25/2021   Procedure: LEFT HEART CATH AND CORONARY ANGIOGRAPHY;  Surgeon: Leonie Man, MD;   Location: [facility];  Service: Cardiovascular;  Laterality: N/A;   LEFT HEART CATHETERIZATION WITH CORONARY ANGIOGRAM N/A 05/10/2013   Procedure: LEFT HEART CATHETERIZATION WITH CORONARY ANGIOGRAM;  Surgeon: Sinclair Grooms, MD;  Location: [facility] CATH LAB;  Service: Cardiovascular;  Laterality: N/A;   NO PAST SURGERIES      There were no vitals filed for this visit.   Subjective Assessment - 06/19/21 1438     Subjective No changes to note, continues ambulation 20 min BID, rates himself at 95%    Patient is accompained by: Family member   daughter   Pertinent History PMH CAD, HTN, gout, hyperlipidemia    Limitations Standing;Walking    How long can you sit comfortably? unlimited    How long can you stand comfortably? <15 min    How long can you walk comfortably? <15 min    Patient Stated Goals Improve LLE strength and gait    Currently in Pain? No/denies                [facility] PT Assessment - 06/19/21 0001       6 minute walk test results    Aerobic Endurance Distance Walked 1700    Endurance additional comments no AD used      Functional Gait  Assessment   Gait assessed  Yes    Gait Level Surface Walks 20 ft in less than 5.5 sec,  no assistive devices, good speed, no evidence for imbalance, normal gait pattern, deviates no more than 6 in outside of the 12 in walkway width.    Change in Gait Speed Able to smoothly change walking speed without loss of balance or gait deviation. Deviate no more than 6 in outside of the 12 in walkway width.    Gait with Horizontal Head Turns Performs head turns smoothly with no change in gait. Deviates no more than 6 in outside 12 in walkway width    Gait with Vertical Head Turns Performs head turns with no change in gait. Deviates no more than 6 in outside 12 in walkway width.    Gait and Pivot Turn Pivot turns safely within 3 sec and stops quickly with no loss of balance.    Step Over Obstacle Is able to step over 2 stacked shoe boxes taped  together (9 in total height) without changing gait speed. No evidence of imbalance.    Gait with Narrow Base of Support Is able to ambulate for 10 steps heel to toe with no staggering.    Gait with Eyes Closed Walks 20 ft, no assistive devices, good speed, no evidence of imbalance, normal gait pattern, deviates no more than 6 in outside 12 in walkway width. Ambulates 20 ft in less than 7 sec.    Ambulating Backwards Walks 20 ft, no assistive devices, good speed, no evidence for imbalance, normal gait    Steps Alternating feet, must use rail.    Total Score 29                           [facility] Adult PT Treatment/Exercise - 06/19/21 0001       Transfers   Transfers Sit to Stand;Stand to Sit    Sit to Stand 6: Modified independent (Device/Increase time)    Five time sit to stand comments  8.9s                       PT Short Term Goals - 06/19/21 1521       PT SHORT TERM GOAL #1   Title Establish HEP    Baseline Given walking program; 9/2/[id number]    Time 2    Period Weeks    Status Achieved    Target Date 06/16/21      PT SHORT TERM GOAL #2   Title Assess 6 MWT and set goals    Baseline TBD; 06/03/21 6 MWT complete    Time 2    Period Weeks    Status Achieved    Target Date 06/16/21      PT SHORT TERM GOAL #3   Title Patient to negotiate 16 steps with most appropriate pattern    Baseline 4 stairs with B rails and step through pattern; 06/19/21 16 steps w/o need of rails with alternating pattern    Time 2    Period Weeks    Status Achieved    Target Date 06/16/21               PT Long Term Goals - 06/19/21 1522       PT LONG TERM GOAL #1   Title Pt will be independent with HEP for improved balance and gait.    Baseline walking program    Time 4    Period Weeks    Status Achieved      PT LONG TERM GOAL #2   Title  Pt will improve FGA score to at least 27/30 to decrease fall risk.    Baseline Initial FGA 21/30; 06/19/21 29/30    Time  4    Period Weeks    Status Achieved      PT LONG TERM GOAL #3   Title Improve 5x STS score to 12s    Baseline Initial score 13.8s; 06/19/21 8.9s    Time 4    Period Weeks    Status Achieved      PT LONG TERM GOAL #4   Title Assess 6 MWT and progress towards goal; 06/03/21 Patient to ambulate [id number] in 6 minutes    Baseline TBD; 06/03/21 6 MWTdistance [id number] 06/19/21 6 MWT covering [id number]  Time 4    Period Weeks    Status Achieved      PT LONG TERM GOAL #5   Title Increase L knee and ankle strength to 4+/5    Baseline L knee and ankle strength 4/5; 06/19/21 4+/5 strength throughout L hip flex, abd, knee ext and DF    Time 4    Period Weeks    Status Achieved                   Plan - 06/19/21 1520     Clinical Impression Statement Todays session assessed all goals with patient exceeding all targets.  Mild impulsivity noted but no LOB elicited    Personal Factors and Comorbidities Comorbidity 3+    Comorbidities PMH CAD, HTN, gout, hyperlipidemia    Examination-Activity Limitations Locomotion Level;Stairs    Examination-Participation Restrictions Community Activity;Other   Exercise/walking in neighborhood-1-3 miles/day   Stability/Clinical Decision Making Evolving/Moderate complexity    Rehab Potential Good    PT Frequency Other (comment)   1x/wk (including eval), then 2x/wk for 4 weeks   PT Duration Other (comment)   5 weeks total POC   PT Treatment/Interventions ADLs/Self Care Home Management;Gait training;Stair training;Functional mobility training;Therapeutic activities;Therapeutic exercise;Balance training;Neuromuscular re-education;Patient/family education    PT Next [facility] [place] [facility] [place] Agree with Plan of Care Patient;Family member/caregiver    Family Member Consulted daughter             Patient will benefit from skilled therapeutic intervention in order to improve the following deficits and  impairments:  Abnormal gait, Decreased coordination, Difficulty walking, Impaired perceived functional ability, Decreased balance  Visit Diagnosis: Unsteadiness on feet  Other abnormalities of gait and mobility  Muscle weakness (generalized)     Problem List Patient Active Problem List   Diagnosis Date Noted   CVA (cerebral vascular accident) ([place]CC[place]8/22/2022   Acute ST elevation myocardial infarction (STEMI) due to occlusion of left anterior descending (LAD) coronary artery ([place]CC[place]6/29/2022   Coronary stent thrombosis 03/25/2021   Acute ST elevation myocardial infarction (STEMI) of anterior wall (HCC[place]6/29/2022   Acute CVA (cerebrovascular accident) ([facility]CC[place]5/22/2022   Gout 02/15/2021   AKI (acute kidney injury) ([place]CC[place]5/22/2022   Hyperlipidemia with target LDL less than 70 - refuses medications 10/17/2013   Coronary stent restenosis due to progression of disease 05/10/2013   Coronary atherosclerosis of native coronary artery 05/10/2013   Myocardial infarction, anterolateral wall, acute (HCCLyncourt5/05/2012    Philip ShirtsT 06/19/2021, 3:28 PM  [address]one: [phone]:  [phone]: Philip ADAMCZAKN: [id number]  of Birth: 1940-11-24

## 2021-06-22 ENCOUNTER — Ambulatory Visit: Payer: Medicare Other

## 2021-07-01 DIAGNOSIS — H4052X3 Glaucoma secondary to other eye disorders, left eye, severe stage: Secondary | ICD-10-CM | POA: Diagnosis not present

## 2021-07-06 DIAGNOSIS — I219 Acute myocardial infarction, unspecified: Secondary | ICD-10-CM | POA: Diagnosis not present

## 2021-07-06 DIAGNOSIS — Z7689 Persons encountering health services in other specified circumstances: Secondary | ICD-10-CM | POA: Diagnosis not present

## 2021-07-06 DIAGNOSIS — I639 Cerebral infarction, unspecified: Secondary | ICD-10-CM | POA: Diagnosis not present

## 2021-07-06 DIAGNOSIS — N401 Enlarged prostate with lower urinary tract symptoms: Secondary | ICD-10-CM | POA: Diagnosis not present

## 2021-07-06 DIAGNOSIS — R350 Frequency of micturition: Secondary | ICD-10-CM | POA: Diagnosis not present

## 2021-07-06 DIAGNOSIS — E785 Hyperlipidemia, unspecified: Secondary | ICD-10-CM | POA: Diagnosis not present

## 2021-07-08 DIAGNOSIS — H34812 Central retinal vein occlusion, left eye, with macular edema: Secondary | ICD-10-CM | POA: Diagnosis not present

## 2021-07-08 DIAGNOSIS — H35031 Hypertensive retinopathy, right eye: Secondary | ICD-10-CM | POA: Diagnosis not present

## 2021-07-08 DIAGNOSIS — H4052X3 Glaucoma secondary to other eye disorders, left eye, severe stage: Secondary | ICD-10-CM | POA: Diagnosis not present

## 2021-07-17 ENCOUNTER — Ambulatory Visit: Payer: Medicare Other | Admitting: Cardiology

## 2021-07-17 NOTE — Progress Notes (Deleted)
Cardiology Office Note   Date:  07/17/2021   ID:  Philip Parsons, MRN [id number]  PCP:  Blair Heys, MD  Cardiologist:  Dr. Katrinka Blazing     No chief complaint on file.     History of Present Illness: Philip Parsons is a 80 y.o. male who presents for ***   hx of CAD with stent to LAD in 2013.  , hypertension, hyperlipidemia, in May 2022 he had acute right thalamic infarct.  He underwent neurology work-up with recommendation for aspirin and Plavix for 3 weeks and aspirin alone.  He was recommended for home health PT.  Echocardiogram during that admission with LVEF 65-70% with grade 1 diastolic dysfunction.   03/25/21 pt had STEMI with ST elevation in ant lat leads, and had stenosis in previous stent.  Underwent PTCA.  Also found to have  Dist Cx lesion is 60% stenosed with 60% stenosed side branch in 3rd Mrg.   Echo 03/26/21 LVEF 50-55%, RWMA, mild asymmetric LVH of basal septal segment, gr1DD, trivial MR.  Was stable on post hospital visit.   In 04/2021 pt admitted with acute CVA. Carotids patent. Continued ASA and Brilinta.  No a fib.  Central retinal artery occlusion Left eye pain lipids then with LDL of 65.    TOday***  Past Medical History:  Diagnosis Date   Coronary artery disease    History of intravascular stent placement    No pertinent past medical history     Past Surgical History:  Procedure Laterality Date   CORONARY/GRAFT ACUTE MI REVASCULARIZATION N/A 03/25/2021   Procedure: Coronary/Graft Acute MI Revascularization;  Surgeon: Marykay Lex, MD;  [facility]: [facility] INVASIVE CV LAB;  Service: Cardiovascular;  Laterality: N/A;   LEFT HEART CATH N/A 02/02/2012   Procedure: LEFT HEART CATH;  Surgeon: Robynn Pane, MD;  Location: [facility] CATH LAB;  Service: Cardiovascular;  Laterality: N/A;   LEFT HEART CATH AND CORONARY ANGIOGRAPHY N/A 03/25/2021   Procedure: LEFT HEART CATH AND CORONARY ANGIOGRAPHY;  Surgeon: Marykay Lex, MD;  Location: [facility] INVASIVE CV LAB;   Service: Cardiovascular;  Laterality: N/A;   LEFT HEART CATHETERIZATION WITH CORONARY ANGIOGRAM N/A 05/10/2013   Procedure: LEFT HEART CATHETERIZATION WITH CORONARY ANGIOGRAM;  Surgeon: Lesleigh Noe, MD;  Location: [facility] CATH LAB;  Service: Cardiovascular;  Laterality: N/A;   NO PAST SURGERIES       Current Outpatient Medications  Medication Sig Dispense Refill   acetaminophen (TYLENOL) 500 MG tablet Take 1 tablet (500 mg total) by mouth every 4 (four) hours as needed for mild pain (or temp > 37.5 C (99.5 F)).     Ascorbic Acid (VITAMIN C PO) Take 500 mg by mouth daily.     aspirin EC 81 MG tablet Take 1 tablet (81 mg total) by mouth daily. Swallow whole. 90 tablet 3   atorvastatin (LIPITOR) 40 MG tablet Take 1 tablet (40 mg total) by mouth daily. 90 tablet 3   bimatoprost (LUMIGAN) 0.03 % ophthalmic solution 1 drop at bedtime.     Cholecalciferol (VITAMIN D3) 50 MCG (2000 UT) CAPS Take 2,000 Units by mouth daily.     Coenzyme Q10 (COQ10) 100 MG CAPS Take 100 mg by mouth daily.     latanoprost (XALATAN) 0.005 % ophthalmic solution Place 1 drop into the left eye at bedtime.     MAGNESIUM PO Take 250 mg by mouth daily.     nitroGLYCERIN (NITROSTAT) 0.4 MG SL tablet Place 1 tablet (0.4  mg total) under the tongue every 5 (five) minutes as needed for chest pain. 25 tablet 2   prednisoLONE (PRELONE) 15 MG/5ML syrup Take 1 mg/kg by mouth daily.     ticagrelor (BRILINTA) 90 MG TABS tablet Take 1 tablet (90 mg total) by mouth 2 (two) times daily. 180 tablet 3   vitamin E 180 MG (400 UNITS) capsule Take 400 Units by mouth daily.     No current facility-administered medications for this visit.    Allergies:   Meloxicam    Social History:  The patient  reports that he has quit smoking. He has never used smokeless tobacco. He reports that he does not drink alcohol and does not use drugs.   Family History:  The patient's ***family history includes Arthritis in his mother; Healthy in his father;  Heart disease in his sister.    ROS:  General:no colds or fevers, no weight changes Skin:no rashes or ulcers HEENT:no blurred vision, no congestion CV:see HPI PUL:see HPI GI:no diarrhea constipation or melena, no indigestion GU:no hematuria, no dysuria MS:no joint pain, no claudication Neuro:no syncope, no lightheadedness Endo:no diabetes, no thyroid disease Wt Readings from Last 3 Encounters:  05/19/21 149 lb 0.5 oz (67.6 kg)  05/16/21 154 lb 5.2 oz (70 kg)  04/10/21 151 lb (68.5 kg)     PHYSICAL EXAM: VS:  There were no vitals taken for this visit. , BMI There is no height or weight on file to calculate BMI. General:Pleasant affect, NAD Skin:Warm and dry, brisk capillary refill HEENT:normocephalic, sclera clear, mucus membranes moist Neck:supple, no JVD, no bruits  Heart:S1S2 RRR without murmur, gallup, rub or click Lungs:clear without rales, rhonchi, or wheezes ZOX:WRUE, non tender, + BS, do not palpate liver spleen or masses Ext:no lower ext edema, 2+ pedal pulses, 2+ radial pulses Neuro:alert and oriented, MAE, follows commands, + facial symmetry    EKG:  EKG is ordered today. The ekg ordered today demonstrates ***   Recent Labs: 05/18/2021: ALT 18; BUN 22; Creatinine, Ser 1.29; Hemoglobin 11.8; Platelets 269; Potassium 3.5; Sodium 141    Lipid Panel    Component Value Date/Time   CHOL 131 05/19/2021 0306   CHOL 235 (H) 07/30/2019 0826   TRIG 83 05/19/2021 0306   HDL 49 05/19/2021 0306   HDL 59 07/30/2019 0826   CHOLHDL 2.7 05/19/2021 0306   VLDL 17 05/19/2021 0306   LDLCALC 65 05/19/2021 0306   LDLCALC 154 (H) 07/30/2019 0826   LDLDIRECT 152.5 10/15/2013 1131       Other studies Reviewed: Additional studies/ records that were reviewed today include: ***. Echo from 03/26/21:    1. Left ventricular ejection fraction, by estimation, is 50 to 55%. The  left ventricle has low normal function. The left ventricle demonstrates  regional wall motion  abnormalities (see scoring diagram/findings for  description). There is mild asymmetric  left ventricular hypertrophy of the basal-septal segment. Left ventricular  diastolic parameters are consistent with Grade I diastolic dysfunction  (impaired relaxation).   2. Right ventricular systolic function is normal. The right ventricular  size is mildly enlarged. Tricuspid regurgitation signal is inadequate for  assessing PA pressure.   3. The mitral valve is grossly normal. Trivial mitral valve  regurgitation. No evidence of mitral stenosis.   4. The aortic valve is tricuspid. Aortic valve regurgitation is not  visualized. Mild to moderate aortic valve sclerosis/calcification is  present, without any evidence of aortic stenosis.   5. The inferior vena cava is normal in size  with greater than 50%  respiratory variability, suggesting right atrial pressure of 3 mmHg.   Comparison(s): Changes from prior study are noted. EF 50-55%. WMAs  described above. _____________   Left cardiac cath on 03/25/21:   CULPRIT LESION Ost LAD to Mid LAD lesion is 100% stenosed. (Very late stent thrombosis in the setting of known in-stent restenosis) Balloon angioplasty was performed using a BALLOON SAPPHIRE 3.0X15 -> followed by a BALLOON SAPPHIRE [place] 3.75X15. Post intervention, there is a ~0% residual stenosis.-No need for additional stent ----------------------------- Dist Cx lesion is 60% stenosed with 60% stenosed side branch in 3rd Mrg. ----------------------------- LV end diastolic pressure is normal. There is no aortic valve stenosis.   SUMMARY Severe single-vessel disease with 100% thrombotic occlusion of the ostial to proximal LAD stent treated successfully with balloon angioplasty restoring TIMI-3 flow. Moderate residual distal LCx disease-treat medically. Normal LVEDP-LV gram not performed     RECOMMENDATIONS Admit to  CVICU but likely transfer out tomorrow, depending results of echocardiogram,  could consider fast-track discharge. He has apparently been on aspirin and Plavix because of a stroke, would continue Was also apparently started on statin, will continue Lipitor 40 mg Otherwise has been very reluctant to take other medications.  We will hold off for now.   Diagnostic Dominance: Right     Intervention        ASSESSMENT AND PLAN:  1.  ***   Current medicines are reviewed with the patient today.  The patient Has no concerns regarding medicines.  The following changes have been made:  See above Labs/ tests ordered today include:see above  Disposition:   FU:  see above  Signed, Nada Boozer, NP  07/17/2021 12:34 PM    [facility] [address] [place], [place], [place]  [address]/ [address] [employer] [employer] [address] [place], [place] Phone: [phone]; Fax: [phone]  [phone]

## 2021-07-20 DIAGNOSIS — I639 Cerebral infarction, unspecified: Secondary | ICD-10-CM | POA: Diagnosis not present

## 2021-07-20 DIAGNOSIS — H9190 Unspecified hearing loss, unspecified ear: Secondary | ICD-10-CM | POA: Diagnosis not present

## 2021-07-20 DIAGNOSIS — N400 Enlarged prostate without lower urinary tract symptoms: Secondary | ICD-10-CM | POA: Diagnosis not present

## 2021-07-20 DIAGNOSIS — R6889 Other general symptoms and signs: Secondary | ICD-10-CM | POA: Diagnosis not present

## 2021-07-23 ENCOUNTER — Inpatient Hospital Stay: Payer: Medicare Other | Admitting: Adult Health

## 2021-08-01 DIAGNOSIS — U071 COVID-19: Secondary | ICD-10-CM | POA: Diagnosis not present

## 2021-08-01 DIAGNOSIS — Z28310 Unvaccinated for covid-19: Secondary | ICD-10-CM | POA: Diagnosis not present

## 2021-08-01 DIAGNOSIS — B9729 Other coronavirus as the cause of diseases classified elsewhere: Secondary | ICD-10-CM | POA: Diagnosis not present

## 2021-08-01 DIAGNOSIS — J029 Acute pharyngitis, unspecified: Secondary | ICD-10-CM | POA: Diagnosis not present

## 2021-08-01 DIAGNOSIS — T7840XA Allergy, unspecified, initial encounter: Secondary | ICD-10-CM | POA: Diagnosis not present

## 2021-08-13 IMAGING — MR MR MRA NECK WO/W CM
5 of 8 series · 28 of 48 positions shown · IV contrast (Gadavist)
Comparison: MRI head February 15, 2021.

CLINICAL DATA: Neuro deficit, acute stroke suspected.

EXAM:
MRA NECK WITHOUT AND WITH CONTRAST
MRA HEAD WITHOUT CONTRAST
TECHNIQUE: Multiplanar and multiecho pulse sequences of the neck were obtained
without and with intravenous contrast. Angiographic images of the
neck were obtained using MRA technique without and with intravenous
contrast; Angiographic images of the Circle of Willis were obtained
using MRA technique without intravenous contrast.
CONTRAST:  7.5mL GADAVIST GADOBUTROL 1 MMOL/ML IV SOLN

[Series 7: tof_fl3d_tra_iso · axial · B · 0.6mm · 0.52mm/px · z∈[-85,-6]mm · 8 of 133 slices shown]
[im 1/133]
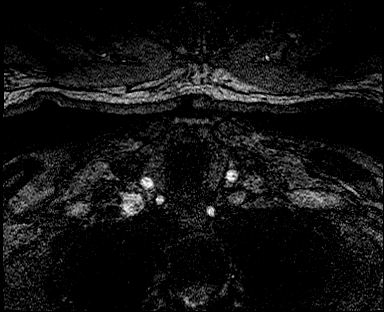
[im 19/133]
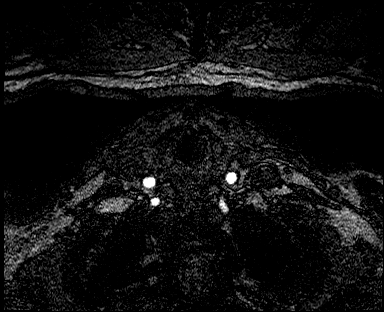
[im 38/133]
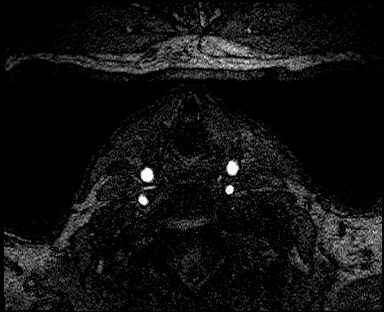
[im 57/133]
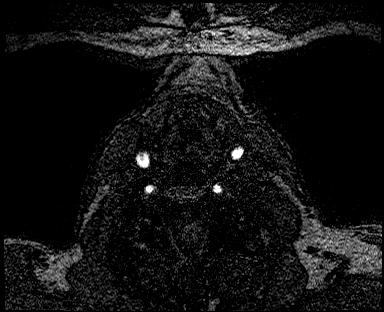
[im 76/133]
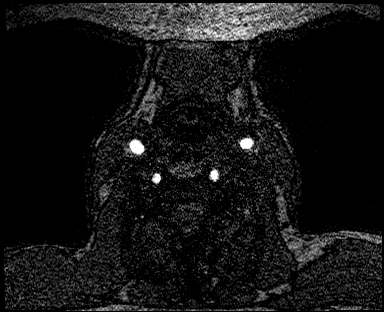
[im 95/133]
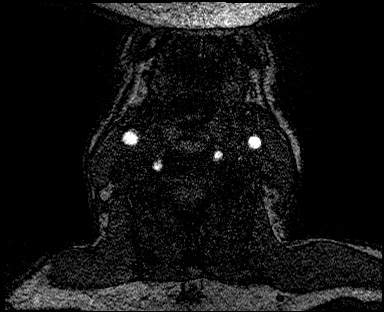
[im 114/133]
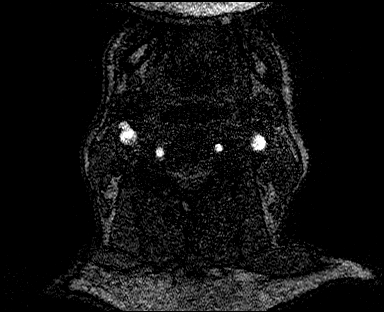
[im 133/133]
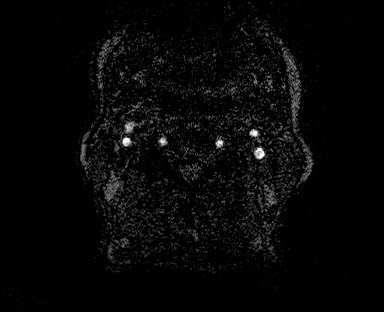

[Series 10: angio_fl3d_cor_highres_pre_ttc=3.0s · coronal · B · 0.9mm · 0.62mm/px · 5 of 80 slices shown]
[im 1/80]
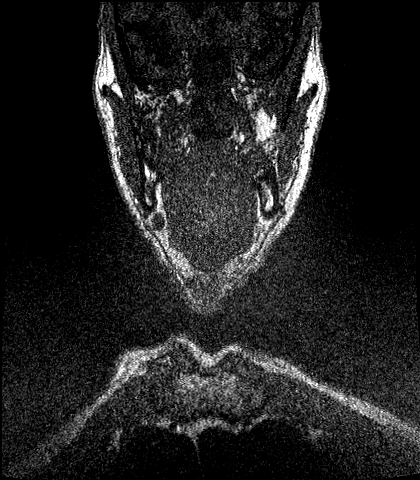
[im 20/80]
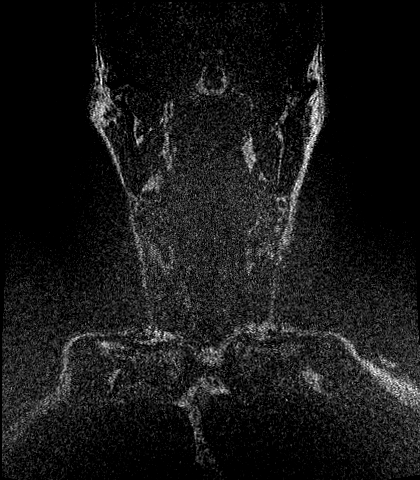
[im 40/80]
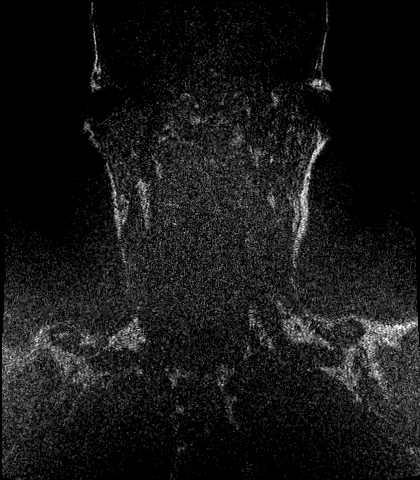
[im 60/80]
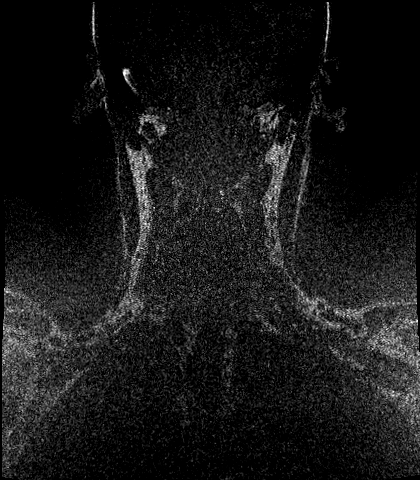
[im 80/80]
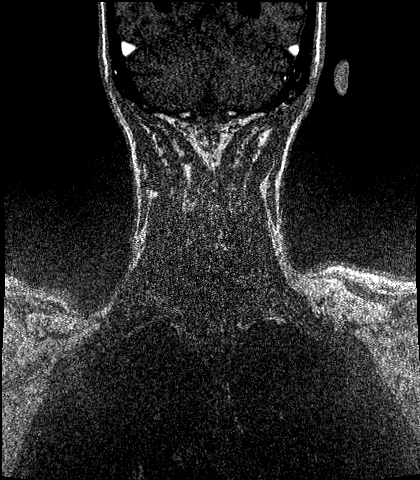

[Series 12: angio_fl3d_cor_highres_post_ttc=3.0s · coronal · B · 0.9mm · 0.62mm/px · 5 of 80 slices shown]
[im 1/80]
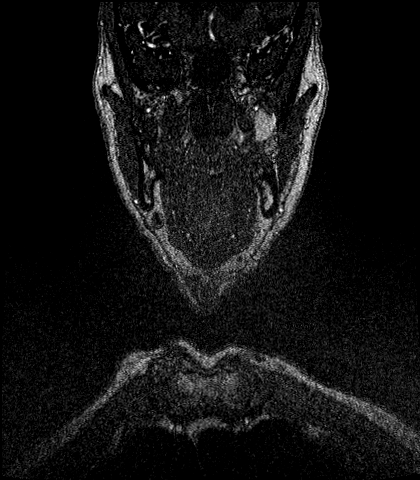
[im 20/80]
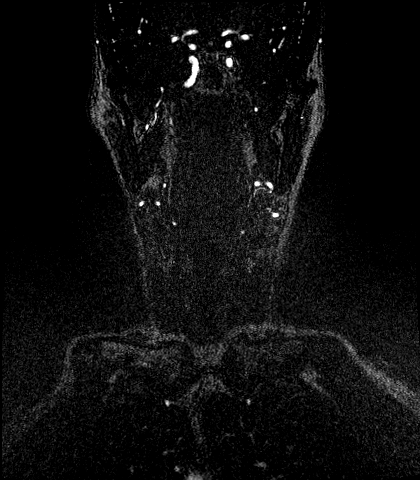
[im 40/80]
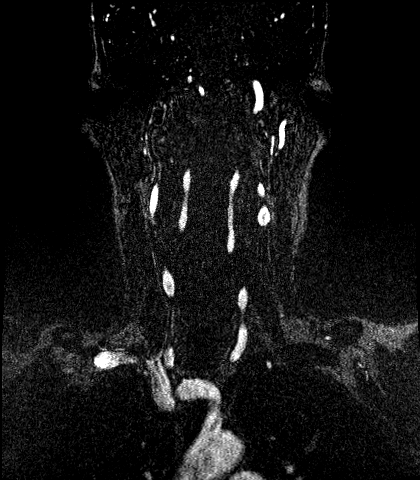
[im 60/80]
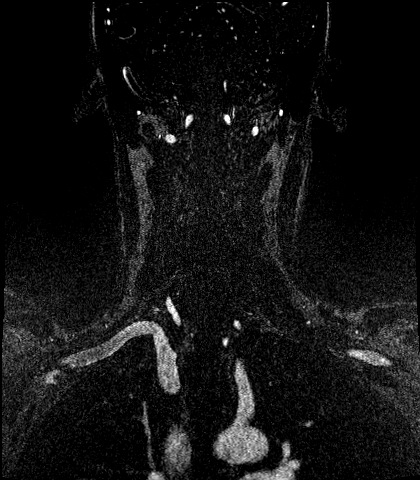
[im 80/80]
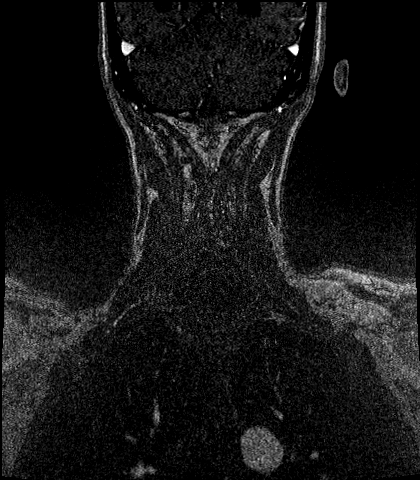

[Series 13: angio_fl3d_cor_highres_post_ttc=3.0s_moco-adv · coronal · B · 0.9mm · 0.62mm/px · 6 of 80 slices shown]
[im 1/80]
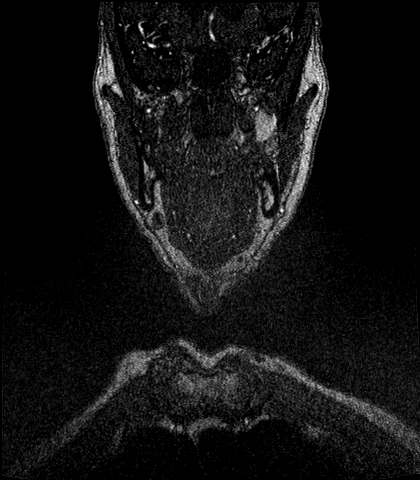
[im 16/80]
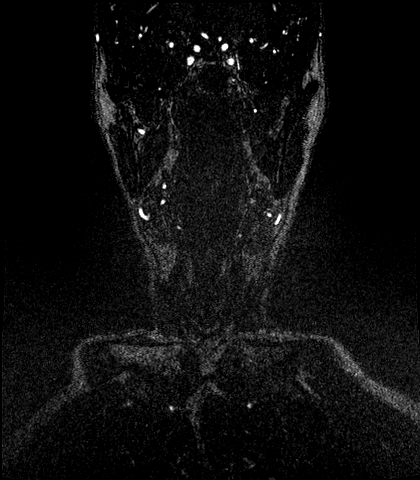
[im 32/80]
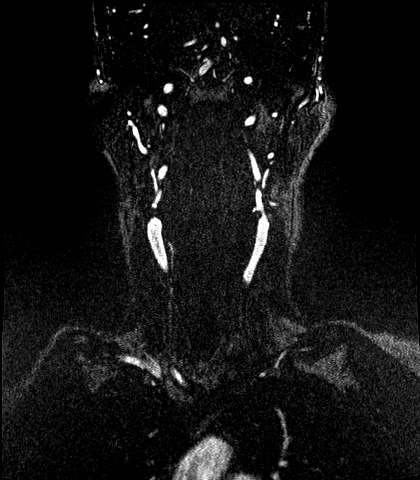
[im 48/80]
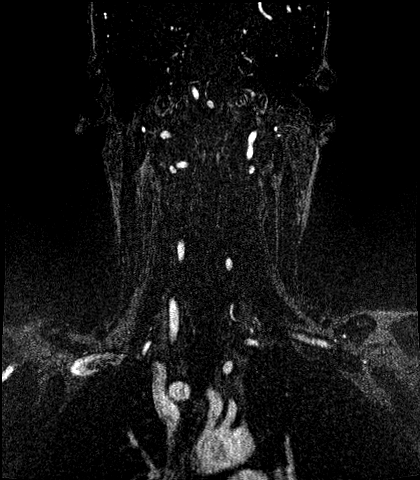
[im 64/80]
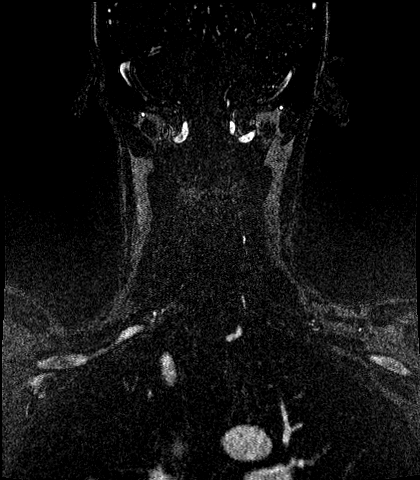
[im 80/80]
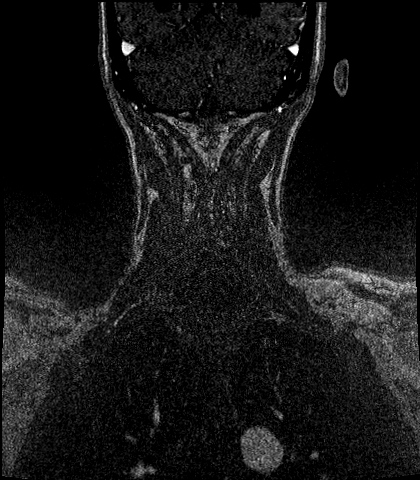

[Series 14: angio_fl3d_cor_highres_post_ttc=3.0s_moco-adv_sub · coronal · B · 0.9mm · 0.62mm/px · 4 of 80 slices shown]
[im 1/80]
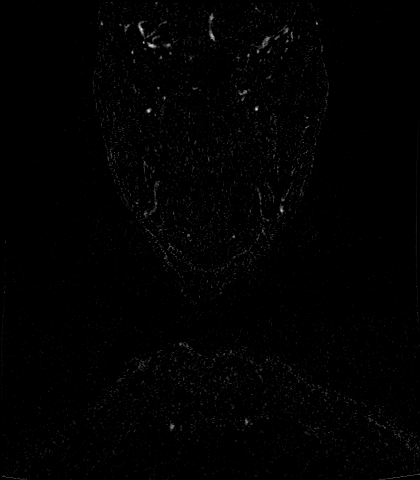
[im 16/80]
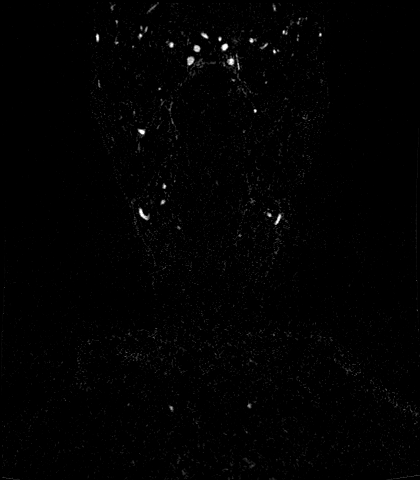
[im 32/80]
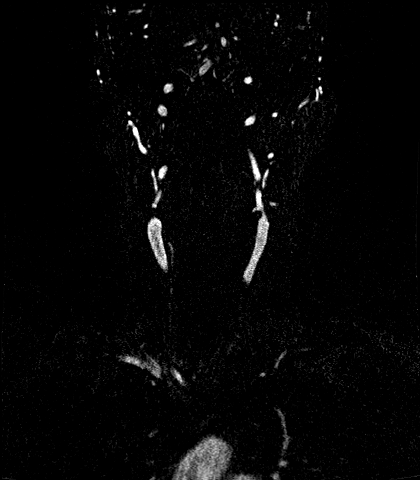
[im 48/80]
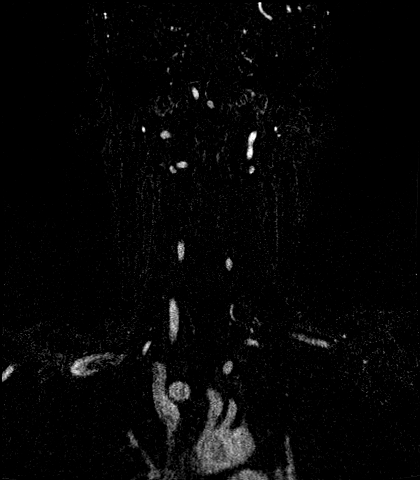

[28 of 48 positions shown; findings below may reference images not displayed]

FINDINGS: Mildly motion limited exam in the head and neck.

MRA NECK FINDINGS

Great vessel origins are patent. Bilateral common carotid and
internal carotid arteries are patent. Mild narrowing at the carotid
bifurcations without greater than 50% stenosis. Codominant vertebral
arteries which are patent without evidence of significant (greater
than 50%) stenosis.

MRA HEAD FINDINGS

Anterior circulation: No large vessel occlusion, proximal
hemodynamically significant stenosis or visible aneurysm.

Posterior circulation: No large vessel occlusion. Irregular basilar
artery, likely related to atherosclerosis. Mild to moderate
(approximately 30-40%)a stenosis of the mid basilar artery.
Bilateral posterior cerebral arteries are patent. Suspected severe
stenosis of the distal left P2 PCA.
IMPRESSION: MRA Headl:

1. No large vessel intracranial occlusion.
2. Suspected severe left distal P2 PCA stenosis.
3. Mild to moderate (approximately 30-40%) stenosis of the mid
basilar artery.

MRA Neck:

No visible hemodynamically significant stenosis.

## 2021-09-10 DIAGNOSIS — H4052X3 Glaucoma secondary to other eye disorders, left eye, severe stage: Secondary | ICD-10-CM | POA: Diagnosis not present

## 2021-09-15 DIAGNOSIS — H34812 Central retinal vein occlusion, left eye, with macular edema: Secondary | ICD-10-CM | POA: Diagnosis not present

## 2021-09-15 DIAGNOSIS — H4052X3 Glaucoma secondary to other eye disorders, left eye, severe stage: Secondary | ICD-10-CM | POA: Diagnosis not present

## 2021-09-23 DIAGNOSIS — N3281 Overactive bladder: Secondary | ICD-10-CM | POA: Diagnosis not present

## 2021-09-23 DIAGNOSIS — Z7982 Long term (current) use of aspirin: Secondary | ICD-10-CM | POA: Diagnosis not present

## 2021-09-23 DIAGNOSIS — Z7902 Long term (current) use of antithrombotics/antiplatelets: Secondary | ICD-10-CM | POA: Diagnosis not present

## 2021-09-23 DIAGNOSIS — R7303 Prediabetes: Secondary | ICD-10-CM | POA: Diagnosis not present

## 2021-09-23 DIAGNOSIS — Z8669 Personal history of other diseases of the nervous system and sense organs: Secondary | ICD-10-CM | POA: Diagnosis not present

## 2021-09-23 DIAGNOSIS — I2510 Atherosclerotic heart disease of native coronary artery without angina pectoris: Secondary | ICD-10-CM | POA: Diagnosis not present

## 2021-09-23 DIAGNOSIS — Z7689 Persons encountering health services in other specified circumstances: Secondary | ICD-10-CM | POA: Diagnosis not present

## 2021-09-23 DIAGNOSIS — I6350 Cerebral infarction due to unspecified occlusion or stenosis of unspecified cerebral artery: Secondary | ICD-10-CM | POA: Diagnosis not present

## 2021-10-05 ENCOUNTER — Ambulatory Visit: Payer: Medicare Other | Admitting: Adult Health

## 2021-10-16 DIAGNOSIS — H34812 Central retinal vein occlusion, left eye, with macular edema: Secondary | ICD-10-CM | POA: Diagnosis not present

## 2021-10-22 DIAGNOSIS — I10 Essential (primary) hypertension: Secondary | ICD-10-CM | POA: Diagnosis not present

## 2021-10-22 DIAGNOSIS — I6350 Cerebral infarction due to unspecified occlusion or stenosis of unspecified cerebral artery: Secondary | ICD-10-CM | POA: Diagnosis not present

## 2021-10-22 DIAGNOSIS — I2510 Atherosclerotic heart disease of native coronary artery without angina pectoris: Secondary | ICD-10-CM | POA: Diagnosis not present

## 2021-10-22 DIAGNOSIS — E782 Mixed hyperlipidemia: Secondary | ICD-10-CM | POA: Diagnosis not present

## 2021-10-26 DIAGNOSIS — I63431 Cerebral infarction due to embolism of right posterior cerebral artery: Secondary | ICD-10-CM | POA: Diagnosis not present

## 2021-10-26 DIAGNOSIS — I639 Cerebral infarction, unspecified: Secondary | ICD-10-CM | POA: Diagnosis not present

## 2021-11-12 DIAGNOSIS — I63131 Cerebral infarction due to embolism of right carotid artery: Secondary | ICD-10-CM | POA: Diagnosis not present

## 2021-11-16 DIAGNOSIS — H4052X3 Glaucoma secondary to other eye disorders, left eye, severe stage: Secondary | ICD-10-CM | POA: Diagnosis not present

## 2021-12-29 DIAGNOSIS — H401113 Primary open-angle glaucoma, right eye, severe stage: Secondary | ICD-10-CM | POA: Diagnosis not present

## 2022-01-05 DIAGNOSIS — I6521 Occlusion and stenosis of right carotid artery: Secondary | ICD-10-CM | POA: Diagnosis not present

## 2022-01-05 DIAGNOSIS — I6389 Other cerebral infarction: Secondary | ICD-10-CM | POA: Diagnosis not present

## 2022-01-05 DIAGNOSIS — E785 Hyperlipidemia, unspecified: Secondary | ICD-10-CM | POA: Diagnosis not present

## 2022-01-05 DIAGNOSIS — E119 Type 2 diabetes mellitus without complications: Secondary | ICD-10-CM | POA: Diagnosis not present

## 2022-01-05 DIAGNOSIS — Z0189 Encounter for other specified special examinations: Secondary | ICD-10-CM | POA: Diagnosis not present

## 2022-01-05 DIAGNOSIS — I2510 Atherosclerotic heart disease of native coronary artery without angina pectoris: Secondary | ICD-10-CM | POA: Diagnosis not present

## 2022-01-05 DIAGNOSIS — I639 Cerebral infarction, unspecified: Secondary | ICD-10-CM | POA: Diagnosis not present

## 2022-01-05 DIAGNOSIS — I10 Essential (primary) hypertension: Secondary | ICD-10-CM | POA: Diagnosis not present

## 2022-01-05 DIAGNOSIS — Z8673 Personal history of transient ischemic attack (TIA), and cerebral infarction without residual deficits: Secondary | ICD-10-CM | POA: Diagnosis not present

## 2022-01-08 DIAGNOSIS — N3281 Overactive bladder: Secondary | ICD-10-CM | POA: Diagnosis not present

## 2022-01-08 DIAGNOSIS — R350 Frequency of micturition: Secondary | ICD-10-CM | POA: Diagnosis not present

## 2022-01-27 ENCOUNTER — Encounter: Payer: Self-pay | Admitting: Interventional Cardiology

## 2022-01-27 ENCOUNTER — Telehealth: Payer: Self-pay | Admitting: Interventional Cardiology

## 2022-01-27 NOTE — Telephone Encounter (Signed)
Spoke with patient's daughter [name] (OK per DPR). She is asking what patient's stent is made of. ? ?Per Dr. Katrinka Blazing the stent used for patient is a metal alloy, chromium cobalt. ? ?Patient would like to try a homeopathic treatment for urinary incontinence which involves the use of magnets. They are asking if this would be safe to pursue with the stent. ? ? ?

## 2022-01-27 NOTE — Telephone Encounter (Signed)
New Message: ? ? ? ? ? ?Patient's daughter called. She wanted to know what is the stent that the patient have made of? He needs to know this asap please, another needs to know. ?

## 2022-01-29 NOTE — Telephone Encounter (Signed)
Follow-up response to homeopathic treatment made via reply to patient's MyChart message from 01/27/22 (see this message for details). ?

## 2022-03-25 DIAGNOSIS — H903 Sensorineural hearing loss, bilateral: Secondary | ICD-10-CM | POA: Diagnosis not present

## 2022-04-01 DIAGNOSIS — I10 Essential (primary) hypertension: Secondary | ICD-10-CM | POA: Diagnosis not present

## 2022-04-01 DIAGNOSIS — D649 Anemia, unspecified: Secondary | ICD-10-CM | POA: Diagnosis not present

## 2022-04-01 DIAGNOSIS — I2510 Atherosclerotic heart disease of native coronary artery without angina pectoris: Secondary | ICD-10-CM | POA: Diagnosis not present

## 2022-04-06 DIAGNOSIS — H401113 Primary open-angle glaucoma, right eye, severe stage: Secondary | ICD-10-CM | POA: Diagnosis not present

## 2022-04-06 DIAGNOSIS — H35031 Hypertensive retinopathy, right eye: Secondary | ICD-10-CM | POA: Diagnosis not present

## 2022-04-06 DIAGNOSIS — H4052X3 Glaucoma secondary to other eye disorders, left eye, severe stage: Secondary | ICD-10-CM | POA: Diagnosis not present

## 2022-04-06 DIAGNOSIS — H34812 Central retinal vein occlusion, left eye, with macular edema: Secondary | ICD-10-CM | POA: Diagnosis not present

## 2022-04-21 DIAGNOSIS — E782 Mixed hyperlipidemia: Secondary | ICD-10-CM | POA: Diagnosis not present

## 2022-04-21 DIAGNOSIS — I6350 Cerebral infarction due to unspecified occlusion or stenosis of unspecified cerebral artery: Secondary | ICD-10-CM | POA: Diagnosis not present

## 2022-04-21 DIAGNOSIS — I10 Essential (primary) hypertension: Secondary | ICD-10-CM | POA: Diagnosis not present

## 2022-04-21 DIAGNOSIS — I2510 Atherosclerotic heart disease of native coronary artery without angina pectoris: Secondary | ICD-10-CM | POA: Diagnosis not present

## 2022-05-08 DIAGNOSIS — R42 Dizziness and giddiness: Secondary | ICD-10-CM | POA: Diagnosis not present

## 2022-05-08 DIAGNOSIS — R531 Weakness: Secondary | ICD-10-CM | POA: Diagnosis not present

## 2022-06-01 DIAGNOSIS — H401113 Primary open-angle glaucoma, right eye, severe stage: Secondary | ICD-10-CM | POA: Diagnosis not present

## 2022-06-15 ENCOUNTER — Telehealth: Payer: Self-pay | Admitting: Interventional Cardiology

## 2022-06-15 MED ORDER — ATORVASTATIN CALCIUM 40 MG PO TABS: 40.0000 mg | ORAL_TABLET | Freq: Every day | ORAL | 0 refills | Status: AC

## 2022-06-15 MED ORDER — TICAGRELOR 90 MG PO TABS: 90.0000 mg | ORAL_TABLET | Freq: Two times a day (BID) | ORAL | 0 refills | Status: AC

## 2022-06-15 MED ORDER — ATORVASTATIN CALCIUM 40 MG PO TABS: 40.0000 mg | ORAL_TABLET | Freq: Every day | ORAL | 0 refills | Status: DC

## 2022-06-15 MED ORDER — TICAGRELOR 90 MG PO TABS: 90.0000 mg | ORAL_TABLET | Freq: Two times a day (BID) | ORAL | 0 refills | Status: DC

## 2022-06-15 NOTE — Telephone Encounter (Signed)
Pt's medications were sent to pt's pharmacy as requested. Confirmation received.  

## 2022-06-15 NOTE — Addendum Note (Signed)
Addended by: Carter Kitten D on: 06/15/2022 04:16 PM   Modules accepted: Orders

## 2022-06-15 NOTE — Telephone Encounter (Signed)
*  STAT* If patient is at the pharmacy, call can be transferred to refill team.   1. Which medications need to be refilled? (please list name of each medication and dose if known)   atorvastatin (LIPITOR) 40 MG tablet ticagrelor (BRILINTA) 90 MG TABS tablet  2. Which pharmacy/location (including street and city if local pharmacy) is medication to be sent to? [facility] [place] [place] to [facility]  3. Do they need a 30 day or 90 day supply? 30 day

## 2022-06-17 DIAGNOSIS — H34812 Central retinal vein occlusion, left eye, with macular edema: Secondary | ICD-10-CM | POA: Diagnosis not present

## 2022-09-10 DIAGNOSIS — H401113 Primary open-angle glaucoma, right eye, severe stage: Secondary | ICD-10-CM | POA: Diagnosis not present

## 2022-09-15 DIAGNOSIS — H401113 Primary open-angle glaucoma, right eye, severe stage: Secondary | ICD-10-CM | POA: Diagnosis not present

## 2022-09-19 DIAGNOSIS — M25471 Effusion, right ankle: Secondary | ICD-10-CM | POA: Diagnosis not present

## 2022-09-19 DIAGNOSIS — I2510 Atherosclerotic heart disease of native coronary artery without angina pectoris: Secondary | ICD-10-CM | POA: Diagnosis not present

## 2022-09-19 DIAGNOSIS — M79671 Pain in right foot: Secondary | ICD-10-CM | POA: Diagnosis not present

## 2022-09-19 DIAGNOSIS — M25571 Pain in right ankle and joints of right foot: Secondary | ICD-10-CM | POA: Diagnosis not present

## 2022-09-19 DIAGNOSIS — M958 Other specified acquired deformities of musculoskeletal system: Secondary | ICD-10-CM | POA: Diagnosis not present

## 2022-09-19 DIAGNOSIS — R2241 Localized swelling, mass and lump, right lower limb: Secondary | ICD-10-CM | POA: Diagnosis not present

## 2022-09-21 DIAGNOSIS — M93271 Osteochondritis dissecans, right ankle and joints of right foot: Secondary | ICD-10-CM | POA: Diagnosis not present

## 2022-09-21 DIAGNOSIS — M79671 Pain in right foot: Secondary | ICD-10-CM | POA: Diagnosis not present

## 2022-09-21 DIAGNOSIS — R7303 Prediabetes: Secondary | ICD-10-CM | POA: Diagnosis not present

## 2022-10-06 DIAGNOSIS — M899 Disorder of bone, unspecified: Secondary | ICD-10-CM | POA: Diagnosis not present

## 2022-10-06 DIAGNOSIS — Z87891 Personal history of nicotine dependence: Secondary | ICD-10-CM | POA: Diagnosis not present

## 2022-10-08 DIAGNOSIS — H401113 Primary open-angle glaucoma, right eye, severe stage: Secondary | ICD-10-CM | POA: Diagnosis not present

## 2022-10-25 DIAGNOSIS — M5459 Other low back pain: Secondary | ICD-10-CM | POA: Diagnosis not present

## 2022-10-25 DIAGNOSIS — M47816 Spondylosis without myelopathy or radiculopathy, lumbar region: Secondary | ICD-10-CM | POA: Diagnosis not present

## 2022-10-25 DIAGNOSIS — M7671 Peroneal tendinitis, right leg: Secondary | ICD-10-CM | POA: Diagnosis not present

## 2022-10-25 DIAGNOSIS — M5136 Other intervertebral disc degeneration, lumbar region: Secondary | ICD-10-CM | POA: Diagnosis not present

## 2022-10-27 DIAGNOSIS — H34812 Central retinal vein occlusion, left eye, with macular edema: Secondary | ICD-10-CM | POA: Diagnosis not present

## 2022-10-27 DIAGNOSIS — H35031 Hypertensive retinopathy, right eye: Secondary | ICD-10-CM | POA: Diagnosis not present

## 2022-10-28 DIAGNOSIS — M5136 Other intervertebral disc degeneration, lumbar region: Secondary | ICD-10-CM | POA: Diagnosis not present

## 2022-10-28 DIAGNOSIS — M6281 Muscle weakness (generalized): Secondary | ICD-10-CM | POA: Diagnosis not present

## 2022-10-28 DIAGNOSIS — M47816 Spondylosis without myelopathy or radiculopathy, lumbar region: Secondary | ICD-10-CM | POA: Diagnosis not present

## 2022-10-28 DIAGNOSIS — M5442 Lumbago with sciatica, left side: Secondary | ICD-10-CM | POA: Diagnosis not present

## 2022-11-03 DIAGNOSIS — M47816 Spondylosis without myelopathy or radiculopathy, lumbar region: Secondary | ICD-10-CM | POA: Diagnosis not present

## 2022-11-03 DIAGNOSIS — M6281 Muscle weakness (generalized): Secondary | ICD-10-CM | POA: Diagnosis not present

## 2022-11-03 DIAGNOSIS — M5442 Lumbago with sciatica, left side: Secondary | ICD-10-CM | POA: Diagnosis not present

## 2022-11-03 DIAGNOSIS — M5136 Other intervertebral disc degeneration, lumbar region: Secondary | ICD-10-CM | POA: Diagnosis not present

## 2022-11-05 DIAGNOSIS — M5442 Lumbago with sciatica, left side: Secondary | ICD-10-CM | POA: Diagnosis not present

## 2022-11-05 DIAGNOSIS — M6281 Muscle weakness (generalized): Secondary | ICD-10-CM | POA: Diagnosis not present

## 2022-11-05 DIAGNOSIS — M5136 Other intervertebral disc degeneration, lumbar region: Secondary | ICD-10-CM | POA: Diagnosis not present

## 2022-11-05 DIAGNOSIS — M47816 Spondylosis without myelopathy or radiculopathy, lumbar region: Secondary | ICD-10-CM | POA: Diagnosis not present

## 2022-11-09 DIAGNOSIS — M5442 Lumbago with sciatica, left side: Secondary | ICD-10-CM | POA: Diagnosis not present

## 2022-11-09 DIAGNOSIS — M47816 Spondylosis without myelopathy or radiculopathy, lumbar region: Secondary | ICD-10-CM | POA: Diagnosis not present

## 2022-11-09 DIAGNOSIS — M5136 Other intervertebral disc degeneration, lumbar region: Secondary | ICD-10-CM | POA: Diagnosis not present

## 2022-11-09 DIAGNOSIS — M6281 Muscle weakness (generalized): Secondary | ICD-10-CM | POA: Diagnosis not present

## 2022-11-11 DIAGNOSIS — M5442 Lumbago with sciatica, left side: Secondary | ICD-10-CM | POA: Diagnosis not present

## 2022-11-11 DIAGNOSIS — M5136 Other intervertebral disc degeneration, lumbar region: Secondary | ICD-10-CM | POA: Diagnosis not present

## 2022-11-11 DIAGNOSIS — M6281 Muscle weakness (generalized): Secondary | ICD-10-CM | POA: Diagnosis not present

## 2022-11-11 DIAGNOSIS — M47816 Spondylosis without myelopathy or radiculopathy, lumbar region: Secondary | ICD-10-CM | POA: Diagnosis not present

## 2022-11-15 DIAGNOSIS — M47816 Spondylosis without myelopathy or radiculopathy, lumbar region: Secondary | ICD-10-CM | POA: Diagnosis not present

## 2022-11-15 DIAGNOSIS — M5136 Other intervertebral disc degeneration, lumbar region: Secondary | ICD-10-CM | POA: Diagnosis not present

## 2022-11-15 DIAGNOSIS — M5442 Lumbago with sciatica, left side: Secondary | ICD-10-CM | POA: Diagnosis not present

## 2022-11-15 DIAGNOSIS — M6281 Muscle weakness (generalized): Secondary | ICD-10-CM | POA: Diagnosis not present

## 2022-11-17 DIAGNOSIS — M5442 Lumbago with sciatica, left side: Secondary | ICD-10-CM | POA: Diagnosis not present

## 2022-11-17 DIAGNOSIS — M5136 Other intervertebral disc degeneration, lumbar region: Secondary | ICD-10-CM | POA: Diagnosis not present

## 2022-11-17 DIAGNOSIS — M6281 Muscle weakness (generalized): Secondary | ICD-10-CM | POA: Diagnosis not present

## 2022-11-17 DIAGNOSIS — M47816 Spondylosis without myelopathy or radiculopathy, lumbar region: Secondary | ICD-10-CM | POA: Diagnosis not present

## 2022-11-23 DIAGNOSIS — M5442 Lumbago with sciatica, left side: Secondary | ICD-10-CM | POA: Diagnosis not present

## 2022-11-23 DIAGNOSIS — M6281 Muscle weakness (generalized): Secondary | ICD-10-CM | POA: Diagnosis not present

## 2022-11-23 DIAGNOSIS — M47816 Spondylosis without myelopathy or radiculopathy, lumbar region: Secondary | ICD-10-CM | POA: Diagnosis not present

## 2022-11-23 DIAGNOSIS — M5136 Other intervertebral disc degeneration, lumbar region: Secondary | ICD-10-CM | POA: Diagnosis not present

## 2022-11-25 DIAGNOSIS — M6281 Muscle weakness (generalized): Secondary | ICD-10-CM | POA: Diagnosis not present

## 2022-11-25 DIAGNOSIS — M5136 Other intervertebral disc degeneration, lumbar region: Secondary | ICD-10-CM | POA: Diagnosis not present

## 2022-11-25 DIAGNOSIS — M47816 Spondylosis without myelopathy or radiculopathy, lumbar region: Secondary | ICD-10-CM | POA: Diagnosis not present

## 2022-11-25 DIAGNOSIS — M5442 Lumbago with sciatica, left side: Secondary | ICD-10-CM | POA: Diagnosis not present

## 2022-11-30 DIAGNOSIS — M5442 Lumbago with sciatica, left side: Secondary | ICD-10-CM | POA: Diagnosis not present

## 2022-11-30 DIAGNOSIS — M47816 Spondylosis without myelopathy or radiculopathy, lumbar region: Secondary | ICD-10-CM | POA: Diagnosis not present

## 2022-11-30 DIAGNOSIS — M5136 Other intervertebral disc degeneration, lumbar region: Secondary | ICD-10-CM | POA: Diagnosis not present

## 2022-11-30 DIAGNOSIS — M6281 Muscle weakness (generalized): Secondary | ICD-10-CM | POA: Diagnosis not present

## 2022-12-02 DIAGNOSIS — M6281 Muscle weakness (generalized): Secondary | ICD-10-CM | POA: Diagnosis not present

## 2022-12-02 DIAGNOSIS — M5442 Lumbago with sciatica, left side: Secondary | ICD-10-CM | POA: Diagnosis not present

## 2022-12-02 DIAGNOSIS — M5136 Other intervertebral disc degeneration, lumbar region: Secondary | ICD-10-CM | POA: Diagnosis not present

## 2022-12-02 DIAGNOSIS — M47816 Spondylosis without myelopathy or radiculopathy, lumbar region: Secondary | ICD-10-CM | POA: Diagnosis not present

## 2022-12-08 DIAGNOSIS — M5442 Lumbago with sciatica, left side: Secondary | ICD-10-CM | POA: Diagnosis not present

## 2022-12-08 DIAGNOSIS — M6281 Muscle weakness (generalized): Secondary | ICD-10-CM | POA: Diagnosis not present

## 2022-12-08 DIAGNOSIS — M47816 Spondylosis without myelopathy or radiculopathy, lumbar region: Secondary | ICD-10-CM | POA: Diagnosis not present

## 2022-12-08 DIAGNOSIS — M5136 Other intervertebral disc degeneration, lumbar region: Secondary | ICD-10-CM | POA: Diagnosis not present

## 2022-12-13 DIAGNOSIS — M5136 Other intervertebral disc degeneration, lumbar region: Secondary | ICD-10-CM | POA: Diagnosis not present

## 2022-12-13 DIAGNOSIS — M5442 Lumbago with sciatica, left side: Secondary | ICD-10-CM | POA: Diagnosis not present

## 2022-12-13 DIAGNOSIS — M6281 Muscle weakness (generalized): Secondary | ICD-10-CM | POA: Diagnosis not present

## 2022-12-13 DIAGNOSIS — M47816 Spondylosis without myelopathy or radiculopathy, lumbar region: Secondary | ICD-10-CM | POA: Diagnosis not present

## 2022-12-16 DIAGNOSIS — M5442 Lumbago with sciatica, left side: Secondary | ICD-10-CM | POA: Diagnosis not present

## 2022-12-16 DIAGNOSIS — M6281 Muscle weakness (generalized): Secondary | ICD-10-CM | POA: Diagnosis not present

## 2022-12-16 DIAGNOSIS — M47816 Spondylosis without myelopathy or radiculopathy, lumbar region: Secondary | ICD-10-CM | POA: Diagnosis not present

## 2022-12-16 DIAGNOSIS — M5136 Other intervertebral disc degeneration, lumbar region: Secondary | ICD-10-CM | POA: Diagnosis not present

## 2022-12-21 DIAGNOSIS — M5442 Lumbago with sciatica, left side: Secondary | ICD-10-CM | POA: Diagnosis not present

## 2022-12-21 DIAGNOSIS — M6281 Muscle weakness (generalized): Secondary | ICD-10-CM | POA: Diagnosis not present

## 2022-12-21 DIAGNOSIS — M5136 Other intervertebral disc degeneration, lumbar region: Secondary | ICD-10-CM | POA: Diagnosis not present

## 2022-12-21 DIAGNOSIS — M47816 Spondylosis without myelopathy or radiculopathy, lumbar region: Secondary | ICD-10-CM | POA: Diagnosis not present

## 2022-12-24 DIAGNOSIS — M5442 Lumbago with sciatica, left side: Secondary | ICD-10-CM | POA: Diagnosis not present

## 2022-12-24 DIAGNOSIS — M6281 Muscle weakness (generalized): Secondary | ICD-10-CM | POA: Diagnosis not present

## 2022-12-24 DIAGNOSIS — M5136 Other intervertebral disc degeneration, lumbar region: Secondary | ICD-10-CM | POA: Diagnosis not present

## 2022-12-24 DIAGNOSIS — M47816 Spondylosis without myelopathy or radiculopathy, lumbar region: Secondary | ICD-10-CM | POA: Diagnosis not present

## 2022-12-27 DIAGNOSIS — M5136 Other intervertebral disc degeneration, lumbar region: Secondary | ICD-10-CM | POA: Diagnosis not present

## 2022-12-27 DIAGNOSIS — M6281 Muscle weakness (generalized): Secondary | ICD-10-CM | POA: Diagnosis not present

## 2022-12-27 DIAGNOSIS — M5442 Lumbago with sciatica, left side: Secondary | ICD-10-CM | POA: Diagnosis not present

## 2022-12-27 DIAGNOSIS — M47816 Spondylosis without myelopathy or radiculopathy, lumbar region: Secondary | ICD-10-CM | POA: Diagnosis not present

## 2023-01-04 DIAGNOSIS — M5136 Other intervertebral disc degeneration, lumbar region: Secondary | ICD-10-CM | POA: Diagnosis not present

## 2023-01-04 DIAGNOSIS — M6281 Muscle weakness (generalized): Secondary | ICD-10-CM | POA: Diagnosis not present

## 2023-01-04 DIAGNOSIS — M47816 Spondylosis without myelopathy or radiculopathy, lumbar region: Secondary | ICD-10-CM | POA: Diagnosis not present

## 2023-01-04 DIAGNOSIS — M5442 Lumbago with sciatica, left side: Secondary | ICD-10-CM | POA: Diagnosis not present

## 2023-01-10 DIAGNOSIS — M5442 Lumbago with sciatica, left side: Secondary | ICD-10-CM | POA: Diagnosis not present

## 2023-01-10 DIAGNOSIS — M47816 Spondylosis without myelopathy or radiculopathy, lumbar region: Secondary | ICD-10-CM | POA: Diagnosis not present

## 2023-01-10 DIAGNOSIS — M6281 Muscle weakness (generalized): Secondary | ICD-10-CM | POA: Diagnosis not present

## 2023-01-10 DIAGNOSIS — M5136 Other intervertebral disc degeneration, lumbar region: Secondary | ICD-10-CM | POA: Diagnosis not present

## 2023-01-13 DIAGNOSIS — M5442 Lumbago with sciatica, left side: Secondary | ICD-10-CM | POA: Diagnosis not present

## 2023-01-13 DIAGNOSIS — M5136 Other intervertebral disc degeneration, lumbar region: Secondary | ICD-10-CM | POA: Diagnosis not present

## 2023-01-13 DIAGNOSIS — M47816 Spondylosis without myelopathy or radiculopathy, lumbar region: Secondary | ICD-10-CM | POA: Diagnosis not present

## 2023-01-13 DIAGNOSIS — M6281 Muscle weakness (generalized): Secondary | ICD-10-CM | POA: Diagnosis not present

## 2023-01-17 DIAGNOSIS — M47816 Spondylosis without myelopathy or radiculopathy, lumbar region: Secondary | ICD-10-CM | POA: Diagnosis not present

## 2023-01-17 DIAGNOSIS — M5442 Lumbago with sciatica, left side: Secondary | ICD-10-CM | POA: Diagnosis not present

## 2023-01-17 DIAGNOSIS — M5136 Other intervertebral disc degeneration, lumbar region: Secondary | ICD-10-CM | POA: Diagnosis not present

## 2023-01-17 DIAGNOSIS — M6281 Muscle weakness (generalized): Secondary | ICD-10-CM | POA: Diagnosis not present

## 2023-01-18 DIAGNOSIS — H401113 Primary open-angle glaucoma, right eye, severe stage: Secondary | ICD-10-CM | POA: Diagnosis not present

## 2023-01-18 DIAGNOSIS — Z961 Presence of intraocular lens: Secondary | ICD-10-CM | POA: Diagnosis not present

## 2023-01-18 DIAGNOSIS — H4052X3 Glaucoma secondary to other eye disorders, left eye, severe stage: Secondary | ICD-10-CM | POA: Diagnosis not present

## 2023-01-20 DIAGNOSIS — M47816 Spondylosis without myelopathy or radiculopathy, lumbar region: Secondary | ICD-10-CM | POA: Diagnosis not present

## 2023-01-20 DIAGNOSIS — M6281 Muscle weakness (generalized): Secondary | ICD-10-CM | POA: Diagnosis not present

## 2023-01-20 DIAGNOSIS — M5442 Lumbago with sciatica, left side: Secondary | ICD-10-CM | POA: Diagnosis not present

## 2023-01-20 DIAGNOSIS — M5136 Other intervertebral disc degeneration, lumbar region: Secondary | ICD-10-CM | POA: Diagnosis not present

## 2023-02-16 DIAGNOSIS — H43393 Other vitreous opacities, bilateral: Secondary | ICD-10-CM | POA: Diagnosis not present

## 2023-02-16 DIAGNOSIS — H21562 Pupillary abnormality, left eye: Secondary | ICD-10-CM | POA: Diagnosis not present

## 2023-03-08 DIAGNOSIS — N400 Enlarged prostate without lower urinary tract symptoms: Secondary | ICD-10-CM | POA: Diagnosis not present

## 2023-03-08 DIAGNOSIS — I639 Cerebral infarction, unspecified: Secondary | ICD-10-CM | POA: Diagnosis not present

## 2023-03-08 DIAGNOSIS — N183 Chronic kidney disease, stage 3 unspecified: Secondary | ICD-10-CM | POA: Diagnosis not present

## 2023-03-08 DIAGNOSIS — R197 Diarrhea, unspecified: Secondary | ICD-10-CM | POA: Diagnosis not present

## 2023-03-10 DIAGNOSIS — N401 Enlarged prostate with lower urinary tract symptoms: Secondary | ICD-10-CM | POA: Diagnosis not present

## 2023-03-10 DIAGNOSIS — R197 Diarrhea, unspecified: Secondary | ICD-10-CM | POA: Diagnosis not present

## 2023-03-10 DIAGNOSIS — R270 Ataxia, unspecified: Secondary | ICD-10-CM | POA: Diagnosis not present

## 2023-03-10 DIAGNOSIS — N183 Chronic kidney disease, stage 3 unspecified: Secondary | ICD-10-CM | POA: Diagnosis not present

## 2023-03-11 DIAGNOSIS — M25432 Effusion, left wrist: Secondary | ICD-10-CM | POA: Diagnosis not present

## 2023-03-11 DIAGNOSIS — M109 Gout, unspecified: Secondary | ICD-10-CM | POA: Diagnosis not present

## 2023-03-11 DIAGNOSIS — N189 Chronic kidney disease, unspecified: Secondary | ICD-10-CM | POA: Diagnosis not present

## 2023-03-11 DIAGNOSIS — I2510 Atherosclerotic heart disease of native coronary artery without angina pectoris: Secondary | ICD-10-CM | POA: Diagnosis not present

## 2023-03-11 DIAGNOSIS — E785 Hyperlipidemia, unspecified: Secondary | ICD-10-CM | POA: Diagnosis not present

## 2023-04-08 DIAGNOSIS — N289 Disorder of kidney and ureter, unspecified: Secondary | ICD-10-CM | POA: Diagnosis not present

## 2023-04-08 DIAGNOSIS — R350 Frequency of micturition: Secondary | ICD-10-CM | POA: Diagnosis not present

## 2023-04-08 DIAGNOSIS — R351 Nocturia: Secondary | ICD-10-CM | POA: Diagnosis not present

## 2023-06-09 ENCOUNTER — Telehealth: Payer: Self-pay | Admitting: Family

## 2023-06-09 NOTE — Telephone Encounter (Signed)
This one

## 2023-06-09 NOTE — Telephone Encounter (Signed)
Patient's daughter is calling because they need [place] to send the [place] the patient's medical records. Please advise.
